# Patient Record
Sex: Male | Born: 1935 | Race: White | Hispanic: No | Marital: Married | Smoking: Never smoker
Health system: Southern US, Community
[De-identification: ages and names within clinical notes are randomized; demographics above are authoritative.]

## PROBLEM LIST (undated history)

## (undated) DIAGNOSIS — H2513 Age-related nuclear cataract, bilateral: Secondary | ICD-10-CM

## (undated) DIAGNOSIS — K661 Hemoperitoneum: Secondary | ICD-10-CM

## (undated) DIAGNOSIS — M1990 Unspecified osteoarthritis, unspecified site: Secondary | ICD-10-CM

## (undated) DIAGNOSIS — IMO0001 Reserved for inherently not codable concepts without codable children: Secondary | ICD-10-CM

## (undated) DIAGNOSIS — K5730 Diverticulosis of large intestine without perforation or abscess without bleeding: Secondary | ICD-10-CM

## (undated) DIAGNOSIS — N200 Calculus of kidney: Secondary | ICD-10-CM

## (undated) DIAGNOSIS — Z85820 Personal history of malignant melanoma of skin: Secondary | ICD-10-CM

## (undated) DIAGNOSIS — R1032 Left lower quadrant pain: Secondary | ICD-10-CM

## (undated) DIAGNOSIS — N402 Nodular prostate without lower urinary tract symptoms: Secondary | ICD-10-CM

## (undated) DIAGNOSIS — C439 Malignant melanoma of skin, unspecified: Secondary | ICD-10-CM

## (undated) DIAGNOSIS — I82409 Acute embolism and thrombosis of unspecified deep veins of unspecified lower extremity: Secondary | ICD-10-CM

## (undated) DIAGNOSIS — E785 Hyperlipidemia, unspecified: Secondary | ICD-10-CM

## (undated) DIAGNOSIS — I2510 Atherosclerotic heart disease of native coronary artery without angina pectoris: Secondary | ICD-10-CM

## (undated) DIAGNOSIS — I639 Cerebral infarction, unspecified: Secondary | ICD-10-CM

## (undated) DIAGNOSIS — I219 Acute myocardial infarction, unspecified: Secondary | ICD-10-CM

## (undated) DIAGNOSIS — I10 Essential (primary) hypertension: Secondary | ICD-10-CM

## (undated) HISTORY — DX: Unspecified osteoarthritis, unspecified site: M19.90

## (undated) HISTORY — DX: Hyperlipidemia, unspecified: E78.5

## (undated) HISTORY — DX: Left lower quadrant pain: R10.32

## (undated) HISTORY — DX: Nodular prostate without lower urinary tract symptoms: N40.2

## (undated) HISTORY — DX: Malignant melanoma of skin, unspecified: C43.9

## (undated) HISTORY — PX: OTHER SURGICAL HISTORY: SHX169

## (undated) HISTORY — DX: Atherosclerotic heart disease of native coronary artery without angina pectoris: I25.10

## (undated) HISTORY — PX: ROTATOR CUFF REPAIR: SHX139

## (undated) HISTORY — PX: CATARACT EXTRACTION: SUR2

## (undated) HISTORY — DX: Acute embolism and thrombosis of unspecified deep veins of unspecified lower extremity: I82.409

## (undated) HISTORY — PX: CARDIOVASCULAR STRESS TEST: SHX262

## (undated) HISTORY — PX: BILATERAL ANTERIOR TOTAL HIP ARTHROPLASTY: SHX5567

## (undated) HISTORY — DX: Personal history of malignant melanoma of skin: Z85.820

## (undated) HISTORY — DX: Acute myocardial infarction, unspecified: I21.9

## (undated) HISTORY — DX: Calculus of kidney: N20.0

## (undated) HISTORY — DX: Age-related nuclear cataract, bilateral: H25.13

## (undated) HISTORY — DX: Cerebral infarction, unspecified: I63.9

## (undated) HISTORY — DX: Essential (primary) hypertension: I10

## (undated) HISTORY — DX: Diverticulosis of large intestine without perforation or abscess without bleeding: K57.30

## (undated) HISTORY — DX: Hemoperitoneum: K66.1

## (undated) HISTORY — DX: Reserved for inherently not codable concepts without codable children: IMO0001

---

## 1988-01-18 HISTORY — PX: OTHER SURGICAL HISTORY: SHX169

## 1990-08-19 HISTORY — PX: MELANOMA EXCISION: SHX5266

## 1999-05-29 ENCOUNTER — Ambulatory Visit (HOSPITAL_BASED_OUTPATIENT_CLINIC_OR_DEPARTMENT_OTHER): Admission: RE | Admit: 1999-05-29 | Discharge: 1999-05-29 | Payer: Self-pay | Admitting: Orthopedic Surgery

## 2006-10-14 DIAGNOSIS — I219 Acute myocardial infarction, unspecified: Secondary | ICD-10-CM

## 2006-10-14 HISTORY — DX: Acute myocardial infarction, unspecified: I21.9

## 2006-10-14 HISTORY — PX: OTHER SURGICAL HISTORY: SHX169

## 2007-08-20 DIAGNOSIS — K661 Hemoperitoneum: Secondary | ICD-10-CM

## 2007-08-20 DIAGNOSIS — K683 Retroperitoneal hematoma: Secondary | ICD-10-CM

## 2007-08-20 DIAGNOSIS — I82409 Acute embolism and thrombosis of unspecified deep veins of unspecified lower extremity: Secondary | ICD-10-CM

## 2007-08-20 HISTORY — DX: Retroperitoneal hematoma: K68.3

## 2007-08-20 HISTORY — DX: Hemoperitoneum: K66.1

## 2007-08-20 HISTORY — DX: Acute embolism and thrombosis of unspecified deep veins of unspecified lower extremity: I82.409

## 2008-05-20 ENCOUNTER — Ambulatory Visit (HOSPITAL_COMMUNITY): Admission: RE | Admit: 2008-05-20 | Discharge: 2008-05-20 | Payer: Self-pay | Admitting: General Surgery

## 2008-05-20 HISTORY — PX: HERNIA REPAIR: SHX51

## 2008-05-23 ENCOUNTER — Encounter: Admission: RE | Admit: 2008-05-23 | Discharge: 2008-05-23 | Payer: Self-pay | Admitting: General Surgery

## 2008-05-24 ENCOUNTER — Inpatient Hospital Stay (HOSPITAL_COMMUNITY): Admission: EM | Admit: 2008-05-24 | Discharge: 2008-05-28 | Payer: Self-pay | Admitting: Emergency Medicine

## 2008-07-25 ENCOUNTER — Encounter: Payer: Self-pay | Admitting: Internal Medicine

## 2008-11-30 DIAGNOSIS — I2510 Atherosclerotic heart disease of native coronary artery without angina pectoris: Secondary | ICD-10-CM | POA: Insufficient documentation

## 2008-12-02 ENCOUNTER — Encounter: Admission: RE | Admit: 2008-12-02 | Discharge: 2008-12-02 | Payer: Self-pay | Admitting: General Surgery

## 2008-12-02 ENCOUNTER — Encounter: Payer: Self-pay | Admitting: Cardiology

## 2008-12-02 ENCOUNTER — Ambulatory Visit: Payer: Self-pay | Admitting: Cardiology

## 2008-12-02 DIAGNOSIS — Z8679 Personal history of other diseases of the circulatory system: Secondary | ICD-10-CM | POA: Insufficient documentation

## 2008-12-02 DIAGNOSIS — Z8672 Personal history of thrombophlebitis: Secondary | ICD-10-CM | POA: Insufficient documentation

## 2008-12-02 DIAGNOSIS — M169 Osteoarthritis of hip, unspecified: Secondary | ICD-10-CM

## 2008-12-02 DIAGNOSIS — Z87442 Personal history of urinary calculi: Secondary | ICD-10-CM

## 2008-12-02 DIAGNOSIS — Z85820 Personal history of malignant melanoma of skin: Secondary | ICD-10-CM

## 2008-12-02 HISTORY — DX: Personal history of malignant melanoma of skin: Z85.820

## 2008-12-07 ENCOUNTER — Ambulatory Visit: Payer: Self-pay | Admitting: Internal Medicine

## 2008-12-07 ENCOUNTER — Telehealth (INDEPENDENT_AMBULATORY_CARE_PROVIDER_SITE_OTHER): Payer: Self-pay | Admitting: *Deleted

## 2008-12-08 ENCOUNTER — Encounter: Payer: Self-pay | Admitting: Cardiovascular Disease

## 2008-12-08 ENCOUNTER — Ambulatory Visit: Payer: Self-pay

## 2008-12-15 ENCOUNTER — Ambulatory Visit: Payer: Self-pay | Admitting: Internal Medicine

## 2009-01-09 ENCOUNTER — Encounter: Payer: Self-pay | Admitting: Internal Medicine

## 2009-01-30 ENCOUNTER — Ambulatory Visit: Payer: Self-pay | Admitting: Internal Medicine

## 2009-02-07 ENCOUNTER — Encounter: Payer: Self-pay | Admitting: Internal Medicine

## 2009-02-07 ENCOUNTER — Ambulatory Visit: Payer: Self-pay | Admitting: Internal Medicine

## 2009-02-07 HISTORY — PX: COLONOSCOPY W/ POLYPECTOMY: SHX1380

## 2009-02-07 LAB — HM COLONOSCOPY: HM Colonoscopy: NORMAL

## 2009-02-12 ENCOUNTER — Encounter: Payer: Self-pay | Admitting: Internal Medicine

## 2009-03-02 ENCOUNTER — Ambulatory Visit: Payer: Self-pay | Admitting: Internal Medicine

## 2009-03-02 ENCOUNTER — Ambulatory Visit: Payer: Self-pay | Admitting: Cardiology

## 2009-03-02 DIAGNOSIS — I739 Peripheral vascular disease, unspecified: Secondary | ICD-10-CM

## 2009-03-02 LAB — CONVERTED CEMR LAB
ALT: 26 units/L (ref 0–53)
AST: 26 units/L (ref 0–37)
Albumin: 3.9 g/dL (ref 3.5–5.2)
BUN: 16 mg/dL (ref 6–23)
CO2: 29 meq/L (ref 19–32)
Chloride: 107 meq/L (ref 96–112)
GFR calc non Af Amer: 99.89 mL/min (ref >60)
Potassium: 4.7 meq/L (ref 3.5–5.1)
Total Protein: 6.7 g/dL (ref 6.0–8.3)
Triglycerides: 153 mg/dL — ABNORMAL HIGH (ref 0.0–149.0)
VLDL: 30.6 mg/dL (ref 0.0–40.0)

## 2009-03-16 ENCOUNTER — Ambulatory Visit: Payer: Self-pay | Admitting: Internal Medicine

## 2009-03-16 DIAGNOSIS — M79609 Pain in unspecified limb: Secondary | ICD-10-CM

## 2009-04-11 ENCOUNTER — Ambulatory Visit: Payer: Self-pay | Admitting: Cardiology

## 2009-04-11 ENCOUNTER — Ambulatory Visit: Payer: Self-pay

## 2009-04-13 ENCOUNTER — Encounter (INDEPENDENT_AMBULATORY_CARE_PROVIDER_SITE_OTHER): Payer: Self-pay | Admitting: *Deleted

## 2009-04-21 ENCOUNTER — Telehealth: Payer: Self-pay | Admitting: Cardiology

## 2009-04-26 ENCOUNTER — Telehealth (INDEPENDENT_AMBULATORY_CARE_PROVIDER_SITE_OTHER): Payer: Self-pay | Admitting: *Deleted

## 2009-06-19 ENCOUNTER — Ambulatory Visit: Payer: Self-pay | Admitting: Internal Medicine

## 2009-07-18 ENCOUNTER — Telehealth: Payer: Self-pay | Admitting: Cardiology

## 2009-07-26 ENCOUNTER — Ambulatory Visit: Payer: Self-pay | Admitting: Cardiology

## 2009-07-26 ENCOUNTER — Encounter: Payer: Self-pay | Admitting: Physician Assistant

## 2009-07-26 DIAGNOSIS — R0602 Shortness of breath: Secondary | ICD-10-CM

## 2009-09-07 ENCOUNTER — Ambulatory Visit: Payer: Self-pay | Admitting: Cardiology

## 2009-09-08 ENCOUNTER — Encounter (INDEPENDENT_AMBULATORY_CARE_PROVIDER_SITE_OTHER): Payer: Self-pay | Admitting: *Deleted

## 2009-09-08 LAB — CONVERTED CEMR LAB
ALT: 24 units/L (ref 0–53)
AST: 22 units/L (ref 0–37)
Alkaline Phosphatase: 60 units/L (ref 39–117)
Bilirubin, Direct: 0 mg/dL (ref 0.0–0.3)
HDL: 44.1 mg/dL (ref >39.00)
VLDL: 39.4 mg/dL (ref 0.0–40.0)

## 2009-09-20 ENCOUNTER — Ambulatory Visit: Payer: Self-pay | Admitting: Internal Medicine

## 2009-09-20 DIAGNOSIS — J309 Allergic rhinitis, unspecified: Secondary | ICD-10-CM | POA: Insufficient documentation

## 2009-12-06 ENCOUNTER — Encounter: Payer: Self-pay | Admitting: Internal Medicine

## 2010-01-16 ENCOUNTER — Telehealth: Payer: Self-pay | Admitting: Cardiology

## 2010-01-30 ENCOUNTER — Encounter: Payer: Self-pay | Admitting: Cardiology

## 2010-01-30 ENCOUNTER — Ambulatory Visit: Payer: Self-pay | Admitting: Cardiology

## 2010-01-30 DIAGNOSIS — R609 Edema, unspecified: Secondary | ICD-10-CM

## 2010-02-05 ENCOUNTER — Encounter: Admission: RE | Admit: 2010-02-05 | Discharge: 2010-02-05 | Payer: Self-pay | Admitting: Cardiology

## 2010-02-05 ENCOUNTER — Encounter: Payer: Self-pay | Admitting: Cardiology

## 2010-02-22 ENCOUNTER — Telehealth (INDEPENDENT_AMBULATORY_CARE_PROVIDER_SITE_OTHER): Payer: Self-pay | Admitting: *Deleted

## 2010-02-22 ENCOUNTER — Encounter: Payer: Self-pay | Admitting: Cardiology

## 2010-02-28 ENCOUNTER — Encounter: Payer: Self-pay | Admitting: Internal Medicine

## 2010-03-13 ENCOUNTER — Inpatient Hospital Stay (HOSPITAL_COMMUNITY): Admission: RE | Admit: 2010-03-13 | Discharge: 2010-03-19 | Payer: Self-pay | Admitting: Orthopedic Surgery

## 2010-04-30 ENCOUNTER — Encounter: Payer: Self-pay | Admitting: Internal Medicine

## 2010-05-21 ENCOUNTER — Telehealth: Payer: Self-pay | Admitting: Cardiology

## 2010-05-23 ENCOUNTER — Encounter: Payer: Self-pay | Admitting: Cardiology

## 2010-05-23 ENCOUNTER — Ambulatory Visit: Payer: Self-pay | Admitting: Cardiology

## 2010-06-11 ENCOUNTER — Encounter: Payer: Self-pay | Admitting: Internal Medicine

## 2010-06-27 ENCOUNTER — Telehealth: Payer: Self-pay | Admitting: Cardiology

## 2010-07-09 ENCOUNTER — Encounter: Payer: Self-pay | Admitting: Internal Medicine

## 2010-07-19 ENCOUNTER — Telehealth: Payer: Self-pay | Admitting: Family Medicine

## 2010-07-31 ENCOUNTER — Ambulatory Visit (HOSPITAL_COMMUNITY)
Admission: RE | Admit: 2010-07-31 | Discharge: 2010-07-31 | Payer: Self-pay | Source: Home / Self Care | Attending: Orthopedic Surgery | Admitting: Orthopedic Surgery

## 2010-08-22 ENCOUNTER — Encounter: Payer: Self-pay | Admitting: Family Medicine

## 2010-08-24 ENCOUNTER — Other Ambulatory Visit: Payer: Self-pay | Admitting: Family Medicine

## 2010-08-24 ENCOUNTER — Encounter: Payer: Self-pay | Admitting: Family Medicine

## 2010-08-24 ENCOUNTER — Telehealth: Payer: Self-pay | Admitting: Family Medicine

## 2010-08-24 ENCOUNTER — Ambulatory Visit
Admission: RE | Admit: 2010-08-24 | Discharge: 2010-08-24 | Payer: Self-pay | Source: Home / Self Care | Attending: Family Medicine | Admitting: Family Medicine

## 2010-08-24 LAB — HEPATIC FUNCTION PANEL
ALT: 20 U/L (ref 0–53)
AST: 24 U/L (ref 0–37)
Albumin: 3.7 g/dL (ref 3.5–5.2)
Alkaline Phosphatase: 60 U/L (ref 39–117)
Bilirubin, Direct: 0.1 mg/dL (ref 0.0–0.3)
Total Bilirubin: 0.8 mg/dL (ref 0.3–1.2)
Total Protein: 6.3 g/dL (ref 6.0–8.3)

## 2010-08-24 LAB — LIPID PANEL
Cholesterol: 123 mg/dL (ref 0–200)
HDL: 32.7 mg/dL — ABNORMAL LOW (ref >39.00)
LDL Cholesterol: 68 mg/dL (ref 0–99)
Total CHOL/HDL Ratio: 4
Triglycerides: 110 mg/dL (ref 0.0–149.0)
VLDL: 22 mg/dL (ref 0.0–40.0)

## 2010-08-24 LAB — CBC WITH DIFFERENTIAL/PLATELET
Basophils Absolute: 0 10*3/uL (ref 0.0–0.1)
Basophils Relative: 0.3 % (ref 0.0–3.0)
Eosinophils Absolute: 0.1 10*3/uL (ref 0.0–0.7)
Eosinophils Relative: 2 % (ref 0.0–5.0)
HCT: 44.1 % (ref 39.0–52.0)
Hemoglobin: 15.3 g/dL (ref 13.0–17.0)
Lymphocytes Relative: 34.3 % (ref 12.0–46.0)
Lymphs Abs: 2.1 10*3/uL (ref 0.7–4.0)
MCHC: 34.6 g/dL (ref 30.0–36.0)
MCV: 101.3 fl — ABNORMAL HIGH (ref 78.0–100.0)
Monocytes Absolute: 0.6 10*3/uL (ref 0.1–1.0)
Monocytes Relative: 9.1 % (ref 3.0–12.0)
Neutro Abs: 3.3 10*3/uL (ref 1.4–7.7)
Neutrophils Relative %: 54.3 % (ref 43.0–77.0)
Platelets: 181 10*3/uL (ref 150.0–400.0)
RBC: 4.36 Mil/uL (ref 4.22–5.81)
RDW: 14.2 % (ref 11.5–14.6)
WBC: 6.1 10*3/uL (ref 4.5–10.5)

## 2010-08-24 LAB — BASIC METABOLIC PANEL
BUN: 19 mg/dL (ref 6–23)
CO2: 28 mEq/L (ref 19–32)
Calcium: 9.2 mg/dL (ref 8.4–10.5)
Chloride: 106 mEq/L (ref 96–112)
Creatinine, Ser: 0.8 mg/dL (ref 0.4–1.5)
GFR: 100.96 mL/min (ref >60.00)
Glucose, Bld: 79 mg/dL (ref 70–99)
Potassium: 4.2 mEq/L (ref 3.5–5.1)
Sodium: 143 mEq/L (ref 135–145)

## 2010-08-28 ENCOUNTER — Encounter: Payer: Self-pay | Admitting: Family Medicine

## 2010-08-28 ENCOUNTER — Ambulatory Visit
Admission: RE | Admit: 2010-08-28 | Discharge: 2010-08-28 | Payer: Self-pay | Source: Home / Self Care | Attending: Family Medicine | Admitting: Family Medicine

## 2010-09-07 ENCOUNTER — Encounter: Payer: Self-pay | Admitting: Internal Medicine

## 2010-09-16 LAB — CONVERTED CEMR LAB
ALT: 25 units/L (ref 0–53)
AST: 26 units/L (ref 0–37)
Albumin: 4 g/dL (ref 3.5–5.2)
Alkaline Phosphatase: 58 units/L (ref 39–117)
HDL: 39 mg/dL — ABNORMAL LOW (ref >39.00)
LDL Cholesterol: 62 mg/dL (ref 0–99)
Total CHOL/HDL Ratio: 3
Triglycerides: 90 mg/dL (ref 0.0–149.0)

## 2010-09-18 NOTE — Progress Notes (Signed)
Summary: RX REFILL  Phone Note Refill Request Message from:  Patient on June 27, 2010 12:29 PM  Refills Requested: Medication #1:  METOPROLOL TARTRATE 50 MG TABS one  tab by mouth two times a day PT WIFE BETTY STATES SHE NEEDS A NURSE TO CALL THIS IN TO [facility]# [phone].    Method Requested: Telephone to Pharmacy Initial call taken by: Roe Coombs,  June 27, 2010 12:30 PM  Follow-up for Phone Call        called pharmacy gave pt 12 refills

## 2010-09-18 NOTE — Letter (Signed)
Summary: [facility]  [facility]   Imported By: Maryln Gottron 05/29/2010 12:58:14  _____________________________________________________________________  External Attachment:    Type:   Image     Comment:   External Document

## 2010-09-18 NOTE — Letter (Signed)
Summary: [facility]  [facility]   Imported By: Maryln Gottron 03/09/2010 11:27:03  _____________________________________________________________________  External Attachment:    Type:   Image     Comment:   External Document

## 2010-09-18 NOTE — Letter (Signed)
Summary: [facility]  [facility]   Imported By: Maryln Gottron 12/15/2009 13:26:41  _____________________________________________________________________  External Attachment:    Type:   Image     Comment:   External Document

## 2010-09-18 NOTE — Assessment & Plan Note (Signed)
Summary: lft ear swollen and sore/cjr   Vital Signs:  Patient profile:   77 year old male Weight:      210 pounds BMI:     31.12 Temp:     97.8 degrees F Pulse rate:   60 / minute Resp:     12 per minute BP sitting:   120 / 76  (left arm)  Vitals Entered By: Gladis Riffle, RN (September 20, 2009 11:08 AM) CC: c/o left ear swelling Is Patient Diabetic? No Comments had a cold that has resloved on it own   CC:  c/o left ear swelling.  History of Present Illness: 2 day hx of swollen left ear--- wife says was swollen shut yesterday better last night and this a.m. no pain  also complains of sinus congestion--PNDr --5 years  Preventive Screening-Counseling & Management  Alcohol-Tobacco     Smoking Status: never  Medications Prior to Update: 1)  Bayer Aspirin 325 Mg Tabs (Aspirin) .... One By Mouth Daily 2)  Metoprolol Tartrate 50 Mg Tabs (Metoprolol Tartrate) .... One  Tab By Mouth Two Times A Day 3)  Multivitamins   Tabs (Multiple Vitamin) .Marland Kitchen.. 1 Tab By Mouth Once Daily 4)  Ibuprofen 800 Mg Tabs (Ibuprofen) .... Take One Tablet By Mouth Up To 3 Times A Day With Food 5)  Crestor 5 Mg  Tabs (Rosuvastatin Calcium) .... Take 1/2 Tab By Mouth Daily  Allergies: 1)  ! Sulfa 2)  ! 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 status post coronary bypass graft-10/14/06 (LIMA to the LAD, saphenous vein graft to the diagonal, saphenous vein graft to the mid obtuse marginal, saphenous vein graft placed in a sequential fashion to the RV branch of the right coronary artery and distal right coronary artery) laparoscopic repair   [place]  filter 10/09 (DVT s/p hernia repair) carpal tunnel repair B Trigger finger x 3 melanoma---1992  Family History: Last updated: 12/15/2008 Positive for premature CAD mother---cancer---"male" 43 yo father 104 yo MI Brothers with early heart dzs  Social History: Last updated: 12/02/2008 Tobacco Use - No.  Alcohol Use - no Married   Risk Factors: Smoking Status: never (09/20/2009)  Physical Exam  General:  well-developed well-nourished in no acute distress. Skin is warm and dry. Head:  normocephalic and atraumatic.   Eyes:  pupils equal and pupils round.   Ears:  tympanic membranes bilaterally appear normal.  There is some swelling of the left external auditory canal.  No tenderness to palpation.  Pain of the left ear is also mildly swollen without erythema or pain to palpation. Neck:  supple no bruits. No thyromegaly noted. Chest Wall:  No deformities, masses, tenderness or gynecomastia noted. Lungs:  clear to auscultation   Impression & Recommendations:  Problem # 1:  OTITIS EXTERNA, UNSPEC. (ICD-380.10) seems to be self improving i will feel better with antibiotic side effects discussed.  Call is symptoms worsen or persist.  Problem # 2:  RHINITIS (ICD-477.9)  trial flonase  His updated medication list for this problem includes:    Fluticasone Propionate 50 Mcg/act Susp (Fluticasone propionate) .Marland Kitchen... 2 sprays each nostril once daily  Complete Medication List: 1)  Bayer Aspirin 325 Mg Tabs (Aspirin) .... One by mouth  daily 2)  Metoprolol Tartrate 50 Mg Tabs (Metoprolol tartrate) .... One  tab by mouth two times a day 3)  Multivitamins Tabs (Multiple vitamin) .Marland Kitchen.. 1 tab by mouth once daily 4)  Ibuprofen 800 Mg Tabs (Ibuprofen) .... Take one tablet by mouth up to 3 times a day with food 5)  Crestor 5 Mg Tabs (Rosuvastatin calcium) .... Take 1/2 tab by mouth daily 6)  Fluticasone Propionate 50 Mcg/act Susp (Fluticasone propionate) .... 2 sprays each nostril once  daily  Patient Instructions: 1)  . Prescriptions: FLUTICASONE PROPIONATE 50 MCG/ACT  SUSP (FLUTICASONE PROPIONATE) 2 sprays each nostril once daily  #1 vial x 3   Entered and Authorized by:   Birdie Sons MD   Signed by:   Birdie Sons MD on 09/20/2009   Method used:   Electronically to        [facility] [address] [phone]* (retail)       [address]       [place], [place]  [address]       Ph: [phone]       Fax: [phone]   RxID:   [id number] DOXYCYCLINE HYCLATE 100 MG CAPS (DOXYCYCLINE HYCLATE) Take 1 tab twice a day  #10 x 0   Entered and Authorized by:   Birdie Sons MD   Signed by:   Birdie Sons MD on 09/20/2009   Method used:   Electronically to        [facility] [address] [phone]* (retail)       [address]       [place], [place]  [address]       Ph: [phone]       Fax: [phone]   RxID:   [id number]

## 2010-09-18 NOTE — Progress Notes (Signed)
Summary: re bloodwork at next visit  Phone Note Call from Patient   Caller: Patient Reason for Call: Talk to Nurse Summary of Call: pt calling to see if he needs bloodwork at next visit [phone] Initial call taken by: Glynda Jaeger,  May 21, 2010 3:37 PM  Follow-up for Phone Call        Pt recently had lipid,liver tests in June,11 and wanted to know if he needed to come in tomorrow for lab work.  I will forward to Dr. Jens Som & [facility]. Mylo Red RN     Appended Document: re bloodwork at next visit no bloodwork now  Appended Document: re bloodwork at next visit PT AWARE./CY

## 2010-09-18 NOTE — Letter (Signed)
Summary: [facility]  [facility], Main Office  [address]   [place], [place] [address]   Phone: [phone]  Fax: [phone]     September 08, 2009 MRN: [id number]   Bernard Byrd [address] [place], [place]  [address]   Dear [name],  We have reviewed your cholesterol results.  They are as follows:     Total Cholesterol:    153 (Desirable: less than 200)       HDL  Cholesterol:     44.10  (Desirable: greater than 40 for men and 50 for women)       LDL Cholesterol:       70  (Desirable: less than 100 for low risk and less than 70 for moderate to high risk)       Triglycerides:       197.0  (Desirable: less than 150)  Our recommendations include:These numbers look good. Continue on the same medicine. Liver function is normal. Take care, Dr. Darel Hong.    Call our office at the number listed above if you have any questions.  Lowering your LDL cholesterol is important, but it is only one of a large number of "risk factors" that may indicate that you are at risk for heart disease, stroke or other complications of hardening of the arteries.  Other risk factors include:   A.  Cigarette Smoking* B.  High Blood Pressure* C.  Obesity* D.   Low HDL Cholesterol (see yours above)* E.   Diabetes Mellitus (higher risk if your is uncontrolled) F.  Family history of premature heart disease G.  Previous history of stroke or cardiovascular disease    *These are risk factors YOU HAVE CONTROL OVER.  For more information, visit .  There is now evidence that lowering the TOTAL CHOLESTEROL AND LDL CHOLESTEROL can reduce the risk of heart disease.  The American Heart Association recommends the following guidelines for the treatment of elevated cholesterol:  1.  If there is now current heart disease and less than two risk factors, TOTAL CHOLESTEROL should be less than 200 and LDL CHOLESTEROL should be less than 100. 2.  If there is current heart disease or two  or more risk factors, TOTAL CHOLESTEROL should be less than 200 and LDL CHOLESTEROL should be less than 70.  A diet low in cholesterol, saturated fat, and calories is the cornerstone of treatment for elevated cholesterol.  Cessation of smoking and exercise are also important in the management of elevated cholesterol and preventing vascular disease.  Studies have shown that 30 to 60 minutes of physical activity most days can help lower blood pressure, lower cholesterol, and keep your weight at a healthy level.  Drug therapy is used when cholesterol levels do not respond to therapeutic lifestyle changes (smoking cessation, diet, and exercise) and remains unacceptably high.  If medication is started, it is important to have you levels checked periodically to evaluate the need for further treatment options.  Thank you,    [facility] Team

## 2010-09-18 NOTE — Letter (Signed)
Summary: [facility]  [facility]   Imported By: Maryln Gottron 07/18/2010 13:07:10  _____________________________________________________________________  External Attachment:    Type:   Image     Comment:   External Document

## 2010-09-18 NOTE — Assessment & Plan Note (Signed)
Summary: [facility]   Visit Type:  Follow-up  CC:  No complains.  History of Present Illness: 77 yo male with H/O CAD for followup.  H/o inferior MI s/p CABG x 5 in 2/08 w/ ? CVA    post CABG.  Prevoious to this, no major health problems.  Previously cared for at [facility]. in April of 2010 A CT angiogram showed no pulmonary embolus. A myoview showed a prior small inferobasal infarct but no ischemia. His ejection fraction was 57%. ABIs in August of 2010 were normal.  I last saw him in June of 2011. Since then the patient denies any dyspnea on exertion, orthopnea, PND, pedal edema, palpitations, syncope or chest pain.   Current Medications (verified): 1)  Bayer Aspirin 325 Mg Tabs (Aspirin) .... One By Mouth Daily 2)  Metoprolol Tartrate 50 Mg Tabs (Metoprolol Tartrate) .... One  Tab By Mouth Two Times A Day 3)  Multivitamins   Tabs (Multiple Vitamin) .Marland Kitchen.. 1 Tab By Mouth Once Daily 4)  Ibuprofen 800 Mg Tabs (Ibuprofen) .... Take One Tablet By Mouth Up To 3 Times A Day With Food 5)  Crestor 5 Mg  Tabs (Rosuvastatin Calcium) .... Take 1/2 Tab By Mouth Daily 6)  Furosemide 20 Mg Tabs (Furosemide) .... Take One Tablet By Mouth Daily As Needed For Swelling. On Hold 7)  Stool Softener 100 Mg Caps (Docusate Sodium) .... Two Times A Day  Allergies: 1)  ! Sulfa 2)  ! Pcn  Past History:  Past Medical History: Reviewed history from 01/30/2010 and no changes required. 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 status post coronary bypass graft-10/14/06 (LIMA to the LAD, saphenous vein graft to the diagonal, saphenous vein graft to the mid obtuse marginal, saphenous vein graft placed in a sequential fashion to the RV branch of the right coronary artery  and distal right coronary artery) laparoscopic repair   [place] filter 10/09 (DVT s/p hernia repair) carpal tunnel repair B Trigger finger x 3 melanoma---1992 status post recent left hip replacement  Social History: Reviewed history from 12/02/2008 and no changes required. Tobacco Use - No.  Alcohol Use - no Married   Review of Systems       no fevers or chills, productive cough, hemoptysis, dysphasia, odynophagia, melena, hematochezia, dysuria, hematuria, rash, seizure activity, orthopnea, PND, pedal edema, claudication. Remaining systems are negative.   Vital Signs:  Patient profile:   77 year old male Height:      69 inches Weight:      209.75 pounds BMI:     31.09 Pulse rate:   61 / minute Pulse rhythm:   regular Resp:     18 per minute BP sitting:   139 / 78  (left arm) Cuff size:   large  Vitals Entered By: Vikki Ports (May 23, 2010 3:28 PM)  Physical Exam  General:  Well-developed well-nourished in no acute distress.  Skin is warm and dry.  HEENT is normal.  Neck is supple. No thyromegaly.  Chest is clear to auscultation with normal expansion.  Cardiovascular exam is regular rate and rhythm.  Abdominal exam nontender or distended. No masses palpated. Extremities show trace edema. neuro grossly intact    EKG  Procedure date:  05/23/2010  Findings:      Normal sinus rhythm at a rate 9.  Prior inferior infarct.  Impression & Recommendations:  Problem # 1:  HYPERTENSION, BENIGN (ICD-401.1) Blood pressure controlled. Continue present medications. His updated medication list for this problem includes:    Bayer Aspirin 325 Mg Tabs (Aspirin) ..... One by mouth daily    Metoprolol Tartrate 50 Mg Tabs (Metoprolol tartrate) ..... One  tab by mouth two times a day    Furosemide 20 Mg Tabs (Furosemide) .Marland Kitchen... Take one tablet by mouth daily as needed for swelling. on hold  Problem # 2:  HYPERCHOLESTEROLEMIA-PURE (ICD-272.0) Continue statin. His updated  medication list for this problem includes:    Crestor 5 Mg Tabs (Rosuvastatin calcium) .Marland Kitchen... Take 1/2 tab by mouth daily  Problem # 3:  CAD (ICD-414.00) continue aspirin, beta blocker and statin. His updated medication list for this problem includes:    Bayer Aspirin 325 Mg Tabs (Aspirin) ..... One by mouth daily    Metoprolol Tartrate 50 Mg Tabs (Metoprolol tartrate) ..... One  tab by mouth two times a day  Patient Instructions: 1)  Your physician recommends that you schedule a follow-up appointment in: 6 MONTHS

## 2010-09-18 NOTE — Assessment & Plan Note (Signed)
Summary: per check out/sf   CC:  follow up.  History of Present Illness: 77 yo male with H/O CAD for followup.  H/o inferior MI s/p CABG x 5 in 2/08 w/ ? CVA    post CABG.  Prevoious to this, no major health problems.  Previously cared for at [facility]. in April of 2010 A CT angiogram showed no pulmonary embolus. A myoview showed a prior small inferobasal infarct but no ischemia. His ejection fraction was 57%. ABIs in August of 2010 were normal. He was seen in the office in December with elevated blood pressure and his Lopressor was increased. There is also complaints of dyspnea. A Myoview was recommended but he declined. I last saw him in July of 2010. Since then he has decreased mobility from hip pain. However he denies any dyspnea on exertion, orthopnea, PND, pedal edema, palpitations, syncope or chest pain.  Current Medications (verified): 1)  Bayer Aspirin 325 Mg Tabs (Aspirin) .... One By Mouth Daily 2)  Metoprolol Tartrate 50 Mg Tabs (Metoprolol Tartrate) .... One  Tab By Mouth Two Times A Day 3)  Multivitamins   Tabs (Multiple Vitamin) .Marland Kitchen.. 1 Tab By Mouth Once Daily 4)  Ibuprofen 800 Mg Tabs (Ibuprofen) .... Take One Tablet By Mouth Up To 3 Times A Day With Food 5)  Crestor 5 Mg  Tabs (Rosuvastatin Calcium) .... Take 1/2 Tab By Mouth Daily  Allergies: 1)  ! Sulfa 2)  ! Pcn  Past History:  Past Medical History: Current Problems:  DVT (ICD-453.40) Following recent inguinal hernia repair CAD (ICD-414.00) Nephrolithiasis possible post operative CVA following coronary bypass and graft--2008 hyperlipidemia Hypertension  Past Surgical History: Reviewed history from 12/15/2008 and no changes required. hernia repair, open, x3 times  hip replacement  rotator cuff repair. status post coronary bypass graft-10/14/06 (LIMA to the LAD, saphenous vein graft to the diagonal, saphenous vein graft to the mid obtuse marginal, saphenous vein graft placed in a sequential fashion to the RV  branch of the right coronary artery and distal right coronary artery) laparoscopic repair   [place] filter 10/09 (DVT s/p hernia repair) carpal tunnel repair B Trigger finger x 3 melanoma---1992  Social History: Reviewed history from 12/02/2008 and no changes required. Tobacco Use - No.  Alcohol Use - no Married   Review of Systems       Problems with hip pain but no fevers or chills, productive cough, hemoptysis, dysphasia, odynophagia, melena, hematochezia, dysuria, hematuria, rash, seizure activity, orthopnea, PND, pedal edema, claudication. Remaining systems are negative.   Vital Signs:  Patient profile:   77 year old male Height:      69 inches Weight:      211 pounds BMI:     31.27 Pulse rate:   58 / minute Resp:     12 per minute BP sitting:   126 / 74  (left arm)  Vitals Entered By: Kem Parkinson (September 07, 2009 10:29 AM)  Physical Exam  General:  Well-developed well-nourished in no acute distress.  Skin is warm and dry.  HEENT is normal.  Neck is supple. No thyromegaly.  Chest is clear to auscultation with normal expansion.  Cardiovascular exam is regular rate and rhythm.  Abdominal exam nontender or distended. No masses palpated. Extremities show no edema. neuro grossly intact    Impression & Recommendations:  Problem # 1:  DYSPNEA (ICD-786.05) This has resolved. Previous Myoview low risk. No further workup at this time. His updated medication list for this problem includes:  Bayer Aspirin 325 Mg Tabs (Aspirin) ..... One by mouth daily    Metoprolol Tartrate 50 Mg Tabs (Metoprolol tartrate) ..... One  tab by mouth two times a day  His updated medication list for this problem includes:    Bayer Aspirin 325 Mg Tabs (Aspirin) ..... One by mouth daily    Metoprolol Tartrate 50 Mg Tabs (Metoprolol tartrate) ..... One  tab by mouth two times a day  Problem # 2:  HYPERTENSION, BENIGN (ICD-401.1) Blood pressure much better on present dose of  Lopressor. He will follow this at home and we will have additional medications as needed. His updated medication list for this problem includes:    Bayer Aspirin 325 Mg Tabs (Aspirin) ..... One by mouth daily    Metoprolol Tartrate 50 Mg Tabs (Metoprolol tartrate) ..... One  tab by mouth two times a day  Problem # 3:  HYPERCHOLESTEROLEMIA-PURE (ICD-272.0) Continue statin. Check lipids and liver. His updated medication list for this problem includes:    Crestor 5 Mg Tabs (Rosuvastatin calcium) .Marland Kitchen... Take 1/2 tab by mouth daily  Orders: TLB-Lipid Panel (80061-LIPID) TLB-Hepatic/Liver Function Pnl (80076-HEPATIC)  Problem # 4:  CAD (ICD-414.00) Continue aspirin, beta blocker and statin. His updated medication list for this problem includes:    Bayer Aspirin 325 Mg Tabs (Aspirin) ..... One by mouth daily    Metoprolol Tartrate 50 Mg Tabs (Metoprolol tartrate) ..... One  tab by mouth two times a day  Orders: TLB-Hepatic/Liver Function Pnl (80076-HEPATIC)  Problem # 5:  CEREBROVASCULAR ACCIDENT, HX OF (ICD-V12.50)  Patient Instructions: 1)  Your physician recommends that you schedule a follow-up appointment in: 6 months. 2)  Your physician recommends that you return for lab work UE:AVWUJ Lipid panel, LFT.

## 2010-09-18 NOTE — Progress Notes (Signed)
Summary: swollen leg and red ankle  Phone Note Call from Patient Call back at [phone]   Caller: Spouse/Betty Summary of Call: Pt wife calling regarding the pt legs swollen and anle is red Initial call taken by: Judie Grieve,  Jan 16, 2010 10:37 AM  Follow-up for Phone Call        spoke with pt wife, she states for the last 2 weeks the pt has had swelling in his left foot and ankle. they are swollen in the am when he gets up and by the end of the day he can not get his shoes on. this is the harvest leg from bypass syrgery. pt denies SOB unless walking up stairs. he has decreased his salt intake. there is also a red area on the legs near the ankle. the skin is not broken, it is just red. the leg is painful today. will discuss with dr taylor the DOD. Deliah Goody, RN  Jan 16, 2010 10:58 AM   Additional Follow-up for Phone Call Additional follow up Details #1::        discussed with dr taylor, pt to cont to watch salt in diet. he will elevated the left leg when sitting. pt to take lasix 20mg  once daily for the next 2 days. follow up appt made. pt instructed to contact dr swords if the spot on his leg changes. he will call the office with further problems Deliah Goody, RN  Jan 16, 2010 3:18 PM     New/Updated Medications: FUROSEMIDE 20 MG TABS (FUROSEMIDE) Take one tablet by mouth daily x next two days Prescriptions: FUROSEMIDE 20 MG TABS (FUROSEMIDE) Take one tablet by mouth daily x next two days  #2 x 0   Entered by:   Deliah Goody, RN   Authorized by:   Ferman Hamming, MD, [facility]   Signed by:   Deliah Goody, RN on 01/16/2010   Method used:   Electronically to        [facility]  [phone]* (retail)       [address]       [place], [place]  [address]       Ph: [phone] or [phone]       Fax: [phone]   RxID:   [id number]

## 2010-09-18 NOTE — Progress Notes (Signed)
  Release recieved from [facility] Faxed LOV,LAbs over to [place] @ [phone], sent around to [facility] for her to complete "Can Pt Stop Asprin"  [facility]  February 22, 2010 8:50 AM

## 2010-09-18 NOTE — Letter (Signed)
Summary: [facility]  [facility]   Imported By: Maryln Gottron 06/14/2010 10:05:57  _____________________________________________________________________  External Attachment:    Type:   Image     Comment:   External Document

## 2010-09-18 NOTE — Letter (Signed)
Summary: [facility]   [facility]   Imported By: Roderic Ovens 03/01/2010 15:08:49  _____________________________________________________________________  External Attachment:    Type:   Image     Comment:   External Document

## 2010-09-18 NOTE — Progress Notes (Signed)
Summary: Transfer PCP  Phone Note Call from Patient Call back at Home Phone [phone]   Caller: Spouse Summary of Call: Pt would like to transfer PCP to [facility] location, is that okay? Initial call taken by: Lannette Donath,  July 19, 2010 9:58 AM  Follow-up for Phone Call        Okay by me if okay by Dr. Cato Mulligan. Follow-up by: Michell Heinrich M.D.,  July 19, 2010 11:02 AM  Additional Follow-up for Phone Call Additional follow up Details #1::        ok Additional Follow-up by: Birdie Sons MD,  July 19, 2010 3:18 PM    Additional Follow-up for Phone Call Additional follow up Details #2::    Pt has been scheduled 08/28/10 with Dr Milinda Cave Follow-up by: Lannette Donath,  July 20, 2010 11:28 AM

## 2010-09-18 NOTE — Assessment & Plan Note (Signed)
Summary: [facility]   Visit Type:  Follow-up  CC:  Bilateral leg edema .  History of Present Illness: 77 yo male with H/O CAD for followup.  H/o inferior MI s/p CABG x 5 in 2/08 w/ ? CVA    post CABG.  Prevoious to this, no major health problems.  Previously cared for at [facility] in April of 2010 A CT angiogram showed no pulmonary embolus. A myoview showed a prior small inferobasal infarct but no ischemia. His ejection fraction was 57%. ABIs in August of 2010 were normal.  last saw him in January of 2011. Since then he has decreased mobility from hip pain. However he denies any dyspnea, chest pain or syncope. He has developed increased swelling particularly of the left lower extremity. There is no claudication.  Current Medications (verified): 1)  Bayer Aspirin 325 Mg Tabs (Aspirin) .... One By Mouth Daily 2)  Metoprolol Tartrate 50 Mg Tabs (Metoprolol Tartrate) .... One  Tab By Mouth Two Times A Day 3)  Multivitamins   Tabs (Multiple Vitamin) .Marland Kitchen.. 1 Tab By Mouth Once Daily 4)  Ibuprofen 800 Mg Tabs (Ibuprofen) .... Take One Tablet By Mouth Up To 3 Times A Day With Food 5)  Crestor 5 Mg  Tabs (Rosuvastatin Calcium) .... Take 1/2 Tab By Mouth Daily  Allergies: 1)  ! Sulfa 2)  ! Pcn  Past History:  Past Medical History: DVT (ICD-453.40) Following inguinal hernia repair CAD (ICD-414.00) Nephrolithiasis possible post operative CVA following coronary bypass and graft--2008 hyperlipidemia Hypertension  Past Surgical History: Reviewed history from 12/15/2008 and no changes required. hernia repair, open, x3 times  hip replacement  rotator cuff repair. 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 Tobacco Use - No.  Alcohol Use - no Married   Review of Systems       Problems with hip arthralgias but no fevers or chills, productive cough, hemoptysis, dysphasia, odynophagia, melena, hematochezia, dysuria, hematuria, rash, seizure activity, orthopnea, PND,  claudication. Remaining systems are negative.   Vital Signs:  Patient profile:   77 year old male Height:      69 inches Weight:      213.25 pounds BMI:     31.61 Pulse rate:   57 / minute Pulse rhythm:   regular Resp:     18 per minute BP sitting:   119 / 73  (left arm) Cuff size:   large  Vitals Entered By: Vikki Ports (January 30, 2010 2:53 PM)  Physical Exam  General:  Well-developed well-nourished in no acute distress.  Skin is warm and dry.  HEENT is normal.  Neck is supple. No thyromegaly.  Chest is clear to auscultation with normal expansion.  Cardiovascular exam is regular rate and rhythm.  Abdominal exam nontender or distended. No masses palpated. Extremities show 1+  edema left greater than right. neuro grossly intact    Impression & Recommendations:  Problem # 1:  EDEMA (ICD-782.3)  This is most likely related to prior DVT in left lower extremity and vein harvesting. However I will schedule an ultrasound to exclude DVT. I've instructed  him to maintain a low-sodium diet and to keep his feet elevated. We may need to at compression hose in the future. I will Lasix 20 mg p.o. daily as needed. Check bmet in one week.  Orders: Venous Duplex Lower Extremity (Venous Duplex Lower)  Problem # 2:  HYPERTENSION, BENIGN (ICD-401.1)  Blood pressure controlled on present medications. Will continue. The following medications were removed from the medication list:    Furosemide 20 Mg Tabs (Furosemide) .Marland Kitchen... Take one tablet by mouth daily x next two  days His updated medication list for this problem includes:    Bayer Aspirin 325 Mg Tabs (Aspirin) ..... One by mouth daily    Metoprolol Tartrate 50 Mg Tabs (Metoprolol tartrate) ..... One  tab by mouth two times a day    Furosemide 20 Mg Tabs (Furosemide) .Marland Kitchen... Take one tablet by mouth daily as needed for swelling  Orders: T-Basic Metabolic Panel [phone])  Problem # 3:  HYPERCHOLESTEROLEMIA-PURE (ICD-272.0)  Continue statin. Check lipids and liver. His updated medication list for this problem includes:    Crestor 5 Mg Tabs (Rosuvastatin calcium) .Marland Kitchen... Take 1/2 tab by mouth daily  Orders: T-Hepatic Function [phone]) T-Lipid Profile [phone])  Problem # 4:  CAD (ICD-414.00) Continue aspirin, beta blocker and statin. His updated medication list for this problem includes:    Bayer Aspirin 325 Mg Tabs (Aspirin) ..... One by mouth daily    Metoprolol Tartrate 50 Mg Tabs (Metoprolol tartrate) ..... One  tab by mouth two times a day  Patient Instructions: 1)  Your physician recommends that you schedule a follow-up appointment in: [facility] JULY 27 @ 2:30PM IN [place] 2)  Your physician recommends that you return for lab work in:ONE WEEK IN [facility] 3)  Your physician has recommended you make the following change in your medication: START FUROSEMIDE 20MG  ONE TABLET ONCE DAILY AS NEEDED FOR SWELLING 4)  Your physician has requested that you have a lower or upper extremity venous duplex.  This test is an ultrasound of the veins in the legs or arms.  It looks at venous blood flow that carries blood from the heart to the legs or arms.  Allow one hour for a Lower Venous exam.  Allow thirty minutes for an Upper Venous exam. There are no restrictions or special instructions.MONDAY 02-05-10 @ 9AM AT [facility] IN [place] Prescriptions: FUROSEMIDE 20 MG TABS (FUROSEMIDE) Take one tablet by mouth daily as needed for swelling  #30 x 12   Entered by:   Deliah Goody, RN   Authorized by:   Ferman Hamming, MD, [facility]   Signed by:   Deliah Goody, RN on 01/30/2010   Method used:   Electronically to        [facility]  [phone]* (retail)       [address]       [place], [place]  [address]       Ph: [phone] or [phone]       Fax: [phone]   RxID:   [id number]

## 2010-09-20 NOTE — Assessment & Plan Note (Signed)
Summary: Transfer care-needs CPX/dt   Vital Signs:  Patient profile:   77 year old male Height:      69 inches Weight:      216.75 pounds BMI:     32.12 O2 Sat:      95 % on Room air Temp:     97.5 degrees F oral Pulse rate:   63 / minute BP sitting:   116 / 76  (right arm) Cuff size:   regular  Vitals Entered By: Francee Piccolo CMA Duncan Dull) (August 28, 2010 9:08 AM)  O2 Flow:  Room air CC: New to establish, left hip pain following replacement//SP Is Patient Diabetic? No   History of Present Illness: 77 y/o WM here to establish care/transfer from Dr. Cato Mulligan. Feels good, just having intermittent right hip pain with walking 80+ steps --has had right hip replacement 5+ yrs ago and this pain has been present 3+ yrs.  Full eval by his orthopedist has been done and he has been offered a revision surgery that he's currently still considering.  Doesn't like pain meds, takes only occasional ibuprofen 800mg . Complains about problems getting wt off, understands that he needs to stop eating out so much (4 times per week) and needs to get more active (but his hip prevents this right now).  He has never tried stationary bike so we discussed this as an exercise option that may not bring on his hip pain.  Reviewed med list: he is NOT taking lasix anymore at all so this will be deleted from his list.  Current Medications (verified): 1)  Bayer Aspirin 325 Mg Tabs (Aspirin) .... One By Mouth Daily 2)  Metoprolol Tartrate 50 Mg Tabs (Metoprolol Tartrate) .... One  Tab By Mouth Two Times A Day 3)  Multivitamins   Tabs (Multiple Vitamin) .Marland Kitchen.. 1 Tab By Mouth Once Daily 4)  Ibuprofen 800 Mg Tabs (Ibuprofen) .... Take One Tablet By Mouth Up To 3 Times A Day With Food 5)  Crestor 5 Mg  Tabs (Rosuvastatin Calcium) .... Take 1/2 Tab By Mouth Daily 6)  Furosemide 20 Mg Tabs (Furosemide) .... Take One Tablet By Mouth Daily As Needed For Swelling. On Hold 7)  Stool Softener 100 Mg Caps (Docusate Sodium) ....  Two Times A Day  Allergies (verified): 1)  ! Sulfa 2)  ! Pcn  Past History:  Past medical, surgical, family and social histories (including risk factors) reviewed, and no changes noted (except as noted below).  Past Medical History: Reviewed history from 01/30/2010 and no changes required. DVT (ICD-453.40) Following inguinal hernia repair CAD (ICD-414.00) Nephrolithiasis possible post operative CVA following coronary bypass and graft--2008 hyperlipidemia Hypertension  Past Surgical History: Reviewed history from 08/24/2010 and no changes required. hernia repair, open, x3 times  hip replacement, bilateral (right was 2004)  rotator cuff repair. 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 Positive for premature CAD mother---cancer---"male" 29 yo father 77 yo [place] Brothers with early heart dzs  Social History: Reviewed history from 12/02/2008 and no changes required. Tobacco Use - No.  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  Review of Systems  The patient denies anorexia, fever, weight loss, weight gain, vision loss, decreased hearing, hoarseness, chest pain, syncope, dyspnea on exertion, peripheral edema, prolonged cough, headaches, hemoptysis, abdominal pain, melena, hematochezia, severe indigestion/heartburn, hematuria, incontinence, genital sores, muscle weakness, suspicious skin lesions, transient blindness, depression, unusual weight  change, abnormal bleeding, enlarged lymph nodes, angioedema, breast masses, and testicular masses.    Physical Exam  General:  VS: noted, all normal. Gen: Alert, well appearing, oriented x 4. HEENT: Scalp without lesions or hair loss.  Ears: EACs clear, normal epithelium.  TMs with good light reflex and landmarks bilaterally.  Eyes: no injection, icteris, swelling, or exudate.  EOMI, PERRLA. Nose: no drainage or turbinate edema/swelling.  No inection or focal lesion.  Mouth: lips without lesion/swelling.  Oral mucosa pink and moist.  Dentition intact and without obvious caries or gingival swelling.  Oropharynx without erythema, exudate, or swelling.  Neck: supple.  No lymphadenopathy, thyromegaly, or mass. Chest: symmetric expansion, with nonlabored respirations.  Clear and equal breath sounds in all lung fields.   CV: RRR, no m/r/g.  Peripheral pulses 2+/symmetric. EXT: no clubbing or cyanosis.  He has 2+ pitting LE edema  bilat from mid tibia level down, with extensive flaking and freckling c/w chronic venous stasis dermatitis.  No erythema or tenderness.  PT pulses 1+ bilat.   Impression & Recommendations:  Problem # 1:  ENCOUNTER FOR LONG-TERM USE OF OTHER MEDICATIONS (ICD-V58.69) Assessment Unchanged Doing fine, continue current meds.   Reviewed all recent labs---excellent.  He gets prostate f/u next week with his urologist, Dr. Earlene Plater. F/u in office 65mo, earlier if needed.  We'll give him zostavax at that time.  Problem # 2:  SPECIAL SCREENING FOR MALIGNANT NEOPLASMS COLON (ICD-V76.51) Assessment: Comment Only  Gave hemoccults x 3 to take home today.  Due for colonoscopy rpt 2015.  Orders: Hemoccult Cards -3 specimans (take home) (16109)  Problem # 3:  OSTEOARTHRITIS, HIP, RIGHT (ICD-715.95) Assessment: Deteriorated He's considering his orthopedist's offer of revision surgery.  Until then, he'll continue as needed ibuprofen---discussed GI bleeding risks and the possibility of  alternative meds in future if needed.  Encouraged non weight-bearing exercise as tolerated and DECREASE number of restaurant meals he eats.  His updated medication list for this problem includes:        Ibuprofen 800 Mg Tabs (Ibuprofen) .Marland Kitchen... Take one tablet by mouth up to 3 times a day with food  Problem # 4:  CAD (ICD-414.00) Assessment: Unchanged Stable.  Has had f/u with cardiologist, Dr. Jens Som, as recently as 05/2010. All labs this week were excellent.  His updated medication list for this problem includes:    Bayer Aspirin 325 Mg Tabs (Aspirin) ..... One by mouth daily    Metoprolol Tartrate 50 Mg Tabs (Metoprolol tartrate) ..... One  tab by mouth two times a day      Problem # 5:  HYPERCHOLESTEROLEMIA-PURE (ICD-272.0) Assessment: Unchanged Labs this week were great.  Will recheck routine transaminases at f/u in 6 months. His updated medication list for this problem includes:    Crestor 5 Mg Tabs (Rosuvastatin calcium) .Marland Kitchen... Take 1/2 tab by mouth daily  Complete Medication List: 1)  Bayer Aspirin 325 Mg Tabs (Aspirin) .... One by mouth daily 2)  Metoprolol Tartrate 50 Mg Tabs (Metoprolol tartrate) .... One  tab by mouth two times a day 3)  Multivitamins Tabs (Multiple vitamin) .Marland KitchenMarland KitchenMarland Kitchen  1 tab by mouth once daily 4)  Ibuprofen 800 Mg Tabs (Ibuprofen) .... Take one tablet by mouth up to 3 times a day with food 5)  Crestor 5 Mg Tabs (Rosuvastatin calcium) .... Take 1/2 tab by mouth daily 6)  Stool Softener 100 Mg Caps (Docusate sodium) .... Two times a day  Patient Instructions: 1)  F/u appt 6 months, no labs needed prior.   Orders Added: 1)  Hemoccult Cards -3 specimans (take home) [82272] 2)  Est. Patient Level III [16109]

## 2010-09-20 NOTE — Miscellaneous (Signed)
  Clinical Lists Changes  Observations: Added new observation of PAST SURG HX: hernia repair, open, x3 times  hip replacement, bilateral (right was 2004)  rotator cuff repair. 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 status post coronary bypass graft-10/14/06 (LIMA to the LAD, saphenous vein graft to the diagonal, saphenous vein graft to the mid obtuse marginal, saphenous vein graft placed in a sequential fashion to the RV branch of the right coronary artery and distal right coronary artery) laparoscopic repair   Greenfield filter 10/09 (DVT s/p hernia repair) carpal tunnel repair B Trigger finger x 3 melanoma---1992 status post recent left hip replacement

## 2010-09-20 NOTE — Miscellaneous (Signed)
  Clinical Lists Changes  Observations: Added new observation of PAST MED HX: DVT (ICD-453.40) Following inguinal hernia repair CAD (ICD-414.00) Nephrolithiasis possible post operative CVA following coronary bypass and graft--2008 hyperlipidemia Hypertension Prostate nodule---multiple benign biopsies (Dr. Earlene Plater) (08/28/2010 10:42) Added new observation of PRIMARY MD: Nicoletta Ba, MD (08/28/2010 10:42)       Past History:  Past Medical History: DVT (ICD-453.40) Following inguinal hernia repair CAD (ICD-414.00) Nephrolithiasis possible post operative CVA following coronary bypass and graft--2008 hyperlipidemia Hypertension Prostate nodule---multiple benign biopsies (Dr. Earlene Plater)

## 2010-09-20 NOTE — Progress Notes (Signed)
Summary: Lab Results  Phone Note Other Incoming   Summary of Call: Please notify: his recent lab tests were all excellent.  Continue all current meds.  We can review them in detail if he wants when he comes in to see me next week. Initial call taken by: Michell Heinrich M.D.,  August 24, 2010 2:58 PM  Follow-up for Phone Call        pt aware of above. Follow-up by: Francee Piccolo CMA Duncan Dull),  August 24, 2010 4:27 PM

## 2010-09-20 NOTE — Miscellaneous (Signed)
  Clinical Lists Changes  Orders: Added new Test order of TLB-BMP (Basic Metabolic Panel-BMET) (80048-METABOL) - Signed Added new Test order of TLB-CBC Platelet - w/Differential (85025-CBCD) - Signed Added new Test order of TLB-Hepatic/Liver Function Pnl (80076-HEPATIC) - Signed Added new Test order of TLB-Lipid Panel (80061-LIPID) - Signed

## 2010-09-20 NOTE — Letter (Signed)
Summary: [facility]  [facility]   Imported By: Lester Morse 08/29/2010 08:30:33  _____________________________________________________________________  External Attachment:    Type:   Image     Comment:   External Document

## 2010-09-26 NOTE — Consult Note (Signed)
Summary: [facility]  [facility]   Imported By: Maryln Gottron 09/18/2010 12:38:23  _____________________________________________________________________  External Attachment:    Type:   Image     Comment:   External Document

## 2010-10-15 ENCOUNTER — Telehealth: Payer: Self-pay | Admitting: Family Medicine

## 2010-10-25 ENCOUNTER — Encounter: Payer: Self-pay | Admitting: *Deleted

## 2010-10-25 NOTE — Progress Notes (Signed)
Summary: Zostavax Vaccine follow up  Phone Note Outgoing Call   Call placed by: Georga Bora,  October 15, 2010 10:37 AM Call placed to: Patient Summary of Call: Notified Bernard Byrd that he is not eligible through transactrx for his Zostavax his carrier may not participate with transactrx. I did explain he could still have the vaccine through his physician but at an out of pocket cost pricing and billing were discuss also.  Initial call taken by: Georga Bora,  October 15, 2010 10:46 AM

## 2010-11-03 LAB — CBC
Hemoglobin: 9.6 g/dL — ABNORMAL LOW (ref 13.0–17.0)
MCH: 34.5 pg — ABNORMAL HIGH (ref 26.0–34.0)
MCHC: 34.3 g/dL (ref 30.0–36.0)
Platelets: 113 10*3/uL — ABNORMAL LOW (ref 150–400)
Platelets: 134 10*3/uL — ABNORMAL LOW (ref 150–400)
Platelets: 159 10*3/uL (ref 150–400)
RBC: 3.08 MIL/uL — ABNORMAL LOW (ref 4.22–5.81)
RBC: 3.58 MIL/uL — ABNORMAL LOW (ref 4.22–5.81)
RDW: 13.8 % (ref 11.5–15.5)
WBC: 10.5 10*3/uL (ref 4.0–10.5)
WBC: 12.9 10*3/uL — ABNORMAL HIGH (ref 4.0–10.5)
WBC: 8.4 10*3/uL (ref 4.0–10.5)

## 2010-11-03 LAB — PROTIME-INR
INR: 1.02 (ref 0.00–1.49)
INR: 1.26 (ref 0.00–1.49)
INR: 1.52 — ABNORMAL HIGH (ref 0.00–1.49)
INR: 1.83 — ABNORMAL HIGH (ref 0.00–1.49)
Prothrombin Time: 15.7 seconds — ABNORMAL HIGH (ref 11.6–15.2)
Prothrombin Time: 18.2 seconds — ABNORMAL HIGH (ref 11.6–15.2)
Prothrombin Time: 21 seconds — ABNORMAL HIGH (ref 11.6–15.2)
Prothrombin Time: 23.5 seconds — ABNORMAL HIGH (ref 11.6–15.2)
Prothrombin Time: 24.2 seconds — ABNORMAL HIGH (ref 11.6–15.2)

## 2010-11-03 LAB — BASIC METABOLIC PANEL
BUN: 8 mg/dL (ref 6–23)
Calcium: 7.6 mg/dL — ABNORMAL LOW (ref 8.4–10.5)
Calcium: 8.1 mg/dL — ABNORMAL LOW (ref 8.4–10.5)
Creatinine, Ser: 0.79 mg/dL (ref 0.4–1.5)
Creatinine, Ser: 0.86 mg/dL (ref 0.4–1.5)
GFR calc Af Amer: >60 mL/min (ref >60)
GFR calc Af Amer: >60 mL/min (ref >60)
GFR calc Af Amer: >60 mL/min (ref >60)
GFR calc non Af Amer: >60 mL/min (ref >60)
GFR calc non Af Amer: >60 mL/min (ref >60)
GFR calc non Af Amer: >60 mL/min (ref >60)
Glucose, Bld: 104 mg/dL — ABNORMAL HIGH (ref 70–99)
Sodium: 135 mEq/L (ref 135–145)
Sodium: 135 mEq/L (ref 135–145)

## 2010-11-03 LAB — COMPREHENSIVE METABOLIC PANEL
Albumin: 4 g/dL (ref 3.5–5.2)
Alkaline Phosphatase: 60 U/L (ref 39–117)
BUN: 11 mg/dL (ref 6–23)
CO2: 27 mEq/L (ref 19–32)
Chloride: 103 mEq/L (ref 96–112)
Creatinine, Ser: 0.85 mg/dL (ref 0.4–1.5)
GFR calc non Af Amer: >60 mL/min (ref >60)
Glucose, Bld: 81 mg/dL (ref 70–99)
Potassium: 4.6 mEq/L (ref 3.5–5.1)
Total Bilirubin: 0.6 mg/dL (ref 0.3–1.2)

## 2010-11-03 LAB — APTT: aPTT: 27 seconds (ref 24–37)

## 2010-11-03 LAB — URINALYSIS, ROUTINE W REFLEX MICROSCOPIC
Glucose, UA: NEGATIVE mg/dL
Hgb urine dipstick: NEGATIVE
Ketones, ur: NEGATIVE mg/dL
Protein, ur: NEGATIVE mg/dL
pH: 7.5 (ref 5.0–8.0)

## 2010-11-03 LAB — GLUCOSE, CAPILLARY

## 2010-11-03 LAB — ABO/RH: ABO/RH(D): O POS

## 2010-11-03 LAB — TYPE AND SCREEN: ABO/RH(D): O POS

## 2010-11-07 ENCOUNTER — Encounter: Payer: Self-pay | Admitting: Cardiology

## 2010-11-07 ENCOUNTER — Ambulatory Visit (INDEPENDENT_AMBULATORY_CARE_PROVIDER_SITE_OTHER): Payer: Medicare Other | Admitting: Cardiology

## 2010-11-07 VITALS — BP 130/72 | HR 68 | Resp 18 | Ht 72.0 in | Wt 217.0 lb

## 2010-11-07 DIAGNOSIS — I10 Essential (primary) hypertension: Secondary | ICD-10-CM

## 2010-11-07 DIAGNOSIS — I2510 Atherosclerotic heart disease of native coronary artery without angina pectoris: Secondary | ICD-10-CM

## 2010-11-07 DIAGNOSIS — E7800 Pure hypercholesterolemia, unspecified: Secondary | ICD-10-CM

## 2010-11-07 MED ORDER — METOPROLOL TARTRATE 50 MG PO TABS: 50.0000 mg | ORAL_TABLET | Freq: Two times a day (BID) | ORAL | Status: DC

## 2010-11-07 NOTE — Assessment & Plan Note (Signed)
Blood pressure controlled on present meds; will continue; K and renal function followed by primary care.

## 2010-11-07 NOTE — Patient Instructions (Signed)
Your physician recommends that you schedule a follow-up appointment in: ONE YEAR 

## 2010-11-07 NOTE — Assessment & Plan Note (Signed)
Continue ASA and statin. Plan myoview when he returns in one year.

## 2010-11-07 NOTE — Progress Notes (Signed)
HPI: 77 yo male with H/O CAD for followup.  H/o inferior MI s/p CABG x 5 in 2/08 w/ ? CVA  post CABG.  Prevoious to this, no major health problems.  Previously cared for at [facility]. In April of 2010 A CT angiogram showed no pulmonary embolus. A myoview showed a prior small inferobasal infarct but no ischemia. His ejection fraction was 57%. ABIs in August of 2010 were normal.  I last saw him in October of 2011. Since then the patient denies any dyspnea on exertion, orthopnea, PND, pedal edema, palpitations, syncope or chest pain.   Current Outpatient Prescriptions  Medication Sig Dispense Refill  . aspirin 325 MG tablet Take 325 mg by mouth daily.        Marland Kitchen docusate sodium (STOOL SOFTENER) 100 MG capsule Take 100 mg by mouth 2 (two) times daily.        . furosemide (LASIX) 20 MG tablet Take 20 mg by mouth as needed.        Marland Kitchen ibuprofen (ADVIL,MOTRIN) 800 MG tablet Take 800 mg by mouth 3 (three) times daily with meals.        . metoprolol (LOPRESSOR) 50 MG tablet Take 1 tablet (50 mg total) by mouth 2 (two) times daily.  180 tablet  4  . multivitamin (THERAGRAN) per tablet Take 1 tablet by mouth daily.        . rosuvastatin (CRESTOR) 5 MG tablet Take 5 mg by mouth daily. Take 1/2 tab daily       . DISCONTD: metoprolol (LOPRESSOR) 50 MG tablet Take 50 mg by mouth 2 (two) times daily.          Past Medical History  Diagnosis Date  . DVT (deep venous thrombosis)     following inguinal hernia repair  . CAD (coronary artery disease)   . Nephrolithiasis   . Hyperlipidemia   . Hypertension   . Prostate nodule     multiple benign biopsies (Dr Earlene Plater)    Past Surgical History  Procedure Date  . Hernia repair     open, X 3  . Hip replacement bilateral     right was 2004  . Rotator cuff repair   . Status post coronary bypass graft 10/14/06     (LIMA to the LAD, saphenous vein graft to the diagonal, saphenous vein graft to the mid obtuse marginal, saphenous vein graft placed in a sequential  fashion ot the RV branch of the right coronary artery and distal right coronary artery  . Carpal tunnel repair b   . Trigger finger x 3   . Status post recent left hip replacement     History   Social History  . Marital Status: Married    Spouse Name: N/A    Number of Children: N/A  . Years of Education: N/A   Occupational History  . Not on file.   Social History Main Topics  . Smoking status: Never Smoker   . Smokeless tobacco: Not on file  . Alcohol Use: No  . Drug Use: Not on file  . Sexually Active: Not on file   Other Topics Concern  . Not on file   Social History Narrative  . No narrative on file    ROS: no fevers or chills, productive cough, hemoptysis, dysphasia, odynophagia, melena, hematochezia, dysuria, hematuria, rash, seizure activity, orthopnea, PND, pedal edema, claudication. Remaining systems are negative.  Physical Exam: Well-developed well-nourished in no acute distress.  Skin is warm and dry.  HEENT  is normal.  Neck is supple. No thyromegaly.  Chest is clear to auscultation with normal expansion.  Cardiovascular exam is regular rate and rhythm.  Abdominal exam nontender or distended. No masses palpated. Extremities show no edema. neuro grossly intact  ECG - NSR with no ST changes

## 2010-11-07 NOTE — Assessment & Plan Note (Signed)
Continue statin; lipids and liver followed by primary care. 

## 2010-11-28 ENCOUNTER — Ambulatory Visit: Payer: Self-pay | Admitting: Cardiology

## 2010-12-24 ENCOUNTER — Other Ambulatory Visit: Payer: Self-pay | Admitting: Internal Medicine

## 2010-12-24 NOTE — Telephone Encounter (Signed)
Last OV 08/28/10-follow up in 6 months. Last filled 04/27/10-#90 x 0. OK to fill?

## 2011-01-01 NOTE — Op Note (Signed)
NAME:  Bernard Byrd, Bernard Byrd               ACCOUNT NO.:  [id number]   MEDICAL RECORD NO.:  [id number]          PATIENT TYPE:  AMB   LOCATION:  DAY                          FACILITY:  [facility]   PHYSICIAN:  Sharlet Salina T. Hoxworth, M.D.DATE OF BIRTH:  10-25-1930   DATE OF PROCEDURE:  05/20/2008  DATE OF DISCHARGE:                               OPERATIVE REPORT   POSTOPERATIVE DIAGNOSIS:  Recurrent left inguinal hernia.   POSTOPERATIVE DIAGNOSIS:  Recurrent left inguinal hernia.   SURGICAL PROCEDURES:  Laparoscopic repair of recurrent left inguinal  hernia.   SURGEON:  Lorne Skeens. Hoxworth, M.D.   ANESTHESIA:  General.   BRIEF HISTORY:  [name] is a 77 year old male with a history of 2  previous left inguinal hernia repairs approximately 50 and 30 years ago.  He now presents with a recurrent intermittent uncomfortable bulge in the  left groin and examination confirms a recurrent left inguinal hernia  that is reducible with some slight difficulty and is tender.  I have  recommended proceeding with laparoscopic repair.  The nature of the  procedure, its indications, risks of bleeding, infection, recurrence of  visceral injury have been discussed understood.  He is now brought to  the operating room for this procedure.   DESCRIPTION OF OPERATION:  The patient was brought to the operating room  and placed in supine position on the operating table and general  endotracheal anesthesia was induced.  A Foley catheter was placed.  The  abdomen and groins were widely sterilely prepped and draped.  He  received preoperative IV antibiotics.  The correct patient and procedure  were verified.  Local anesthesia was used to infiltrate the trocar  sites.  A 1-cm incision was made just below the umbilicus and dissection  carried down to the anterior fascia.  This was incised transversely just  to the left of the midline and the medial edge of the left rectus muscle  identified, dissected laterally, and the  preperitoneal space entered.  The balloon catheter was passed with its tip into the space and then  down along the midline to the pubic symphysis.  Then under laparoscopic  vision, it was inflated, with good bilateral deployment of the balloon.  This was left in place for about 2 minutes for hemostasis.  The balloon  was then deflated, the structural balloon trocar inflated and CO2  pressure applied.  Laparoscopy showed that there was some mild bleeding  on the left side in the area of the epigastric vessels and around the  pubis.  This was suctioned and a couple of small bleeding points  cauterized, and this was controlled.  There was moderately good  dissection but there were still quite a few adhesions to the anterior  abdominal wall around the epigastric vessels laterally.  The pubic  symphysis was identified and Cooper's ligament cleared down to the iliac  vessels, which were identified and carefully protected.  The epigastric  vessels were identified.  They had been dissected down a little bit and  they were dissected back up anteriorly and then preperitoneal fat and  peritoneum dissected posteriorly  off the anterior abdominal wall,  working well out to the anterior-superior iliac spine to the level of  the umbilicus.  The peritoneal edge was then followed back medially and  there was a good-sized indirect sac present.  The sac was dissected to  the level of the internal ring.  It was actually quite thin, and was  opened.  It contained incarcerated omentum, which was pulled down out of  the defect and reduced back into the abdominal cavity.  This allowed  definition of the peritoneum, which was dissected away from cord  structures at the level of the internal ring.  There was some old suture  material here.  Finally, the sac was able to be brought down completely  out of the internal ring, dissected off of the cord structures, and  dissected well back posteriorly.  There was no  evidence of direct  defect.  Following this, a large left-sided Bard 3-D Max piece of mesh  was introduced into the preperitoneal space and oriented.  It was then  tacked initially to the pubic symphysis and along Cooper's ligament.  The mesh was deployed well out laterally toward the anterior-superior  iliac spine and working along the superior edge, being able to feel the  tacker through the anterior abdominal wall.  The superior edge was  tacked back, working medially and finally the medial edge was secured.  This provided nice broad coverage of the direct and indirect spaces and  the peritoneum and hernia sac were seen to be well posterior to the  mesh.  The operative site was inspected for hemostasis.  All CO2 was  evacuated and trocars removed.  The fascial defect and the umbilicus was  closed with figure-of-eight 0 Vicryl and the skin closed with  subcuticular Monocryl and Dermabond.  Sponge and needle counts were  correct.  The patient was taken to recovery in good condition.      Lorne Skeens. Hoxworth, M.D.  Electronically Signed     BTH/MEDQ  D:  05/20/2008  T:  05/20/2008  Job:  [id number]

## 2011-01-04 NOTE — Discharge Summary (Signed)
NAME:  Bernard Byrd, BETHEL NO.:  [id number]   MEDICAL RECORD NO.:  [id number]          PATIENT TYPE:  INP   LOCATION:  1614                         FACILITY:  [facility]   PHYSICIAN:  Sharlet Salina T. Hoxworth, M.D.DATE OF BIRTH:  September 01, 1930   DATE OF ADMISSION:  05/24/2008  DATE OF DISCHARGE:  05/28/2008                               DISCHARGE SUMMARY   DISCHARGE DIAGNOSES:  1. Left popliteal deep venous thrombosis following laparoscopic hernia      repair.  2. Left lower quadrant hematoma following anticoagulation.   OPERATIONS AND PROCEDURES:  Placement of IVC filter, interventional  radiology, May 25, 2008.   HISTORY OF PRESENT ILLNESS:  Ms. Capelli is a 77 year old male status post  laparoscopic repair of a recurrent left inguinal hernia on May 20, 2008.  The patient initially did well and was discharged home the day of  surgery.  However, he was seen on May 23, 2008, with the acute onset  of left knee and calf pain and ultrasound showed a left popliteal DVT.  We elected to treat this with outpatient Lovenox and the patient had his  initial dose of 1 mg/kg of Lovenox subcutaneously last night.  He then  presented with acute onset of severe left lower quadrant pain today.   PAST MEDICAL HISTORY:  Previous inguinal hernia repair, open, x3 times  prior to this.  He has also had hip replacement and rotator cuff repair.  Medically, he is followed for stable coronary disease status post  coronary bypass graft.   MEDICATIONS ON ADMISSION:  1. Lovenox 95 mg given last night subcutaneously.  2. Metoprolol 50 mg a day.  3. Simvastatin 20 daily.  4. Plavix 75 a day.  5. Aspirin 81 daily.   ALLERGIES:  1. PENICILLIN.  2. SULFA.   Note, his Plavix was stopped prior to his surgery temporarily.   PHYSICAL EXAM:  He is afebrile.  VITAL SIGNS:  Within normal limits.  GENERAL:  Well-developed male, uncomfortable.  Pertinent findings noted to the abdomen which showed  contusions along  the lower abdominal wall with some moderate left lower quadrant  tenderness and fullness.  EXTREMITIES: Well perfused without swelling or  tenderness.   LABORATORY:  Showed a white count of 10,000, hemoglobin 12.6.  CT scan  of the abdomen and pelvis showed a left lower quadrant retroperitoneal  hematoma in the area of his laparoscopic hernia repair.   HOSPITAL COURSE:  Patient was admitted.  Due to bleeding, I felt he  could not be anticoagulated for his DVT and his Lovenox was stopped and  an IVC filter was placed by interventional radiology on May 25, 2008.  He tolerated this procedure well.  By May 25, 2008, his pain was  significantly decreased.  Hemoglobin fell to 9.8 from 12.6, white count  remained normal.  His abdomen became less tender.  Followup hemoglobin  on May 26, 2008, was 9.1.  At this point, he had mild soreness,  benign abdominal exam.  Hematocrit was increased to 28 on May 27, 2008, and he continued to feel better and was discharged  home on May 28, 2008.   DISCHARGE MEDICATIONS:  The same as admission and he is going to restart  his Plavix.   FOLLOWUP:  Is to be in my office in 2 weeks.      Lorne Skeens. Hoxworth, M.D.  Electronically Signed     BTH/MEDQ  D:  06/20/2008  T:  06/20/2008  Job:  [id number]

## 2011-03-24 ENCOUNTER — Other Ambulatory Visit: Payer: Self-pay | Admitting: Family Medicine

## 2011-03-25 NOTE — Telephone Encounter (Signed)
Last seen on 08/28/10, pt should have had follow up in July 2012.  RX last given on 12/24/10-#90 no refills.

## 2011-03-25 NOTE — Telephone Encounter (Signed)
I authorized one RF of the #90.  Needs routine office f/u w/me sometime in the next 3 months.  Thx--PM

## 2011-03-26 NOTE — Telephone Encounter (Signed)
Notified pt that RX has been sent to pharm, but he will need office visit before more refills can be given.  Pt voices understanding and is agreeable.

## 2011-04-03 ENCOUNTER — Telehealth: Payer: Self-pay | Admitting: Internal Medicine

## 2011-04-03 NOTE — Telephone Encounter (Signed)
Patient is requesting shingles vaccine Rx to be sent to [facility] in [facility]

## 2011-04-04 ENCOUNTER — Other Ambulatory Visit: Payer: Self-pay | Admitting: Family Medicine

## 2011-04-04 MED ORDER — ZOSTER VACCINE LIVE 19400 UNT/0.65ML ~~LOC~~ SOLR: 0.6500 mL | Freq: Once | SUBCUTANEOUS | Status: DC

## 2011-04-29 ENCOUNTER — Other Ambulatory Visit: Payer: Self-pay | Admitting: Family Medicine

## 2011-04-29 ENCOUNTER — Ambulatory Visit (INDEPENDENT_AMBULATORY_CARE_PROVIDER_SITE_OTHER): Payer: Medicare Other | Admitting: Family Medicine

## 2011-04-29 ENCOUNTER — Ambulatory Visit (INDEPENDENT_AMBULATORY_CARE_PROVIDER_SITE_OTHER)
Admission: RE | Admit: 2011-04-29 | Discharge: 2011-04-29 | Disposition: A | Discharge status: Home / Self Care | Payer: Medicare Other | Source: Ambulatory Visit | Attending: Family Medicine | Admitting: Family Medicine

## 2011-04-29 ENCOUNTER — Encounter: Payer: Self-pay | Admitting: Family Medicine

## 2011-04-29 VITALS — BP 118/74 | HR 57 | Temp 98.0°F | Wt 219.0 lb

## 2011-04-29 DIAGNOSIS — R0982 Postnasal drip: Secondary | ICD-10-CM

## 2011-04-29 DIAGNOSIS — R05 Cough: Secondary | ICD-10-CM

## 2011-04-29 DIAGNOSIS — H6120 Impacted cerumen, unspecified ear: Secondary | ICD-10-CM | POA: Insufficient documentation

## 2011-04-29 MED ORDER — CEFDINIR 300 MG PO CAPS: 300.0000 mg | ORAL_CAPSULE | Freq: Two times a day (BID) | ORAL | Status: AC

## 2011-04-29 MED ORDER — FEXOFENADINE HCL 180 MG PO TABS: 180.0000 mg | ORAL_TABLET | Freq: Every day | ORAL | Status: DC

## 2011-04-29 NOTE — Assessment & Plan Note (Signed)
Reassured pt. No further intervention recommended at this time for his left ear cerumen. He'll notify us if this causes any problems in the near future and I told him we could have an ENT doctor look at him and likely be able to extract his cerumen better.

## 2011-04-29 NOTE — Progress Notes (Signed)
Quick Note:  Notified pt's wife of result today.  Erx'd generic omnicef 300mg  bid x 10d. F/u in office in 2 wks and will arrange f/u CXR at that time.--PM ______

## 2011-04-29 NOTE — Assessment & Plan Note (Signed)
Decided on trial of allegra 180mg  qd. If this is not helpful, we discussed trial of nasal steroid vs astepro. Reassured patient that I did not think this symptom (chronic throat clearing) was a sign of anything dangerous at this time.

## 2011-04-29 NOTE — Assessment & Plan Note (Signed)
Likely residual from his acute bronchitis recently.  He describes feeling improved. Given his slightly abnormal lung exam (I think his soft inspiratory crackles are likely atelectatic sounds, though) I'll check CXR today. Mucinex DM otc recommended prn.

## 2011-04-29 NOTE — Progress Notes (Signed)
OFFICE VISIT  04/29/2011   CC:  Chief Complaint  Patient presents with  . Cough    x 3 weeks, productive     HPI:    Patient is a 77 y.o. Caucasian male who presents for cough. Describes significant URI with cough and mild malaise, onset about 3 wks ago. The malaise is gone, URI sx's gone, just still having some coughing and says even this part is improved.  No fevers.  Energy level is good. Cough is minimally productive of some mildly yellowish sputum.  Question of mild intermittent SOB.  No wheezing.  No chest pain.    Describes long hx of chronic throat clearing, some feeling of phlegm being in throat.  Has seen ENT in distant past and was told exam of vocal cords was good and he had significant postnasal drip.  He denies nasal congestion/sneezing/runny nose.  Has some typical GER sx's but only about once every 2 wks or so when he eats particular culprit foods.  Past Medical History  Diagnosis Date  . DVT (deep venous thrombosis)     following inguinal hernia repair  . CAD (coronary artery disease)   . Nephrolithiasis   . Hyperlipidemia   . Hypertension   . Prostate nodule     multiple benign biopsies (Dr Earlene Plater)    Past Surgical History  Procedure Date  . Hernia repair     open, X 3  . Hip replacement bilateral     right was 2004  . Rotator cuff repair   . Status post coronary bypass graft 10/14/06     (LIMA to the LAD, saphenous vein graft to the diagonal, saphenous vein graft to the mid obtuse marginal, saphenous vein graft placed in a sequential fashion ot the RV branch of the right coronary artery and distal right coronary artery  . Carpal tunnel repair b   . Trigger finger x 3   . Status post recent left hip replacement     Outpatient Prescriptions Prior to Visit  Medication Sig Dispense Refill  . aspirin 325 MG tablet Take 325 mg by mouth daily.        Marland Kitchen docusate sodium (STOOL SOFTENER) 100 MG capsule Take 100 mg by mouth 2 (two) times daily.        .  furosemide (LASIX) 20 MG tablet Take 20 mg by mouth as needed.        Marland Kitchen ibuprofen (ADVIL,MOTRIN) 800 MG tablet TAKE ONE TABLET BY MOUTH UP TO THREE TIMES DAILY WITH FOOD  90 tablet  0  . metoprolol (LOPRESSOR) 50 MG tablet Take 1 tablet (50 mg total) by mouth 2 (two) times daily.  180 tablet  4  . rosuvastatin (CRESTOR) 5 MG tablet Take 5 mg by mouth daily. Take 1/2 tab daily       . multivitamin (THERAGRAN) per tablet Take 1 tablet by mouth daily.       Marland Kitchen zoster vaccine live, PF, (ZOSTAVAX) 13086 UNT/0.65ML injection Inject 19,400 Units into the skin once.  1 vial  0    Allergies  Allergen Reactions  . Penicillins   . Sulfonamide Derivatives     ROS As per HPI  PE: Blood pressure 118/74, pulse 57, temperature 98 F (36.7 C), temperature source Oral, weight 219 lb (99.338 kg), SpO2 92.00%. Gen: Alert, well appearing.  Patient is oriented to person, place, time, and situation. HEENT:   Ears: EACs with 90% blockage with brownish cerumen bilaterally.  I cleared the right EAC out  with my curette, but the left EAC cerumen was too deep for curette extraction, and irrigation caused excessive ear pain before it could wash things out.  Right TM with good light reflex and landmarks.  Left TM could not be viewed secondary to cerumen.  Eyes: no injection, icteris, swelling, or exudate.  EOMI, PERRLA. Nose: no drainage or turbinate edema/swelling.  No injection or focal lesion.  Mouth: lips without lesion/swelling.  Oral mucosa pink and moist.  Dentition intact and without obvious caries or gingival swelling.  Oropharynx without erythema, exudate, or swelling.  Neck: supple, ROM full.  Carotids 2+ bilat, without bruit.  No lymphadenopathy, thyromegaly, or mass. Chest: symmetric expansion, nonlabored respirations.  Clear and equal breath sounds in all lung fields.   CV: RRR, no m/r/g.  Peripheral pulses 2+ and symmetric. EXT: no clubbing, cyanosis, or edema.    LABS:  none  IMPRESSION AND  PLAN:  Coughing Likely residual from his acute bronchitis recently.  He describes feeling improved. Given his slightly abnormal lung exam (I think his soft inspiratory crackles are likely atelectatic sounds, though) I'll check CXR today. Mucinex DM otc recommended prn.  Postnasal drip Decided on trial of allegra 180mg  qd. If this is not helpful, we discussed trial of nasal steroid vs astepro. Reassured patient that I did not think this symptom (chronic throat clearing) was a sign of anything dangerous at this time.  Cerumen impaction Reassured pt. No further intervention recommended at this time for his left ear cerumen. He'll notify us if this causes any problems in the near future and I told him we could have an ENT doctor look at him and likely be able to extract his cerumen better.     FOLLOW UP: Return if symptoms worsen or fail to improve.

## 2011-04-29 NOTE — Patient Instructions (Signed)
Get OTC med called mucinex DM and take as directed for your cough.

## 2011-04-30 ENCOUNTER — Telehealth: Payer: Self-pay | Admitting: *Deleted

## 2011-04-30 NOTE — Telephone Encounter (Signed)
Agree.-PM 

## 2011-04-30 NOTE — Telephone Encounter (Signed)
VM from pt's wife stating they were told at pharmacy that Mr. Aird has a 5 % chance of having a reaction to [facility] as he has a PCN allergy.  Verbal from Dr. Milinda Cave that he has taken this into consideration and feels OK with pt taking Omnicef.  I spoke with Kathie Rhodes and relayed this message.  They will pick up RX and Mr. Murin will begin taking.

## 2011-05-16 ENCOUNTER — Ambulatory Visit (INDEPENDENT_AMBULATORY_CARE_PROVIDER_SITE_OTHER)
Admission: RE | Admit: 2011-05-16 | Discharge: 2011-05-16 | Disposition: A | Discharge status: Home / Self Care | Payer: Medicare Other | Source: Ambulatory Visit | Attending: Family Medicine | Admitting: Family Medicine

## 2011-05-16 ENCOUNTER — Encounter: Payer: Self-pay | Admitting: Family Medicine

## 2011-05-16 ENCOUNTER — Ambulatory Visit (INDEPENDENT_AMBULATORY_CARE_PROVIDER_SITE_OTHER): Payer: Medicare Other | Admitting: Family Medicine

## 2011-05-16 VITALS — BP 126/77 | HR 58 | Temp 97.6°F | Ht 69.0 in | Wt 219.0 lb

## 2011-05-16 DIAGNOSIS — J189 Pneumonia, unspecified organism: Secondary | ICD-10-CM | POA: Insufficient documentation

## 2011-05-16 DIAGNOSIS — H6120 Impacted cerumen, unspecified ear: Secondary | ICD-10-CM

## 2011-05-16 DIAGNOSIS — Z23 Encounter for immunization: Secondary | ICD-10-CM

## 2011-05-16 NOTE — Assessment & Plan Note (Signed)
Clinically resolved.   As per radiologist's recommendation, will check repeat CXR for radiographic resolution.

## 2011-05-16 NOTE — Progress Notes (Signed)
OFFICE NOTE  05/16/2011  CC:  Chief Complaint  Patient presents with  . Follow-up    on pneumonia- pt states he feels "great"     HPI:   Patient is a 77 y.o. Caucasian male who is here for follow up for pneumonia. Says he feels great.  Cough is completely resolved.  Energy level and appetite are good.  No SOB or CP. Asks me to look in left ear again, hx of wax impaction, has been putting some OTC drops in.  No hearing complaints.  No pain. Pertinent PMH:  CAD, s/p CABG HTN Hx of DVT Osteoarthritis, hx of hip surgery Hx of CVA Chronic PND  MEDS;   Outpatient Prescriptions Prior to Visit  Medication Sig Dispense Refill  . aspirin 325 MG tablet Take 325 mg by mouth daily.        Marland Kitchen docusate sodium (STOOL SOFTENER) 100 MG capsule Take 100 mg by mouth 2 (two) times daily.        . fexofenadine (ALLEGRA) 180 MG tablet Take 1 tablet (180 mg total) by mouth daily.  30 tablet  11  . furosemide (LASIX) 20 MG tablet Take 20 mg by mouth as needed.        Marland Kitchen ibuprofen (ADVIL,MOTRIN) 800 MG tablet TAKE ONE TABLET BY MOUTH UP TO THREE TIMES DAILY WITH FOOD  90 tablet  0  . metoprolol (LOPRESSOR) 50 MG tablet Take 1 tablet (50 mg total) by mouth 2 (two) times daily.  180 tablet  4  . Multiple Vitamin (MULTIVITAMIN) tablet Take 1 tablet by mouth daily.        . rosuvastatin (CRESTOR) 5 MG tablet Take 5 mg by mouth daily. Take 1/2 tab daily         PE: Blood pressure 126/77, pulse 58, temperature 97.6 F (36.4 C), temperature source Oral, height 5\' 9"  (1.753 m), weight 219 lb (99.338 kg), SpO2 94.00%. Gen: Alert, well appearing.  Patient is oriented to person, place, time, and situation. Ears: left EAC with 50% cerumen impaction, removed most of this with curette without problem today.  TM normal. Chest: symmetric expansion, nonlabored respirations.  Clear and equal breath sounds in all lung fields.   CV: RRR, no m/r/g.  Peripheral pulses 2+ and symmetric.   IMPRESSION AND  PLAN:  Pneumonia Clinically resolved.   As per radiologist's recommendation, will check repeat CXR for radiographic resolution.   Cerumen impaction; removed today.  Continue OTC drops to keep this cerumen moist and easy to remove when he is in the office.  Flu vaccine IM today.  FOLLOW UP:  Return if symptoms worsen or fail to improve.

## 2011-05-16 NOTE — Patient Instructions (Signed)
Contact [facility] for appt with Dr. Lazarus Salines if you want to.  If any help with referral is needed then call our office and let us know.

## 2011-05-17 ENCOUNTER — Ambulatory Visit (INDEPENDENT_AMBULATORY_CARE_PROVIDER_SITE_OTHER): Payer: Medicare Other | Admitting: General Surgery

## 2011-05-17 ENCOUNTER — Encounter (INDEPENDENT_AMBULATORY_CARE_PROVIDER_SITE_OTHER): Payer: Self-pay | Admitting: General Surgery

## 2011-05-17 ENCOUNTER — Ambulatory Visit
Admission: RE | Admit: 2011-05-17 | Discharge: 2011-05-17 | Disposition: A | Discharge status: Home / Self Care | Payer: Medicare Other | Source: Ambulatory Visit | Attending: General Surgery | Admitting: General Surgery

## 2011-05-17 VITALS — BP 136/84 | HR 66 | Temp 97.2°F | Resp 14 | Ht 72.0 in | Wt 220.0 lb

## 2011-05-17 DIAGNOSIS — R1032 Left lower quadrant pain: Secondary | ICD-10-CM

## 2011-05-17 MED ORDER — IOHEXOL 300 MG/ML  SOLN: 100.0000 mL | Freq: Once | INTRAMUSCULAR | Status: AC | PRN

## 2011-05-17 NOTE — Progress Notes (Signed)
Chief complaint: Left groin pain  History: Patient returns to the office. He has a history of laparoscopic repair of a double ureter at left inguinal hernia. I did this in 2009. His postoperative course was complicated by DVT and then bleeding on Lovenox with a retroperitoneal hematoma and he had placement of a vena cava filter. He had 2 previous anterior repairs. The patient has had intermittent problems with groin pain essentially since surgery. A solid 2 years ago when he was getting better. Now appears to be worse. The pain occurs only when he flexes his thigh beyond about 90 such as getting into a car. He then has 2 help lift his leg when his hand to prevent pain. He also gets pain with coughing or sneezing. It is a sharp pain located in the groin crease without radiation to entire scrotum. He can walk without pain and again only occurs when flexing. Since I saw him in 2010 he has had a left hip replacement as well. He has seen his orthopedic surgeon who does not feel that this is related to the hip repair.  Physical exam: In general he appears well. Abdomen is soft and nontender. With the patient standing coughing and doing Valsalva maneuver I cannot feel any abnormalities in the left groin and specifically no recurrent hernia. No mass. No particular tenderness.  Assessment plan: Chronic left groin pain following laparoscopic repair with history of 2 previous anterior repairs and also history of left hip replacement. I suspect this is due to scarring around his mesh. I don't see any signs of specific nerve entrapment. We will go ahead and get a CT scan to make sure there is no small recurrence not able to feel or some other cause for his pain. I would not recommend mesh removal or repeat surgery due to his history. We discussed medications such as Elavil or Neurontin and is not interested in these at present. I will call him with the CT result. Followup pending this.

## 2011-05-20 LAB — CBC
HCT: 45.4
Platelets: 161
RDW: 13.4

## 2011-05-20 LAB — DIFFERENTIAL
Basophils Relative: 1
Lymphs Abs: 1.8
Monocytes Absolute: 0.7
Monocytes Relative: 11
Neutro Abs: 3.8

## 2011-05-20 LAB — URINALYSIS, ROUTINE W REFLEX MICROSCOPIC
Ketones, ur: NEGATIVE
Nitrite: NEGATIVE
Protein, ur: NEGATIVE

## 2011-05-20 LAB — COMPREHENSIVE METABOLIC PANEL
Albumin: 4.1
Alkaline Phosphatase: 60
BUN: 14
Calcium: 9.6
Potassium: 4
Sodium: 143
Total Protein: 6.7

## 2011-05-21 ENCOUNTER — Encounter: Payer: Self-pay | Admitting: Family Medicine

## 2011-05-21 LAB — DIFFERENTIAL
Basophils Absolute: 0
Eosinophils Absolute: 0
Lymphocytes Relative: 8 — ABNORMAL LOW
Lymphs Abs: 0.8
Neutrophils Relative %: 86 — ABNORMAL HIGH

## 2011-05-21 LAB — ABO/RH: ABO/RH(D): O POS

## 2011-05-21 LAB — CBC
HCT: 28.1 — ABNORMAL LOW
HCT: 37 — ABNORMAL LOW
Hemoglobin: 12.6 — ABNORMAL LOW
Hemoglobin: 9.1 — ABNORMAL LOW
Hemoglobin: 9.6 — ABNORMAL LOW
Hemoglobin: 9.8 — ABNORMAL LOW
MCHC: 34.1
MCHC: 34.2
MCHC: 34.6
MCV: 101.7 — ABNORMAL HIGH
MCV: 102.7 — ABNORMAL HIGH
Platelets: 155
RBC: 2.6 — ABNORMAL LOW
RBC: 2.73 — ABNORMAL LOW
RBC: 3.64 — ABNORMAL LOW
RDW: 13.8
WBC: 7
WBC: 7.2

## 2011-05-21 LAB — URINALYSIS, ROUTINE W REFLEX MICROSCOPIC
Bilirubin Urine: NEGATIVE
Glucose, UA: NEGATIVE
Hgb urine dipstick: NEGATIVE
Specific Gravity, Urine: 1.024
Urobilinogen, UA: 0.2
pH: 8.5 — ABNORMAL HIGH

## 2011-05-21 LAB — COMPREHENSIVE METABOLIC PANEL
BUN: 12
CO2: 26
Calcium: 9.3
Chloride: 102
Creatinine, Ser: 0.92
GFR calc non Af Amer: >60
Glucose, Bld: 135 — ABNORMAL HIGH
Total Bilirubin: 1.2

## 2011-05-21 LAB — CROSSMATCH
ABO/RH(D): O POS
Antibody Screen: NEGATIVE

## 2011-05-21 LAB — HEMOGLOBIN AND HEMATOCRIT, BLOOD: Hemoglobin: 11.3 — ABNORMAL LOW

## 2011-05-21 LAB — BASIC METABOLIC PANEL
CO2: 27
Calcium: 8.6
Creatinine, Ser: 0.82
GFR calc non Af Amer: >60
Glucose, Bld: 106 — ABNORMAL HIGH
Sodium: 137

## 2011-05-21 LAB — PREPARE FRESH FROZEN PLASMA

## 2011-06-25 ENCOUNTER — Other Ambulatory Visit: Payer: Self-pay | Admitting: Family Medicine

## 2011-06-25 NOTE — Telephone Encounter (Signed)
Pt seen in January and should have had appt in 6 months.  No follow up at that time or on schedule now.  Is this OK?

## 2011-07-30 ENCOUNTER — Telehealth: Payer: Self-pay | Admitting: Cardiology

## 2011-07-30 MED ORDER — ROSUVASTATIN CALCIUM 5 MG PO TABS: ORAL_TABLET | ORAL | Status: DC

## 2011-07-30 NOTE — Telephone Encounter (Signed)
Pt needs refill of crestor, uses [address] [place] [phone]

## 2011-09-02 ENCOUNTER — Encounter: Payer: Self-pay | Admitting: Family Medicine

## 2011-09-03 ENCOUNTER — Encounter: Payer: Medicare Other | Admitting: Family Medicine

## 2011-09-13 ENCOUNTER — Encounter: Payer: Self-pay | Admitting: Family Medicine

## 2011-09-13 ENCOUNTER — Ambulatory Visit (INDEPENDENT_AMBULATORY_CARE_PROVIDER_SITE_OTHER): Payer: Medicare Other | Admitting: Family Medicine

## 2011-09-13 VITALS — BP 153/92 | HR 56 | Temp 98.7°F | Ht 69.0 in | Wt 217.0 lb

## 2011-09-13 DIAGNOSIS — I10 Essential (primary) hypertension: Secondary | ICD-10-CM

## 2011-09-13 DIAGNOSIS — Z23 Encounter for immunization: Secondary | ICD-10-CM | POA: Diagnosis not present

## 2011-09-13 DIAGNOSIS — E785 Hyperlipidemia, unspecified: Secondary | ICD-10-CM

## 2011-09-13 DIAGNOSIS — R0989 Other specified symptoms and signs involving the circulatory and respiratory systems: Secondary | ICD-10-CM

## 2011-09-13 DIAGNOSIS — I2510 Atherosclerotic heart disease of native coronary artery without angina pectoris: Secondary | ICD-10-CM

## 2011-09-13 DIAGNOSIS — Z0000 Encounter for general adult medical examination without abnormal findings: Secondary | ICD-10-CM | POA: Diagnosis not present

## 2011-09-13 LAB — COMPREHENSIVE METABOLIC PANEL
Albumin: 4.4 g/dL (ref 3.5–5.2)
BUN: 15 mg/dL (ref 6–23)
Calcium: 9.2 mg/dL (ref 8.4–10.5)
Chloride: 106 mEq/L (ref 96–112)
Creatinine, Ser: 0.8 mg/dL (ref 0.4–1.5)
Glucose, Bld: 95 mg/dL (ref 70–99)
Potassium: 4.1 mEq/L (ref 3.5–5.1)

## 2011-09-13 LAB — LIPID PANEL
Cholesterol: 147 mg/dL (ref 0–200)
HDL: 43.6 mg/dL (ref >39.00)
Triglycerides: 130 mg/dL (ref 0.0–149.0)

## 2011-09-13 LAB — CBC WITH DIFFERENTIAL/PLATELET
Eosinophils Absolute: 0.1 10*3/uL (ref 0.0–0.7)
Eosinophils Relative: 1.7 % (ref 0.0–5.0)
HCT: 46.5 % (ref 39.0–52.0)
Lymphs Abs: 2.1 10*3/uL (ref 0.7–4.0)
MCHC: 34.4 g/dL (ref 30.0–36.0)
MCV: 102.1 fl — ABNORMAL HIGH (ref 78.0–100.0)
Monocytes Absolute: 0.7 10*3/uL (ref 0.1–1.0)
Neutrophils Relative %: 56.7 % (ref 43.0–77.0)
Platelets: 162 10*3/uL (ref 150.0–400.0)
RDW: 14.1 % (ref 11.5–14.6)
WBC: 6.8 10*3/uL (ref 4.5–10.5)

## 2011-09-13 LAB — TSH: TSH: 1.38 u[IU]/mL (ref 0.35–5.50)

## 2011-09-13 NOTE — Progress Notes (Signed)
Office Note 09/14/2011  CC:  Chief Complaint  Patient presents with  . Annual Exam    no problems    HPI:  Bernard Byrd is a 78 y.o. White male who is here for routine health maintenance exam. Says he has no acute complaints. Compliant with meds, no side effects.  Home bp monitoring reveals normal bps.   Past Medical History  Diagnosis Date  . DVT (deep venous thrombosis) 2009    Post-op from laparoscopic inguinal hernia repair  . CAD (coronary artery disease)     CABG 2008:  myoview  2010 showed no ischemia and a normal EF  . Nephrolithiasis   . Hyperlipidemia   . Hypertension   . Prostate nodule     multiple benign biopsies (Dr Earlene Plater)  . MI (myocardial infarction) 10/14/2006    Inferior.    . Osteoarthritis     Primarily hips  . Melanoma     skin graft surgery required  . Groin pain, left lower quadrant     Chronic, intermittent: believed to be due to scarring around MESH placed at inguinal hernia repair  . CVA (cerebral vascular accident)     possible/no residual deficit--following CABG   . Retroperitoneal hematoma 2009    on lovenox; greenfield filter placed after this  . Diverticulosis of rectosigmoid     Noted on CT 04/2011 done to eval chronic groin pain  . MELANOMA, HX OF 12/02/2008    Past Surgical History  Procedure Date  . Hip replacement bilateral     right was 2004, left 2011  . Rotator cuff repair   . Status post coronary bypass graft 10/14/06     (LIMA to the LAD, saphenous vein graft to the diagonal, saphenous vein graft to the mid obtuse marginal, saphenous vein graft placed in a sequential fashion ot the RV branch of the right coronary artery and distal right coronary artery  . Carpal tunnel repair b   . Trigger finger x 3   . Kidney stone removal 01/18/88  . Melanoma excision 08/1990    left shoulder - skin graft from leg  . Hernia repair 05/20/2008    open, X 3    Family History  Problem Relation Age of Onset  . Cancer Mother 41   "male"  . Heart attack Father 68  . Heart disease Father   . Heart disease Brother     History   Social History  . Marital Status: Married    Spouse Name: N/A    Number of Children: N/A  . Years of Education: N/A   Occupational History  . Not on file.   Social History Main Topics  . Smoking status: Never Smoker   . Smokeless tobacco: Never Used  . Alcohol Use: No  . Drug Use: No  . Sexually Active: Not on file   Other Topics Concern  . 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    Outpatient Prescriptions Prior to Visit  Medication Sig Dispense Refill  . aspirin 325 MG tablet Take 325 mg by mouth daily.        Marland Kitchen docusate sodium (STOOL SOFTENER) 100 MG capsule Take 100 mg by mouth 2 (two) times daily.        Marland Kitchen ibuprofen (ADVIL,MOTRIN) 800 MG tablet TAKE ONE TABLET BY MOUTH UP TO THREE TIMES DAILY WITH FOOD  90 tablet  1  .  metoprolol (LOPRESSOR) 50 MG tablet Take 1 tablet (50 mg total) by mouth 2 (two) times daily.  180 tablet  4  . Multiple Vitamin (MULTIVITAMIN) tablet Take 1 tablet by mouth daily.        . rosuvastatin (CRESTOR) 5 MG tablet Take 1/2 tab daily  30 tablet  12  . fexofenadine (ALLEGRA) 180 MG tablet Take 1 tablet (180 mg total) by mouth daily.  30 tablet  11  . furosemide (LASIX) 20 MG tablet Take 20 mg by mouth as needed.          Allergies  Allergen Reactions  . Penicillins Hives and Rash  . Sulfonamide Derivatives Hives and Rash    ROS Review of Systems  Constitutional: Negative for fever, chills, appetite change and fatigue.  HENT: Negative for ear pain, congestion, sore throat, neck stiffness and dental problem.   Eyes: Negative for discharge, redness and visual disturbance.  Respiratory: Negative for cough, chest tightness, shortness of breath and wheezing.   Cardiovascular: Negative for chest pain, palpitations and leg swelling.    Gastrointestinal: Negative for nausea, vomiting, abdominal pain, diarrhea and blood in stool.  Genitourinary: Negative for dysuria, urgency, frequency, hematuria, flank pain and difficulty urinating.       Chronic, intermittent left groin pain  Musculoskeletal: Positive for arthralgias (chronic right hip pain (he is s/p total hip and may require redo). Negative for myalgias, back pain and joint swelling.  Skin: Negative for pallor and rash.  Neurological: Negative for dizziness, speech difficulty, weakness and headaches.  Hematological: Negative for adenopathy. Does not bruise/bleed easily.  Psychiatric/Behavioral: Negative for confusion and sleep disturbance. The patient is not nervous/anxious.     PE; Blood pressure 153/92, pulse 56, temperature 98.7 F (37.1 C), temperature source Temporal, height 5\' 9"  (1.753 m), weight 217 lb (98.431 kg), SpO2 94.00%. Gen: Alert, well appearing.  Patient is oriented to person, place, time, and situation. ENT: Ears: EACs clear, normal epithelium.  TMs with good light reflex and landmarks bilaterally.  Eyes: no injection, icteris, swelling, or exudate.  EOMI, PERRLA. Nose: no drainage or turbinate edema/swelling.  No injection or focal lesion.  Mouth: lips without lesion/swelling.  Oral mucosa pink and moist.  Dentition intact and without obvious caries or gingival swelling.  Oropharynx without erythema, exudate, or swelling.  Neck - No masses or thyromegaly or limitation in range of motion CV: RRR, no m/r/g.   LUNGS: CTA bilat except some soft, inspiratory crackles in left base and axilla, nonlabored resps, good aeration in all lung fields. ABD: soft, mild left lower quadrant TTP without rebound or guarding, ND, BS normal.  No hepatospenomegaly or mass.  No bruits. EXT: no clubbing, cyanosis, or edema.   Pertinent labs:  None today  ASSESSMENT AND PLAN:   Health maintenance examination Reviewed age and gender appropriate health maintenance issues  (prudent diet, regular exercise, health risks of tobacco and excessive alcohol, use of seatbelts, fire alarms in home, use of sunscreen).  Also reviewed age and gender appropriate health screening as well as vaccine recommendations. Tdap given IM today.  Hyperlipidemia Due for FLP. Continue statin and low chol diet, + exercise.  HTN (hypertension), benign Problem stable.  Continue current medications and diet appropriate for this condition.  We have reviewed our general long term plan for this problem and also reviewed symptoms and signs that should prompt the patient to call or return to the office. Check electrolytes and cr today.  CAD Stable. Keep routine cardiology f/u with Dr.  Crenshaw in 10/2011 (?plan for routine myoview at that time).   Abnormal lung sounds Adventitious lung sounds on the left where he had pneumonia a few months ago: ?scarring, ?atelectasis, ?early infiltrate again. Will check CXR and make sure there's no sign of post-obstructive process that would need further imaging with CT to eval for mass.     FOLLOW UP:  Return in about 6 months (around 03/12/2012) for f/u htn, cad, hyperlipid.

## 2011-09-14 ENCOUNTER — Encounter: Payer: Self-pay | Admitting: Family Medicine

## 2011-09-14 DIAGNOSIS — I10 Essential (primary) hypertension: Secondary | ICD-10-CM | POA: Insufficient documentation

## 2011-09-14 DIAGNOSIS — D7589 Other specified diseases of blood and blood-forming organs: Secondary | ICD-10-CM | POA: Insufficient documentation

## 2011-09-14 DIAGNOSIS — E785 Hyperlipidemia, unspecified: Secondary | ICD-10-CM | POA: Insufficient documentation

## 2011-09-14 DIAGNOSIS — R0989 Other specified symptoms and signs involving the circulatory and respiratory systems: Secondary | ICD-10-CM | POA: Insufficient documentation

## 2011-09-14 DIAGNOSIS — Z0000 Encounter for general adult medical examination without abnormal findings: Secondary | ICD-10-CM | POA: Insufficient documentation

## 2011-09-14 NOTE — Assessment & Plan Note (Signed)
Stable. Keep routine cardiology f/u with Dr. Jens Som in 10/2011 (?plan for routine myoview at that time).

## 2011-09-14 NOTE — Assessment & Plan Note (Signed)
Adventitious lung sounds on the left where he had pneumonia a few months ago: ?scarring, ?atelectasis, ?early infiltrate again. Will check CXR and make sure there's no sign of post-obstructive process that would need further imaging with CT to eval for mass.

## 2011-09-14 NOTE — Assessment & Plan Note (Signed)
Reviewed age and gender appropriate health maintenance issues (prudent diet, regular exercise, health risks of tobacco and excessive alcohol, use of seatbelts, fire alarms in home, use of sunscreen).  Also reviewed age and gender appropriate health screening as well as vaccine recommendations. Tdap given IM today.

## 2011-09-14 NOTE — Assessment & Plan Note (Signed)
Due for FLP. Continue statin and low chol diet, + exercise.

## 2011-09-14 NOTE — Assessment & Plan Note (Signed)
Problem stable.  Continue current medications and diet appropriate for this condition.  We have reviewed our general long term plan for this problem and also reviewed symptoms and signs that should prompt the patient to call or return to the office. Check electrolytes and cr today.

## 2011-09-16 ENCOUNTER — Ambulatory Visit (INDEPENDENT_AMBULATORY_CARE_PROVIDER_SITE_OTHER)
Admission: RE | Admit: 2011-09-16 | Discharge: 2011-09-16 | Disposition: A | Discharge status: Home / Self Care | Payer: Medicare Other | Source: Ambulatory Visit | Attending: Family Medicine | Admitting: Family Medicine

## 2011-09-16 DIAGNOSIS — R0989 Other specified symptoms and signs involving the circulatory and respiratory systems: Secondary | ICD-10-CM | POA: Diagnosis not present

## 2011-09-16 DIAGNOSIS — J984 Other disorders of lung: Secondary | ICD-10-CM | POA: Diagnosis not present

## 2011-09-16 DIAGNOSIS — Z8701 Personal history of pneumonia (recurrent): Secondary | ICD-10-CM | POA: Diagnosis not present

## 2011-10-17 DIAGNOSIS — D485 Neoplasm of uncertain behavior of skin: Secondary | ICD-10-CM | POA: Diagnosis not present

## 2011-10-17 DIAGNOSIS — C44621 Squamous cell carcinoma of skin of unspecified upper limb, including shoulder: Secondary | ICD-10-CM | POA: Diagnosis not present

## 2011-10-18 ENCOUNTER — Telehealth: Payer: Self-pay | Admitting: *Deleted

## 2011-10-18 MED ORDER — AZITHROMYCIN 250 MG PO TABS: ORAL_TABLET | ORAL | Status: DC

## 2011-10-18 NOTE — Telephone Encounter (Signed)
Wife notified and she is thankful for our help.

## 2011-10-18 NOTE — Telephone Encounter (Signed)
Pt was given clindamycin to take prior to any procedures.  Pt took 2 yesterday prior to a dermatology appt and began having itching and difficulty breathing last night.  Pt's wife states this same thing happened 2 weeks ago prior to a dental procedure.  Pt unable to reach surgeon who gave RX or dentist.

## 2011-10-18 NOTE — Telephone Encounter (Signed)
OK, basically he has allergy/intolerance to penicillins, sulfa drugs, and now clindamycin. His next best alternative is azithromycin.  I have called in this med to his pharmacy.  Please have him notify his dentist when he goes for his procedure that he is allergic to clindamycin and I gave him azithromycin to take prior to procedure.  I put a couple of RFs on it for future dental procedures should he need it-thx

## 2011-11-08 DIAGNOSIS — D485 Neoplasm of uncertain behavior of skin: Secondary | ICD-10-CM | POA: Diagnosis not present

## 2011-11-08 DIAGNOSIS — L821 Other seborrheic keratosis: Secondary | ICD-10-CM | POA: Diagnosis not present

## 2011-11-08 DIAGNOSIS — C44621 Squamous cell carcinoma of skin of unspecified upper limb, including shoulder: Secondary | ICD-10-CM | POA: Diagnosis not present

## 2011-11-13 ENCOUNTER — Encounter: Payer: Self-pay | Admitting: Cardiology

## 2011-11-13 ENCOUNTER — Ambulatory Visit (INDEPENDENT_AMBULATORY_CARE_PROVIDER_SITE_OTHER): Payer: Medicare Other | Admitting: Cardiology

## 2011-11-13 VITALS — BP 110/78 | HR 58 | Ht 72.0 in | Wt 221.1 lb

## 2011-11-13 DIAGNOSIS — I2510 Atherosclerotic heart disease of native coronary artery without angina pectoris: Secondary | ICD-10-CM

## 2011-11-13 DIAGNOSIS — E7800 Pure hypercholesterolemia, unspecified: Secondary | ICD-10-CM

## 2011-11-13 DIAGNOSIS — I10 Essential (primary) hypertension: Secondary | ICD-10-CM

## 2011-11-13 NOTE — Assessment & Plan Note (Signed)
Continue aspirin and statin. Schedule Myoview for risk stratification. 

## 2011-11-13 NOTE — Patient Instructions (Signed)
Your physician wants you to follow-up in: ONE YEAR You will receive a reminder letter in the mail two months in advance. If you don't receive a letter, please call our office to schedule the follow-up appointment.   Your physician has requested that you have a lexiscan myoview. For further information please visit [email or web address]. Please follow instruction sheet, as given.   

## 2011-11-13 NOTE — Assessment & Plan Note (Signed)
Blood pressure controlled. Continue present medications. Potassium and renal function monitored by primary care. 

## 2011-11-13 NOTE — Progress Notes (Signed)
HPI: Pleasant male with H/O CAD for followup. H/o inferior MI s/p CABG x 5 in 2/08 w/ ? CVA post CABG. Prevoious to this, no major health problems. Previously cared for at [facility]. In April of 2010 A CT angiogram showed no pulmonary embolus. A myoview showed a prior small inferobasal infarct but no ischemia. His ejection fraction was 57%. ABIs in August of 2010 were normal. I last saw him in March 2012. Since then the patient denies any dyspnea on exertion, orthopnea, PND, pedal edema, palpitations, syncope or chest pain.   Current Outpatient Prescriptions  Medication Sig Dispense Refill  . aspirin 325 MG tablet Take 325 mg by mouth daily.        Marland Kitchen azithromycin (ZITHROMAX) 250 MG tablet 2 tabs po 1 hour prior to dental procedure  2 each  2  . docusate sodium (STOOL SOFTENER) 100 MG capsule Take 100 mg by mouth 2 (two) times daily.        Marland Kitchen ibuprofen (ADVIL,MOTRIN) 800 MG tablet TAKE ONE TABLET BY MOUTH UP TO THREE TIMES DAILY WITH FOOD  90 tablet  1  . metoprolol (LOPRESSOR) 50 MG tablet Take 1 tablet (50 mg total) by mouth 2 (two) times daily.  180 tablet  4  . Multiple Vitamin (MULTIVITAMIN) tablet Take 1 tablet by mouth daily.        Marland Kitchen omeprazole (PRILOSEC OTC) 20 MG tablet Take 20 mg by mouth as needed.       . rosuvastatin (CRESTOR) 5 MG tablet Take 1/2 tab daily  30 tablet  12     Past Medical History  Diagnosis Date  . DVT (deep venous thrombosis) 2009    Post-op from laparoscopic inguinal hernia repair  . CAD (coronary artery disease)     CABG 2008:  myoview  2010 showed no ischemia and a normal EF  . Nephrolithiasis   . Hyperlipidemia   . Hypertension   . Prostate nodule     multiple benign biopsies (Dr Earlene Plater)  . MI (myocardial infarction) 10/14/2006    Inferior.    . Osteoarthritis     Primarily hips  . Melanoma     skin graft surgery required  . Groin pain, left lower quadrant     Chronic, intermittent: believed to be due to scarring around MESH placed at inguinal  hernia repair  . CVA (cerebral vascular accident)     possible/no residual deficit--following CABG   . Retroperitoneal hematoma 2009    on lovenox; greenfield filter placed after this  . Diverticulosis of rectosigmoid     Noted on CT 04/2011 done to eval chronic groin pain  . MELANOMA, HX OF 12/02/2008    Past Surgical History  Procedure Date  . Hip replacement bilateral     right was 2004, left 2011  . Rotator cuff repair   . Status post coronary bypass graft 10/14/06     (LIMA to the LAD, saphenous vein graft to the diagonal, saphenous vein graft to the mid obtuse marginal, saphenous vein graft placed in a sequential fashion ot the RV branch of the right coronary artery and distal right coronary artery  . Carpal tunnel repair b   . Trigger finger x 3   . Kidney stone removal 01/18/88  . Melanoma excision 08/1990    left shoulder - skin graft from leg  . Hernia repair 05/20/2008    open, X 3    History   Social History  . Marital Status: Married  Spouse Name: N/A    Number of Children: N/A  . Years of Education: N/A   Occupational History  . Not on file.   Social History Main Topics  . Smoking status: Never Smoker   . Smokeless tobacco: Never Used  . Alcohol Use: No  . Drug Use: No  . Sexually Active: Not on file   Other Topics Concern  . 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    ROS: Hip pain but no fevers or chills, productive cough, hemoptysis, dysphasia, odynophagia, melena, hematochezia, dysuria, hematuria, rash, seizure activity, orthopnea, PND, pedal edema, claudication. Remaining systems are negative.  Physical Exam: Well-developed well-nourished in no acute distress.  Skin is warm and dry.  HEENT is normal.  Neck is supple. No thyromegaly.  Chest is clear to auscultation with normal expansion.  Cardiovascular exam is regular rate and rhythm.  Abdominal  exam nontender or distended. No masses palpated. Extremities show no edema. neuro grossly intact  ECG sinus rhythm at a rate of 58. Nonspecific inferior T-wave changes.

## 2011-11-13 NOTE — Assessment & Plan Note (Signed)
Continue statin. Lipids and liver monitored by primary care. 

## 2011-11-20 DIAGNOSIS — L905 Scar conditions and fibrosis of skin: Secondary | ICD-10-CM | POA: Diagnosis not present

## 2011-11-20 DIAGNOSIS — C44621 Squamous cell carcinoma of skin of unspecified upper limb, including shoulder: Secondary | ICD-10-CM | POA: Diagnosis not present

## 2011-11-21 ENCOUNTER — Ambulatory Visit (HOSPITAL_COMMUNITY): Payer: Medicare Other | Attending: Cardiovascular Disease | Admitting: Radiology

## 2011-11-21 VITALS — BP 138/73 | Ht 72.0 in | Wt 220.0 lb

## 2011-11-21 DIAGNOSIS — E785 Hyperlipidemia, unspecified: Secondary | ICD-10-CM | POA: Insufficient documentation

## 2011-11-21 DIAGNOSIS — Z951 Presence of aortocoronary bypass graft: Secondary | ICD-10-CM | POA: Insufficient documentation

## 2011-11-21 DIAGNOSIS — I2510 Atherosclerotic heart disease of native coronary artery without angina pectoris: Secondary | ICD-10-CM

## 2011-11-21 DIAGNOSIS — E7800 Pure hypercholesterolemia, unspecified: Secondary | ICD-10-CM

## 2011-11-21 DIAGNOSIS — I252 Old myocardial infarction: Secondary | ICD-10-CM | POA: Diagnosis not present

## 2011-11-21 DIAGNOSIS — Z8679 Personal history of other diseases of the circulatory system: Secondary | ICD-10-CM | POA: Insufficient documentation

## 2011-11-21 DIAGNOSIS — I10 Essential (primary) hypertension: Secondary | ICD-10-CM | POA: Insufficient documentation

## 2011-11-21 DIAGNOSIS — R0602 Shortness of breath: Secondary | ICD-10-CM

## 2011-11-21 MED ORDER — TECHNETIUM TC 99M TETROFOSMIN IV KIT
10.0000 | PACK | Freq: Once | INTRAVENOUS | Status: AC | PRN
  Administered 2011-11-21: 10 via INTRAVENOUS

## 2011-11-21 MED ORDER — TECHNETIUM TC 99M TETROFOSMIN IV KIT
30.0000 | PACK | Freq: Once | INTRAVENOUS | Status: AC | PRN
  Administered 2011-11-21: 30 via INTRAVENOUS

## 2011-11-21 MED ORDER — REGADENOSON 0.4 MG/5ML IV SOLN
0.4000 mg | Freq: Once | INTRAVENOUS | Status: AC
  Administered 2011-11-21: 0.4 mg via INTRAVENOUS

## 2011-11-21 NOTE — Progress Notes (Signed)
[facility] SITE 3 NUCLEAR MED [address] [place] [address] [phone]  Cardiology Nuclear Med Study  Bernard Byrd is a 78 y.o. male     MRN : [id number]     DOB: May 18, 1931  Procedure Date: 11/21/2011  Nuclear Med Background Indication for Stress Test:  Evaluation for Ischemia and Graft Patency History:  '08 MI: IWMI-CABG, '09 DVT, 4/10 MPS: small inferobasal infarct,(-) ischemia EF: 57% Cardiac Risk Factors: CVA, Hypertension and Lipids  Symptoms:  none   Nuclear Pre-Procedure Caffeine/Decaff Intake:  None NPO After: 7:00pm   Lungs:  clear O2 Sat: 96% on room air. IV 0.9% NS with Angio Cath:  22g  IV Site: R Antecubital  IV Started by:  Stanton Kidney, EMT-P  Chest Size (in):  46 Cup Size: n/a  Height: 6' (1.829 m)  Weight:  220 lb (99.791 kg)  BMI:  Body mass index is 29.84 kg/(m^2). Tech Comments:  Meds taken as directed    Nuclear Med Study 1 or 2 day study: 1 day  Stress Test Type:  Lexiscan  Reading MD: Kristeen Miss, MD  Order Authorizing Provider: Olga Millers MD  Resting Radionuclide: Technetium 59m Tetrofosmin  Resting Radionuclide Dose: 11.0 mCi   Stress Radionuclide:  Technetium 24m Tetrofosmin  Stress Radionuclide Dose: 31.5 mCi           Stress Protocol Rest HR: 52 Stress HR: 71  Rest BP: 138/73 Stress BP: 151/85  Exercise Time (min): n/a METS: n/a   Predicted Max HR: 142 bpm % Max HR: 50 bpm Rate Pressure Product: 64403   Dose of Adenosine (mg):  n/a Dose of Lexiscan: 0.4 mg  Dose of Atropine (mg): n/a Dose of Dobutamine: n/a mcg/kg/min (at max HR)  Stress Test Technologist: Milana Na, EMT-P  Nuclear Technologist:  Doyne Keel, CNMT     Rest Procedure:  Myocardial perfusion imaging was performed at rest 45 minutes following the intravenous administration of Technetium 77m Tetrofosmin. Rest ECG: Sinus Bradycardia with PVCS  Stress Procedure:  The patient received IV Lexiscan 0.4 mg over 15-seconds.  Technetium 22m  Tetrofosmin injected at 30-seconds.  There were no significant changes and flushed with Lexiscan.  Quantitative spect images were obtained after a 45 minute delay. Stress ECG: No significant change from baseline ECG  QPS Raw Data Images:  Normal; no motion artifact; normal heart/lung ratio. Stress Images:  There is a small severe defect in the inferior basal region with normal uptake in the other areas.  Rest Images:  There is a small severe defect in the inferior basal region with normal uptake in the other areas. Subtraction (SDS):  No evidence of ischemia.  There is a scar in the inferior basal region consistent with an inferior basal MI. Transient Ischemic Dilatation (Normal <1.22):  0.99 Lung/Heart Ratio (Normal <0.45):  0.33  Quantitative Gated Spect Images QGS EDV:  82 ml QGS ESV:  37 ml  Impression Exercise Capacity:  Lexiscan with no exercise. BP Response:  Normal blood pressure response. Clinical Symptoms:  No significant symptoms noted. ECG Impression:  No significant ST segment change suggestive of ischemia. Comparison with Prior Nuclear Study: No significant change from previous study  Overall Impression:  Low risk stress nuclear study.  There is no evidence of ischemia.  There is a previous inferior basal MI.    LV Ejection Fraction: 55%.  LV Wall Motion:  NL LV Function; NL Wall Motion.    [name], [name], MD, [facility] 11/21/2011, 5:05  PM

## 2011-11-26 ENCOUNTER — Other Ambulatory Visit: Payer: Self-pay | Admitting: Family Medicine

## 2011-11-26 NOTE — Telephone Encounter (Signed)
eScribe request for refill on Ibuprofen 800 mg Last seen on 09/13/11 for CPE Follow up in 6 months Please advise refills. Thanks

## 2011-11-27 MED ORDER — IBUPROFEN 800 MG PO TABS: ORAL_TABLET | ORAL | Status: DC

## 2011-11-27 NOTE — Telephone Encounter (Signed)
RX sent by Dr. Milinda Cave with refill error notification to my In Basket.  RX re-sent.

## 2011-11-27 NOTE — Telephone Encounter (Signed)
Addended by: Luisa Dago on: 11/27/2011 08:32 AM   Modules accepted: Orders

## 2011-12-17 DIAGNOSIS — H43399 Other vitreous opacities, unspecified eye: Secondary | ICD-10-CM | POA: Diagnosis not present

## 2011-12-17 DIAGNOSIS — H2510 Age-related nuclear cataract, unspecified eye: Secondary | ICD-10-CM | POA: Diagnosis not present

## 2011-12-17 DIAGNOSIS — H43319 Vitreous membranes and strands, unspecified eye: Secondary | ICD-10-CM | POA: Diagnosis not present

## 2011-12-19 DIAGNOSIS — N400 Enlarged prostate without lower urinary tract symptoms: Secondary | ICD-10-CM | POA: Diagnosis not present

## 2012-01-28 ENCOUNTER — Other Ambulatory Visit: Payer: Self-pay | Admitting: Cardiology

## 2012-02-21 DIAGNOSIS — H6120 Impacted cerumen, unspecified ear: Secondary | ICD-10-CM | POA: Diagnosis not present

## 2012-02-21 DIAGNOSIS — J309 Allergic rhinitis, unspecified: Secondary | ICD-10-CM | POA: Diagnosis not present

## 2012-03-02 ENCOUNTER — Other Ambulatory Visit: Payer: Self-pay | Admitting: Cardiology

## 2012-03-02 MED ORDER — METOPROLOL TARTRATE 50 MG PO TABS: 50.0000 mg | ORAL_TABLET | Freq: Two times a day (BID) | ORAL | Status: DC

## 2012-03-02 NOTE — Telephone Encounter (Signed)
Refill request changed to 90 days supply.

## 2012-03-10 ENCOUNTER — Telehealth: Payer: Self-pay | Admitting: Cardiology

## 2012-03-10 NOTE — Telephone Encounter (Signed)
Pt's wife requesting samples crestor 5 mg

## 2012-03-11 NOTE — Telephone Encounter (Signed)
Spoke with pt, aware samples at the front desk for pick up 

## 2012-03-20 DIAGNOSIS — Z85828 Personal history of other malignant neoplasm of skin: Secondary | ICD-10-CM | POA: Diagnosis not present

## 2012-03-20 DIAGNOSIS — D485 Neoplasm of uncertain behavior of skin: Secondary | ICD-10-CM | POA: Diagnosis not present

## 2012-03-20 DIAGNOSIS — L570 Actinic keratosis: Secondary | ICD-10-CM | POA: Diagnosis not present

## 2012-03-20 DIAGNOSIS — L820 Inflamed seborrheic keratosis: Secondary | ICD-10-CM | POA: Diagnosis not present

## 2012-03-20 DIAGNOSIS — D239 Other benign neoplasm of skin, unspecified: Secondary | ICD-10-CM | POA: Diagnosis not present

## 2012-03-20 DIAGNOSIS — L819 Disorder of pigmentation, unspecified: Secondary | ICD-10-CM | POA: Diagnosis not present

## 2012-03-20 DIAGNOSIS — Z85820 Personal history of malignant melanoma of skin: Secondary | ICD-10-CM | POA: Diagnosis not present

## 2012-03-20 DIAGNOSIS — D0460 Carcinoma in situ of skin of unspecified upper limb, including shoulder: Secondary | ICD-10-CM | POA: Diagnosis not present

## 2012-03-20 DIAGNOSIS — B354 Tinea corporis: Secondary | ICD-10-CM | POA: Diagnosis not present

## 2012-03-31 DIAGNOSIS — J309 Allergic rhinitis, unspecified: Secondary | ICD-10-CM | POA: Diagnosis not present

## 2012-03-31 DIAGNOSIS — H6980 Other specified disorders of Eustachian tube, unspecified ear: Secondary | ICD-10-CM | POA: Diagnosis not present

## 2012-04-29 DIAGNOSIS — Z23 Encounter for immunization: Secondary | ICD-10-CM | POA: Diagnosis not present

## 2012-04-29 DIAGNOSIS — J309 Allergic rhinitis, unspecified: Secondary | ICD-10-CM | POA: Diagnosis not present

## 2012-05-06 DIAGNOSIS — L570 Actinic keratosis: Secondary | ICD-10-CM | POA: Diagnosis not present

## 2012-05-06 DIAGNOSIS — D0460 Carcinoma in situ of skin of unspecified upper limb, including shoulder: Secondary | ICD-10-CM | POA: Diagnosis not present

## 2012-05-06 DIAGNOSIS — B079 Viral wart, unspecified: Secondary | ICD-10-CM | POA: Diagnosis not present

## 2012-05-06 DIAGNOSIS — D485 Neoplasm of uncertain behavior of skin: Secondary | ICD-10-CM | POA: Diagnosis not present

## 2012-05-28 ENCOUNTER — Telehealth: Payer: Self-pay | Admitting: Cardiology

## 2012-05-28 NOTE — Telephone Encounter (Signed)
plz return call to patient wife Kathie Rhodes regarding Crestor Samples.

## 2012-05-28 NOTE — Telephone Encounter (Signed)
Spoke with pt, aware crestor samples placed a at the front desk for pick up

## 2012-06-14 ENCOUNTER — Emergency Department (HOSPITAL_COMMUNITY)
Admission: EM | Admit: 2012-06-14 | Discharge: 2012-06-14 | Disposition: A | Discharge status: Home / Self Care | Payer: Medicare Other | Attending: Emergency Medicine | Admitting: Emergency Medicine

## 2012-06-14 ENCOUNTER — Encounter (HOSPITAL_COMMUNITY): Payer: Self-pay | Admitting: Emergency Medicine

## 2012-06-14 ENCOUNTER — Emergency Department (HOSPITAL_COMMUNITY): Payer: Medicare Other

## 2012-06-14 DIAGNOSIS — Z8673 Personal history of transient ischemic attack (TIA), and cerebral infarction without residual deficits: Secondary | ICD-10-CM | POA: Insufficient documentation

## 2012-06-14 DIAGNOSIS — I10 Essential (primary) hypertension: Secondary | ICD-10-CM | POA: Diagnosis not present

## 2012-06-14 DIAGNOSIS — Z87442 Personal history of urinary calculi: Secondary | ICD-10-CM | POA: Insufficient documentation

## 2012-06-14 DIAGNOSIS — Z87898 Personal history of other specified conditions: Secondary | ICD-10-CM | POA: Diagnosis not present

## 2012-06-14 DIAGNOSIS — I2510 Atherosclerotic heart disease of native coronary artery without angina pectoris: Secondary | ICD-10-CM | POA: Diagnosis not present

## 2012-06-14 DIAGNOSIS — Z951 Presence of aortocoronary bypass graft: Secondary | ICD-10-CM | POA: Diagnosis not present

## 2012-06-14 DIAGNOSIS — E785 Hyperlipidemia, unspecified: Secondary | ICD-10-CM | POA: Insufficient documentation

## 2012-06-14 DIAGNOSIS — Z85820 Personal history of malignant melanoma of skin: Secondary | ICD-10-CM | POA: Diagnosis not present

## 2012-06-14 DIAGNOSIS — Z791 Long term (current) use of non-steroidal anti-inflammatories (NSAID): Secondary | ICD-10-CM | POA: Diagnosis not present

## 2012-06-14 DIAGNOSIS — M1990 Unspecified osteoarthritis, unspecified site: Secondary | ICD-10-CM | POA: Insufficient documentation

## 2012-06-14 DIAGNOSIS — G8929 Other chronic pain: Secondary | ICD-10-CM | POA: Diagnosis not present

## 2012-06-14 DIAGNOSIS — Z87448 Personal history of other diseases of urinary system: Secondary | ICD-10-CM | POA: Diagnosis not present

## 2012-06-14 DIAGNOSIS — I82409 Acute embolism and thrombosis of unspecified deep veins of unspecified lower extremity: Secondary | ICD-10-CM | POA: Diagnosis not present

## 2012-06-14 DIAGNOSIS — K5730 Diverticulosis of large intestine without perforation or abscess without bleeding: Secondary | ICD-10-CM | POA: Diagnosis not present

## 2012-06-14 DIAGNOSIS — R109 Unspecified abdominal pain: Secondary | ICD-10-CM | POA: Insufficient documentation

## 2012-06-14 DIAGNOSIS — Z79899 Other long term (current) drug therapy: Secondary | ICD-10-CM | POA: Insufficient documentation

## 2012-06-14 DIAGNOSIS — Z7982 Long term (current) use of aspirin: Secondary | ICD-10-CM | POA: Diagnosis not present

## 2012-06-14 DIAGNOSIS — N281 Cyst of kidney, acquired: Secondary | ICD-10-CM | POA: Diagnosis not present

## 2012-06-14 DIAGNOSIS — I252 Old myocardial infarction: Secondary | ICD-10-CM | POA: Insufficient documentation

## 2012-06-14 DIAGNOSIS — N200 Calculus of kidney: Secondary | ICD-10-CM | POA: Diagnosis not present

## 2012-06-14 LAB — CBC WITH DIFFERENTIAL/PLATELET
Basophils Absolute: 0 10*3/uL (ref 0.0–0.1)
Eosinophils Absolute: 0.1 10*3/uL (ref 0.0–0.7)
Lymphs Abs: 1.7 10*3/uL (ref 0.7–4.0)
MCH: 34.3 pg — ABNORMAL HIGH (ref 26.0–34.0)
Neutrophils Relative %: 67 % (ref 43–77)
Platelets: 152 10*3/uL (ref 150–400)
RBC: 4.31 MIL/uL (ref 4.22–5.81)
RDW: 13 % (ref 11.5–15.5)
WBC: 7.2 10*3/uL (ref 4.0–10.5)

## 2012-06-14 LAB — URINALYSIS, ROUTINE W REFLEX MICROSCOPIC
Leukocytes, UA: NEGATIVE
Protein, ur: NEGATIVE mg/dL
Urobilinogen, UA: 0.2 mg/dL (ref 0.0–1.0)

## 2012-06-14 LAB — POCT I-STAT, CHEM 8
Creatinine, Ser: 1 mg/dL (ref 0.50–1.35)
HCT: 43 % (ref 39.0–52.0)
Hemoglobin: 14.6 g/dL (ref 13.0–17.0)
Potassium: 4.4 mEq/L (ref 3.5–5.1)
Sodium: 141 mEq/L (ref 135–145)
TCO2: 25 mmol/L (ref 0–100)

## 2012-06-14 LAB — BASIC METABOLIC PANEL
Calcium: 9.3 mg/dL (ref 8.4–10.5)
Creatinine, Ser: 0.87 mg/dL (ref 0.50–1.35)
GFR calc non Af Amer: 80 mL/min — ABNORMAL LOW (ref >90)
Glucose, Bld: 101 mg/dL — ABNORMAL HIGH (ref 70–99)
Sodium: 138 mEq/L (ref 135–145)

## 2012-06-14 LAB — URINE MICROSCOPIC-ADD ON

## 2012-06-14 MED ORDER — SODIUM CHLORIDE 0.9 % IV SOLN
Freq: Once | INTRAVENOUS | Status: AC
  Administered 2012-06-14: 10:00:00 via INTRAVENOUS

## 2012-06-14 MED ORDER — IOHEXOL 300 MG/ML  SOLN
20.0000 mL | INTRAMUSCULAR | Status: DC
  Administered 2012-06-14: 20 mL via ORAL

## 2012-06-14 MED ORDER — IOHEXOL 350 MG/ML SOLN
100.0000 mL | Freq: Once | INTRAVENOUS | Status: AC | PRN
  Administered 2012-06-14: 100 mL via INTRAVENOUS

## 2012-06-14 MED ORDER — IOHEXOL 300 MG/ML  SOLN: 20.0000 mL | INTRAMUSCULAR | Status: DC

## 2012-06-14 NOTE — ED Notes (Signed)
PT WAITING FOR PA TO COME GIVE RESULTS AND DISPO

## 2012-06-14 NOTE — ED Notes (Signed)
Pt. Stated, i woke up and had such terrible pain in the lower abd. Area.  I've had kidney stones before. Last normal bowel movement, i went 3 times during the night, it was normal.

## 2012-06-14 NOTE — ED Provider Notes (Signed)
Pt arrived to [facility].  He reports pain is resolved and he feels fine.   Pt is awaiting ct scan.   Pt's labs returned urine shows moderate hemoglobin.  Glucose 101.  CT scan shows  Liver cyst, kidney cyst and 3mm stone in pt's left kidney.   pe  Vitals stable  Abdomen soft nontender,   Pt looks good.   Pt advised to follow up with his primary Md for recheck  Lonia Skinner [place], [place] 06/14/12 0955  Elson Areas, PA 06/14/12 1330  Lonia Skinner [place], [place] 06/14/12 1332

## 2012-06-14 NOTE — ED Provider Notes (Signed)
Medical screening examination/treatment/procedure(s) were conducted as a shared visit with non-physician practitioner(s) and myself.  I personally evaluated the patient during the encounter  Doug Sou, MD 06/14/12 1711

## 2012-06-14 NOTE — ED Notes (Signed)
PA IN TO DISCUSS RESULTS AND DISPO WITH PT

## 2012-06-14 NOTE — ED Provider Notes (Signed)
History     CSN: [id number]  Arrival date & time 06/14/12  0807   First MD Initiated Contact with Patient 06/14/12 0831      Chief Complaint  Patient presents with  . Abdominal Pain    (Consider location/radiation/quality/duration/timing/severity/associated sxs/prior treatment) HPI Complains of bilateral lower abdominal pain which awakened him from sleep 2 AM today. The pain lasts several minutes as sharp and severe Radiated to his penis. Pain lasted several minutes and resolved. He had a recurrence of the pain at approximately 3 AM which lasted until 5 AM today but it resolved again. Without treatment. He is presently asymptomatic no nausea no vomiting last bowel movement last night, normal no other complaint no other associated symptoms Past Medical History  Diagnosis Date  . DVT (deep venous thrombosis) 2009    Post-op from laparoscopic inguinal hernia repair  . CAD (coronary artery disease)     CABG 2008:  myoview  2010 showed no ischemia and a normal EF  . Nephrolithiasis   . Hyperlipidemia   . Hypertension   . Prostate nodule     multiple benign biopsies (Dr Earlene Plater)  . MI (myocardial infarction) 10/14/2006    Inferior.    . Osteoarthritis     Primarily hips  . Melanoma     skin graft surgery required  . Groin pain, left lower quadrant     Chronic, intermittent: believed to be due to scarring around MESH placed at inguinal hernia repair  . CVA (cerebral vascular accident)     possible/no residual deficit--following CABG   . Retroperitoneal hematoma 2009    on lovenox; greenfield filter placed after this  . Diverticulosis of rectosigmoid     Noted on CT 04/2011 done to eval chronic groin pain  . MELANOMA, HX OF 12/02/2008    Past Surgical History  Procedure Date  . Hip replacement bilateral     right was 2004, left 2011  . Rotator cuff repair   . Status post coronary bypass graft 10/14/06     (LIMA to the LAD, saphenous vein graft to the diagonal, saphenous vein  graft to the mid obtuse marginal, saphenous vein graft placed in a sequential fashion ot the RV branch of the right coronary artery and distal right coronary artery  . Carpal tunnel repair b   . Trigger finger x 3   . Kidney stone removal 01/18/88  . Melanoma excision 08/1990    left shoulder - skin graft from leg  . Hernia repair 05/20/2008    open, X 3    Family History  Problem Relation Age of Onset  . Cancer Mother 24    "male"  . Heart attack Father 4  . Heart disease Father   . Heart disease Brother     History  Substance Use Topics  . Smoking status: Never Smoker   . Smokeless tobacco: Never Used  . Alcohol Use: No      Review of Systems  Constitutional: Negative.   HENT: Negative.   Respiratory: Negative.   Cardiovascular: Negative.   Gastrointestinal: Positive for abdominal pain.       Abdominal pain per history of present illness. Patient also suffers from chronic left groin pain for 2 years since he had hernia repair  Musculoskeletal: Negative.   Skin: Negative.   Neurological: Negative.   Hematological: Negative.   Psychiatric/Behavioral: Negative.     Allergies  Clindamycin/lincomycin; Penicillins; and Sulfonamide derivatives  Home Medications   Current Outpatient Rx  Name Route  Sig Dispense Refill  . ASPIRIN 325 MG PO TABS Oral Take 325 mg by mouth daily.      Marland Kitchen DOCUSATE SODIUM 50 MG PO CAPS Oral Take by mouth 2 (two) times daily.    . IBUPROFEN 800 MG PO TABS Oral Take 800 mg by mouth daily.    Marland Kitchen METOPROLOL TARTRATE 50 MG PO TABS Oral Take 1 tablet (50 mg total) by mouth 2 (two) times daily. 60 tablet 2  . ONE-DAILY MULTI VITAMINS PO TABS Oral Take 1 tablet by mouth daily.      Marland Kitchen OMEPRAZOLE 40 MG PO CPDR Oral Take 40 mg by mouth daily.    Marland Kitchen ROSUVASTATIN CALCIUM 5 MG PO TABS  Take 1/2 tab daily 30 tablet 12  . AZITHROMYCIN 250 MG PO TABS  2 tabs po 1 hour prior to dental procedure 2 each 2    BP 150/80  Pulse 56  Temp 98.4 F (36.9 C)  SpO2  97%  Physical Exam  Nursing note and vitals reviewed. Constitutional: He appears well-developed and well-nourished.  HENT:  Head: Normocephalic and atraumatic.  Eyes: Conjunctivae normal are normal. Pupils are equal, round, and reactive to light.  Neck: Neck supple. No tracheal deviation present. No thyromegaly present.  Cardiovascular: Normal rate and regular rhythm.   No murmur heard. Pulmonary/Chest: Effort normal and breath sounds normal.  Abdominal: Soft. Bowel sounds are normal. He exhibits no distension. There is no tenderness.  Genitourinary: Penis normal.  Musculoskeletal: Normal range of motion. He exhibits no edema and no tenderness.  Neurological: He is alert. Coordination normal.  Skin: Skin is warm and dry. No rash noted.  Psychiatric: He has a normal mood and affect.    ED Course  Procedures (including critical care time)   Labs Reviewed  URINALYSIS, ROUTINE W REFLEX MICROSCOPIC  BASIC METABOLIC PANEL  CBC WITH DIFFERENTIAL   No results found.   No diagnosis found.    MDM  Plan CT scan. Transferred to CDU. ED evaluation to include blood work and urinalysis the patient remains feeling well and stable and workup negative he is likely suitable for discharge to home        Doug Sou, MD 06/14/12 [phone]

## 2012-06-19 DIAGNOSIS — L570 Actinic keratosis: Secondary | ICD-10-CM | POA: Diagnosis not present

## 2012-07-21 ENCOUNTER — Other Ambulatory Visit: Payer: Self-pay | Admitting: Cardiology

## 2012-08-25 ENCOUNTER — Other Ambulatory Visit: Payer: Self-pay | Admitting: Cardiology

## 2012-08-25 ENCOUNTER — Telehealth: Payer: Self-pay | Admitting: Cardiology

## 2012-08-25 NOTE — Telephone Encounter (Signed)
Spoke with pt wife, aware crestor 10 mg samples at the front desk for pick up. Voiced understanding to cut tablets in one half.

## 2012-08-25 NOTE — Telephone Encounter (Signed)
New Problem:    Patient's wife called in wanting to know if there were any samples of rosuvastatin (CRESTOR) 5 MG tablet available for her to pick up tomorrow for her husband.  Please call back.

## 2012-08-26 DIAGNOSIS — L820 Inflamed seborrheic keratosis: Secondary | ICD-10-CM | POA: Diagnosis not present

## 2012-08-26 DIAGNOSIS — Z85828 Personal history of other malignant neoplasm of skin: Secondary | ICD-10-CM | POA: Diagnosis not present

## 2012-08-26 DIAGNOSIS — C44621 Squamous cell carcinoma of skin of unspecified upper limb, including shoulder: Secondary | ICD-10-CM | POA: Diagnosis not present

## 2012-08-26 DIAGNOSIS — L570 Actinic keratosis: Secondary | ICD-10-CM | POA: Diagnosis not present

## 2012-08-26 DIAGNOSIS — D485 Neoplasm of uncertain behavior of skin: Secondary | ICD-10-CM | POA: Diagnosis not present

## 2012-08-26 DIAGNOSIS — Z85820 Personal history of malignant melanoma of skin: Secondary | ICD-10-CM | POA: Diagnosis not present

## 2012-09-11 ENCOUNTER — Encounter: Payer: Self-pay | Admitting: Family Medicine

## 2012-09-11 ENCOUNTER — Ambulatory Visit (INDEPENDENT_AMBULATORY_CARE_PROVIDER_SITE_OTHER): Payer: Medicare Other | Admitting: Family Medicine

## 2012-09-11 VITALS — BP 124/76 | HR 62 | Ht 69.0 in | Wt 224.0 lb

## 2012-09-11 DIAGNOSIS — M26629 Arthralgia of temporomandibular joint, unspecified side: Secondary | ICD-10-CM | POA: Diagnosis not present

## 2012-09-11 DIAGNOSIS — I10 Essential (primary) hypertension: Secondary | ICD-10-CM

## 2012-09-11 DIAGNOSIS — E785 Hyperlipidemia, unspecified: Secondary | ICD-10-CM | POA: Diagnosis not present

## 2012-09-11 DIAGNOSIS — M542 Cervicalgia: Secondary | ICD-10-CM | POA: Diagnosis not present

## 2012-09-11 DIAGNOSIS — Z0000 Encounter for general adult medical examination without abnormal findings: Secondary | ICD-10-CM

## 2012-09-11 LAB — CBC WITH DIFFERENTIAL/PLATELET
Basophils Absolute: 0 10*3/uL (ref 0.0–0.1)
Eosinophils Relative: 1.9 % (ref 0.0–5.0)
HCT: 45 % (ref 39.0–52.0)
Lymphocytes Relative: 31.5 % (ref 12.0–46.0)
Monocytes Relative: 9.5 % (ref 3.0–12.0)
Neutrophils Relative %: 56.6 % (ref 43.0–77.0)
Platelets: 158 10*3/uL (ref 150.0–400.0)
RDW: 14 % (ref 11.5–14.6)
WBC: 6 10*3/uL (ref 4.5–10.5)

## 2012-09-11 LAB — COMPREHENSIVE METABOLIC PANEL
AST: 30 U/L (ref 0–37)
Albumin: 4.2 g/dL (ref 3.5–5.2)
Alkaline Phosphatase: 52 U/L (ref 39–117)
BUN: 16 mg/dL (ref 6–23)
Calcium: 9.1 mg/dL (ref 8.4–10.5)
Creatinine, Ser: 0.8 mg/dL (ref 0.4–1.5)
Glucose, Bld: 87 mg/dL (ref 70–99)
Potassium: 4.2 mEq/L (ref 3.5–5.1)

## 2012-09-11 LAB — LIPID PANEL
Cholesterol: 131 mg/dL (ref 0–200)
LDL Cholesterol: 60 mg/dL (ref 0–99)
Triglycerides: 158 mg/dL — ABNORMAL HIGH (ref 0.0–149.0)
VLDL: 31.6 mg/dL (ref 0.0–40.0)

## 2012-09-11 NOTE — Assessment & Plan Note (Signed)
Musculoskeletal c-spine pain, likely combo of osteoarthritis plus relative ligamentous/tendonous immobility. Encouraged pt to do gentle ROM of neck throughout his day.  No meds recommended at this time, but he may use prn tylenol if he wants. He seemed to voice an opinion today that no med was needed at this time. He declined my offer of PT referral for this today.

## 2012-09-11 NOTE — Assessment & Plan Note (Signed)
Left side only.  Believed to be the result of crossbite dental repairs recently. I gave him a handout of some isometric jaw exercises for TMJ today to try at home.

## 2012-09-11 NOTE — Assessment & Plan Note (Addendum)
Reviewed age and gender appropriate health maintenance issues (prudent diet, regular exercise, health risks of tobacco and excessive alcohol, use of seatbelts, fire alarms in home, use of sunscreen).  Also reviewed age and gender appropriate health screening as well as vaccine recommendations. Vacc's are UTD.  Next colonoscopy due 2015. CBC, CMET, and FLP were done today. He is keeping up with appropriate routine f/u with specialists (urol, cardiology, orthopedics).

## 2012-09-11 NOTE — Progress Notes (Signed)
Office Note 09/11/2012  CC:  Chief Complaint  Patient presents with  . Annual Exam    occ neck pain-unable to turn head to right shoulder    HPI:  Bernard Byrd is a 79 y.o. White male who is here for CPE.   Describes some neck discomfort, mainly with ROM, off and on for a couple of years and getting a bit worse gradually.  No weakness, radiation of pain, or paresthesias in arms or shoulders.  He hears popping/grinding with neck movement--does not take any pain med for this.  No hx of neck injury.  Also complains of nagging pain in left TMJ region.  Started after getting his crossbite realigned at his dentist's office.  Had ear pain and went to ENT.  Lots of wax removed but ultimately was told the pain was coming from left sided TMJ area, likely as a result of his dental surgery.  Of note, he still sees Dr. Earlene Plater (urologist) for his prostate checks, has distant hx of prostate nodule that was benign.  Nuclear stress test recently with Dr. Jens Som was good.   Past Medical History  Diagnosis Date  . DVT (deep venous thrombosis) 2009    Post-op from laparoscopic inguinal hernia repair  . CAD (coronary artery disease)     CABG 2008:  myoview  2010 showed no ischemia and a normal EF  . Nephrolithiasis   . Hyperlipidemia   . Hypertension   . Prostate nodule     multiple benign biopsies (Dr Earlene Plater)  . MI (myocardial infarction) 10/14/2006    Inferior.    . Osteoarthritis     Primarily hips  . Melanoma     skin graft surgery required  . Groin pain, left lower quadrant     Chronic, intermittent: believed to be due to scarring around MESH placed at inguinal hernia repair  . CVA (cerebral vascular accident)     possible/no residual deficit--following CABG   . Retroperitoneal hematoma 2009    on lovenox; greenfield filter placed after this  . Diverticulosis of rectosigmoid     Noted on CT 04/2011 done to eval chronic groin pain  . MELANOMA, HX OF 12/02/2008    Past Surgical  History  Procedure Date  . Hip replacement bilateral     right was 2004, left 2011  . Rotator cuff repair   . Status post coronary bypass graft 10/14/06     (LIMA to the LAD, saphenous vein graft to the diagonal, saphenous vein graft to the mid obtuse marginal, saphenous vein graft placed in a sequential fashion ot the RV branch of the right coronary artery and distal right coronary artery  . Carpal tunnel repair b   . Trigger finger x 3   . Kidney stone removal 01/18/88  . Melanoma excision 08/1990    left shoulder - skin graft from leg  . Hernia repair 05/20/2008    open, X 3  . Colonoscopy w/ polypectomy 02/07/09    Adenomatous polyps, severe diverticulosis, internal hemorrhoids.  Repeat 2015    Family History  Problem Relation Age of Onset  . Cancer Mother 19    "male"  . Heart attack Father 37  . Heart disease Father   . Heart disease Brother     History   Social History  . Marital Status: Married    Spouse Name: N/A    Number of Children: N/A  . Years of Education: N/A   Occupational History  . Not on file.  Social History Main Topics  . Smoking status: Never Smoker   . Smokeless tobacco: Never Used  . Alcohol Use: No  . Drug Use: No  . Sexually Active: Not on file   Other Topics Concern  . 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    Outpatient Prescriptions Prior to Visit  Medication Sig Dispense Refill  . aspirin 325 MG tablet Take 325 mg by mouth daily.        Marland Kitchen docusate sodium (COLACE) 50 MG capsule Take by mouth 2 (two) times daily.      Marland Kitchen ibuprofen (ADVIL,MOTRIN) 800 MG tablet Take 800 mg by mouth daily.      . metoprolol (LOPRESSOR) 50 MG tablet Take 1 tablet (50 mg total) by mouth 2 (two) times daily.  60 tablet  2  . Multiple Vitamin (MULTIVITAMIN) tablet Take 1 tablet by mouth daily.        . rosuvastatin (CRESTOR) 5 MG tablet Take 1/2 tab daily  30  tablet  12  . omeprazole (PRILOSEC) 40 MG capsule Take 40 mg by mouth daily.      . [DISCONTINUED] azithromycin (ZITHROMAX) 250 MG tablet 2 tabs po 1 hour prior to dental procedure  2 each  2  . [DISCONTINUED] metoprolol (LOPRESSOR) 50 MG tablet TAKE ONE TABLET BY MOUTH TWICE DAILY  60 tablet  0  Last reviewed on 09/11/2012  9:03 AM by Jeoffrey Massed, MD  Allergies  Allergen Reactions  . Clindamycin/Lincomycin Itching    Difficulty breathing  . Penicillins Hives and Rash  . Sulfonamide Derivatives Hives and Rash    ROS Review of Systems  Constitutional: Negative for fever, chills, appetite change and fatigue.  HENT: Positive for neck pain, neck stiffness and dental problem (see hpi regarding dental issues/TMJ). Negative for ear pain, congestion and sore throat.   Eyes: Negative for discharge, redness and visual disturbance.  Respiratory: Negative for cough, chest tightness, shortness of breath and wheezing.   Cardiovascular: Negative for chest pain, palpitations and leg swelling.  Gastrointestinal: Negative for nausea, vomiting, abdominal pain, diarrhea and blood in stool.  Genitourinary: Negative for dysuria, urgency, frequency, hematuria, flank pain and difficulty urinating.  Musculoskeletal: Negative for myalgias, back pain, joint swelling and arthralgias.  Skin: Negative for pallor and rash.  Neurological: Negative for dizziness, speech difficulty, weakness and headaches.  Hematological: Negative for adenopathy. Does not bruise/bleed easily.  Psychiatric/Behavioral: Negative for confusion and sleep disturbance. The patient is not nervous/anxious.     PE; Blood pressure 124/76, pulse 62, height 5\' 9"  (1.753 m), weight 224 lb (101.606 kg), SpO2 96.00%. Repeat bp in exam room: 124/76. Gen: Alert, well appearing.  Patient is oriented to person, place, time, and situation. AFFECT: pleasant, lucid thought and speech. ENT: Ears: EACs clear, normal epithelium.  TMs with good light  reflex and landmarks bilaterally.  Eyes: no injection, icteris, swelling, or exudate.  EOMI, PERRLA.  Left TMJ mildly TTP, without click pop or notable subluxation. Nose: no drainage or turbinate edema/swelling.  No injection or focal lesion.  Mouth: lips without lesion/swelling.  Oral mucosa pink and moist.  Dentition intact and without obvious caries or gingival swelling.  Oropharynx without erythema, exudate, or swelling.  Neck: supple/nontender.   Forward flexion is intact, but he has very limited ROM in extension, lateral flexion, and rotation due to stiffness/discomfort.  No radiating pain with ROM.  No UE  weakness or sensory loss. No LAD, mass, or TM.   CV: RRR, no m/r/g.   LUNGS: CTA bilat, nonlabored resps, good aeration in all lung fields. ABD: soft, NT, ND, BS normal.  No hepatospenomegaly or mass.  No bruits. EXT: no clubbing or cyanosis.  He has 1-2 + pitting edema in both LE's from mid tibial level down into ankles.  Chronic venous stasis skin hyperpigmentation/freckling is noted. Skin - no sores or suspicious lesions or rashes or color changes   Pertinent labs:  None today  ASSESSMENT AND PLAN:   Cervical spine pain Musculoskeletal c-spine pain, likely combo of osteoarthritis plus relative ligamentous/tendonous immobility. Encouraged pt to do gentle ROM of neck throughout his day.  No meds recommended at this time, but he may use prn tylenol if he wants. He seemed to voice an opinion today that no med was needed at this time. He declined my offer of PT referral for this today.  Health maintenance examination Reviewed age and gender appropriate health maintenance issues (prudent diet, regular exercise, health risks of tobacco and excessive alcohol, use of seatbelts, fire alarms in home, use of sunscreen).  Also reviewed age and gender appropriate health screening as well as vaccine recommendations. Vacc's are UTD.  Next colonoscopy due 2015. CBC, CMET, and FLP were done  today. He is keeping up with appropriate routine f/u with specialists (urol, cardiology, orthopedics).  TMJ arthralgia Left side only.  Believed to be the result of crossbite dental repairs recently. I gave him a handout of some isometric jaw exercises for TMJ today to try at home.  An After Visit Summary was printed and given to the patient.  FOLLOW UP:  Return in about 6 months (around 03/11/2013) for f/u HTN/hyperlipid.

## 2012-09-25 DIAGNOSIS — L820 Inflamed seborrheic keratosis: Secondary | ICD-10-CM | POA: Diagnosis not present

## 2012-09-25 DIAGNOSIS — C44611 Basal cell carcinoma of skin of unspecified upper limb, including shoulder: Secondary | ICD-10-CM | POA: Diagnosis not present

## 2012-09-26 ENCOUNTER — Other Ambulatory Visit: Payer: Self-pay | Admitting: Cardiology

## 2012-10-08 DIAGNOSIS — M654 Radial styloid tenosynovitis [de Quervain]: Secondary | ICD-10-CM | POA: Diagnosis not present

## 2012-10-21 ENCOUNTER — Other Ambulatory Visit: Payer: Self-pay | Admitting: Family Medicine

## 2012-10-21 NOTE — Telephone Encounter (Signed)
Patient is requesting an Rx for a Bayer Contrast E-Z read to be sent to [facility] in [facility] Please contact patient to advise.

## 2012-10-24 ENCOUNTER — Other Ambulatory Visit: Payer: Self-pay | Admitting: Cardiology

## 2012-10-26 MED ORDER — IBUPROFEN 800 MG PO TABS: 800.0000 mg | ORAL_TABLET | Freq: Three times a day (TID) | ORAL | Status: DC | PRN

## 2012-10-26 NOTE — Telephone Encounter (Signed)
Faxed refill request received from pharmacy for IBUPROFEN 800 MG Last filled by MD on 11/27/11, #90 X 3 Last seen on 09/11/12 Follow up 6 MONTHS. Please advise refills.  Pt also requested glucose meter to be called to pharmacy as his last labs showed a borderline reading.  Advised pt to call in RX for meter to have insurance pay I would have to put diabetes diagnoses on the script.  Pt decided he does not want meter.

## 2012-11-17 ENCOUNTER — Telehealth: Payer: Self-pay | Admitting: Cardiology

## 2012-11-17 NOTE — Telephone Encounter (Signed)
Spoke with pt, aware we have samples and I will take them [place].

## 2012-11-17 NOTE — Telephone Encounter (Signed)
New Prob    Requesting some Crestor samples and if they can be taken to [facility] tomorrow.

## 2012-11-18 ENCOUNTER — Ambulatory Visit (INDEPENDENT_AMBULATORY_CARE_PROVIDER_SITE_OTHER): Payer: Medicare Other | Admitting: Cardiology

## 2012-11-18 ENCOUNTER — Encounter: Payer: Self-pay | Admitting: Cardiology

## 2012-11-18 VITALS — BP 100/70 | HR 65 | Wt 224.0 lb

## 2012-11-18 DIAGNOSIS — I10 Essential (primary) hypertension: Secondary | ICD-10-CM

## 2012-11-18 DIAGNOSIS — E7800 Pure hypercholesterolemia, unspecified: Secondary | ICD-10-CM | POA: Diagnosis not present

## 2012-11-18 DIAGNOSIS — I2510 Atherosclerotic heart disease of native coronary artery without angina pectoris: Secondary | ICD-10-CM | POA: Diagnosis not present

## 2012-11-18 NOTE — Patient Instructions (Addendum)
Your physician wants you to follow-up in: ONE YEAR WITH DR CRENSHAW IN [place] You will receive a reminder letter in the mail two months in advance. If you don't receive a letter, please call our office to schedule the follow-up appointment.  

## 2012-11-18 NOTE — Assessment & Plan Note (Signed)
Continue present blood pressure medications. Potassium and renal function monitored by primary care. 

## 2012-11-18 NOTE — Assessment & Plan Note (Signed)
Continue aspirin and statin. 

## 2012-11-18 NOTE — Assessment & Plan Note (Signed)
Continue statin. Lipids and liver monitored by primary care. 

## 2012-11-18 NOTE — Progress Notes (Signed)
HPI: Pleasant male with H/O CAD for followup. H/o inferior MI s/p CABG x 5 in 2/08 w/ ? CVA post CABG. ABIs in August of 2010 were normal. Last nuclear study in April of 2013 showed an ejection fraction of 55%. There was a prior inferior basal infarct but no ischemia. I last saw him in March 2013. Since then the patient denies any dyspnea on exertion, orthopnea, PND, pedal edema, palpitations, syncope or chest pain.   Current Outpatient Prescriptions  Medication Sig Dispense Refill  . aspirin 325 MG tablet Take 325 mg by mouth daily.        Marland Kitchen docusate sodium (COLACE) 50 MG capsule Take by mouth 2 (two) times daily.      Marland Kitchen ibuprofen (ADVIL,MOTRIN) 800 MG tablet Take 1 tablet (800 mg total) by mouth every 8 (eight) hours as needed for pain.  90 tablet  3  . metoprolol (LOPRESSOR) 50 MG tablet TAKE ONE TABLET BY MOUTH TWICE DAILY  60 tablet  0  . Multiple Vitamin (MULTIVITAMIN) tablet Take 1 tablet by mouth daily.        . rosuvastatin (CRESTOR) 5 MG tablet Take 1/2 tab daily  30 tablet  12  . [DISCONTINUED] fexofenadine (ALLEGRA) 180 MG tablet Take 1 tablet (180 mg total) by mouth daily.  30 tablet  11  . [DISCONTINUED] furosemide (LASIX) 20 MG tablet Take 20 mg by mouth as needed.         No current facility-administered medications for this visit.     Past Medical History  Diagnosis Date  . DVT (deep venous thrombosis) 2009    Post-op from laparoscopic inguinal hernia repair  . CAD (coronary artery disease)     CABG 2008:  myoview  2010 showed no ischemia and a normal EF  . Nephrolithiasis   . Hyperlipidemia   . Hypertension   . Prostate nodule     multiple benign biopsies (Dr Earlene Plater)  . MI (myocardial infarction) 10/14/2006    Inferior.    . Osteoarthritis     Primarily hips  . Melanoma     skin graft surgery required  . Groin pain, left lower quadrant     Chronic, intermittent: believed to be due to scarring around MESH placed at inguinal hernia repair  . CVA (cerebral  vascular accident)     possible/no residual deficit--following CABG   . Retroperitoneal hematoma 2009    on lovenox; greenfield filter placed after this  . Diverticulosis of rectosigmoid     Noted on CT 04/2011 done to eval chronic groin pain  . MELANOMA, HX OF 12/02/2008    Past Surgical History  Procedure Laterality Date  . Hip replacement bilateral      right was 2004, left 2011  . Rotator cuff repair    . Status post coronary bypass graft  10/14/06     (LIMA to the LAD, saphenous vein graft to the diagonal, saphenous vein graft to the mid obtuse marginal, saphenous vein graft placed in a sequential fashion ot the RV branch of the right coronary artery and distal right coronary artery  . Carpal tunnel repair b    . Trigger finger x 3    . Kidney stone removal  01/18/88  . Melanoma excision  08/1990    left shoulder - skin graft from leg  . Hernia repair  05/20/2008    open, X 3  . Colonoscopy w/ polypectomy  02/07/09    Adenomatous polyps, severe diverticulosis, internal hemorrhoids.  Repeat  2015    History   Social History  . Marital Status: Married    Spouse Name: N/A    Number of Children: N/A  . Years of Education: N/A   Occupational History  . Not on file.   Social History Main Topics  . Smoking status: Never Smoker   . Smokeless tobacco: Never Used  . Alcohol Use: No  . Drug Use: No  . Sexually Active: Not on file   Other Topics Concern  . 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   Retired Associate Professor.   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   No T/A/D's.    ROS: hip arthralgias but no fevers or chills, productive cough, hemoptysis, dysphasia, odynophagia, melena, hematochezia, dysuria, hematuria, rash, seizure activity, orthopnea, PND, pedal edema, claudication. Remaining systems are negative.  Physical Exam: Well-developed well-nourished in no acute distress.  Skin is warm and dry.  HEENT is normal.  Neck is supple.  Chest  is clear to auscultation with normal expansion.  Cardiovascular exam is regular rate and rhythm.  Abdominal exam nontender or distended. No masses palpated. Extremities show no edema. neuro grossly intact  ECG sinus rhythm at a rate of 65. Nonspecific ST T wave changes.

## 2012-11-26 ENCOUNTER — Telehealth: Payer: Self-pay | Admitting: *Deleted

## 2012-11-26 MED ORDER — IBUPROFEN 800 MG PO TABS: 800.0000 mg | ORAL_TABLET | Freq: Three times a day (TID) | ORAL | Status: DC | PRN

## 2012-11-26 NOTE — Telephone Encounter (Signed)
Faxed refill request received from pharmacy for 90 DAY SUPPLY IBUPROFEN Last filled by MD on 10/26/12, #90 X 3 (TID) Last seen on 09/11/12 Follow up 02/2013 Please advise if 90 day supply OK.

## 2012-11-26 NOTE — Telephone Encounter (Signed)
RX sent

## 2012-11-26 NOTE — Telephone Encounter (Signed)
Yes, ,90 day supply with 1 RF is ok.-thx

## 2012-11-30 ENCOUNTER — Other Ambulatory Visit: Payer: Self-pay | Admitting: *Deleted

## 2012-11-30 MED ORDER — METOPROLOL TARTRATE 50 MG PO TABS: 50.0000 mg | ORAL_TABLET | Freq: Two times a day (BID) | ORAL | Status: DC

## 2012-11-30 NOTE — Telephone Encounter (Signed)
Fax Received. Refill Completed. [facility] [place] (R.M.A)  Pt wife calling for refill for pt

## 2012-12-09 ENCOUNTER — Ambulatory Visit: Payer: Medicare Other | Admitting: Cardiology

## 2012-12-21 DIAGNOSIS — H35039 Hypertensive retinopathy, unspecified eye: Secondary | ICD-10-CM | POA: Diagnosis not present

## 2012-12-21 DIAGNOSIS — T7840XA Allergy, unspecified, initial encounter: Secondary | ICD-10-CM | POA: Diagnosis not present

## 2012-12-21 DIAGNOSIS — H2510 Age-related nuclear cataract, unspecified eye: Secondary | ICD-10-CM | POA: Diagnosis not present

## 2012-12-25 DIAGNOSIS — N400 Enlarged prostate without lower urinary tract symptoms: Secondary | ICD-10-CM | POA: Diagnosis not present

## 2013-03-11 ENCOUNTER — Ambulatory Visit (INDEPENDENT_AMBULATORY_CARE_PROVIDER_SITE_OTHER): Payer: Medicare Other | Admitting: Family Medicine

## 2013-03-11 ENCOUNTER — Encounter: Payer: Self-pay | Admitting: Family Medicine

## 2013-03-11 VITALS — BP 169/83 | HR 60 | Temp 98.4°F | Resp 18 | Ht 69.0 in | Wt 228.0 lb

## 2013-03-11 DIAGNOSIS — E785 Hyperlipidemia, unspecified: Secondary | ICD-10-CM | POA: Diagnosis not present

## 2013-03-11 DIAGNOSIS — I10 Essential (primary) hypertension: Secondary | ICD-10-CM

## 2013-03-11 DIAGNOSIS — I2510 Atherosclerotic heart disease of native coronary artery without angina pectoris: Secondary | ICD-10-CM

## 2013-03-11 NOTE — Assessment & Plan Note (Signed)
Problem stable.  Continue current medications and diet appropriate for this condition.  We have reviewed our general long term plan for this problem and also reviewed symptoms and signs that should prompt the patient to call or return to the office. Keep approp cardiology f/u.

## 2013-03-11 NOTE — Progress Notes (Signed)
OFFICE NOTE  03/11/2013  CC:  Chief Complaint  Patient presents with  . Follow-up    6 months     HPI: Patient is a 79 y.o. Caucasian male who is here for 6 mo f/u HTN, hyperlipidemia, hx of CAD. Denies any complaints today.  Home/outside bp checks are always normal. Pt compliant with meds but admits he isn't compliant with a great low chol/low fat or low sodium diet.  Pertinent PMH:  Past Medical History  Diagnosis Date  . DVT (deep venous thrombosis) 2009    Post-op from laparoscopic inguinal hernia repair  . CAD (coronary artery disease)     CABG 2008:  myoview  2010 showed no ischemia and a normal EF  . Nephrolithiasis   . Hyperlipidemia   . Hypertension   . Prostate nodule     multiple benign biopsies (Dr Earlene Plater)  . MI (myocardial infarction) 10/14/2006    Inferior.    . Osteoarthritis     Primarily hips  . Melanoma     skin graft surgery required  . Groin pain, left lower quadrant     Chronic, intermittent: believed to be due to scarring around MESH placed at inguinal hernia repair  . CVA (cerebral vascular accident)     possible/no residual deficit--following CABG   . Retroperitoneal hematoma 2009    on lovenox; greenfield filter placed after this  . Diverticulosis of rectosigmoid     Noted on CT 04/2011 done to eval chronic groin pain  . MELANOMA, HX OF 12/02/2008   Past Surgical History  Procedure Laterality Date  . Hip replacement bilateral      right was 2004, left 2011  . Rotator cuff repair    . Status post coronary bypass graft  10/14/06     (LIMA to the LAD, saphenous vein graft to the diagonal, saphenous vein graft to the mid obtuse marginal, saphenous vein graft placed in a sequential fashion ot the RV branch of the right coronary artery and distal right coronary artery  . Carpal tunnel repair b    . Trigger finger x 3    . Kidney stone removal  01/18/88  . Melanoma excision  08/1990    left shoulder - skin graft from leg  . Hernia repair  05/20/2008    open, X 3  . Colonoscopy w/ polypectomy  02/07/09    Adenomatous polyps, severe diverticulosis, internal hemorrhoids.  Repeat 2015    MEDS:  Outpatient Prescriptions Prior to Visit  Medication Sig Dispense Refill  . aspirin 325 MG tablet Take 325 mg by mouth daily.        . metoprolol (LOPRESSOR) 50 MG tablet Take 1 tablet (50 mg total) by mouth 2 (two) times daily.  180 tablet  3  . Multiple Vitamin (MULTIVITAMIN) tablet Take 1 tablet by mouth daily.        . rosuvastatin (CRESTOR) 5 MG tablet Take 1/2 tab daily  30 tablet  12  . docusate sodium (COLACE) 50 MG capsule Take by mouth 2 (two) times daily.      Marland Kitchen ibuprofen (ADVIL,MOTRIN) 800 MG tablet Take 1 tablet (800 mg total) by mouth every 8 (eight) hours as needed for pain.  270 tablet  1   No facility-administered medications prior to visit.    PE: Blood pressure 169/83, pulse 60, temperature 98.4 F (36.9 C), temperature source Temporal, resp. rate 18, height 5\' 9"  (1.753 m), weight 228 lb (103.42 kg), SpO2 96.00%.  118/76 on manual bp check  in right arm. Gen: Alert, well appearing.  Patient is oriented to person, place, time, and situation. CV: RRR, no m/r/g.   LUNGS: CTA bilat, nonlabored resps, good aeration in all lung fields. EXT: 1+ bilat pretib/ankle edema  IMPRESSION AND PLAN:  HTN (hypertension), benign Problem stable.  Continue current medications and diet appropriate for this condition.  We have reviewed our general long term plan for this problem and also reviewed symptoms and signs that should prompt the patient to call or return to the office. He'll monitor bp at home a couple times a month and call if persistently >140/90.  Hyperlipidemia Stable.  Tolerating meds well. Next lipid check in 93mo.  CAD Problem stable.  Continue current medications and diet appropriate for this condition.  We have reviewed our general long term plan for this problem and also reviewed symptoms and signs that should prompt the  patient to call or return to the office. Keep approp cardiology f/u.   An After Visit Summary was printed and given to the patient.  FOLLOW UP: 93mo for CPE with fasting labs

## 2013-03-11 NOTE — Assessment & Plan Note (Signed)
Problem stable.  Continue current medications and diet appropriate for this condition.  We have reviewed our general long term plan for this problem and also reviewed symptoms and signs that should prompt the patient to call or return to the office. He'll monitor bp at home a couple times a month and call if persistently >140/90.

## 2013-03-11 NOTE — Assessment & Plan Note (Signed)
Stable.  Tolerating meds well. Next lipid check in 35mo.

## 2013-03-24 DIAGNOSIS — L819 Disorder of pigmentation, unspecified: Secondary | ICD-10-CM | POA: Diagnosis not present

## 2013-03-24 DIAGNOSIS — Z85828 Personal history of other malignant neoplasm of skin: Secondary | ICD-10-CM | POA: Diagnosis not present

## 2013-03-24 DIAGNOSIS — L570 Actinic keratosis: Secondary | ICD-10-CM | POA: Diagnosis not present

## 2013-03-24 DIAGNOSIS — D239 Other benign neoplasm of skin, unspecified: Secondary | ICD-10-CM | POA: Diagnosis not present

## 2013-03-24 DIAGNOSIS — Z85820 Personal history of malignant melanoma of skin: Secondary | ICD-10-CM | POA: Diagnosis not present

## 2013-04-20 DIAGNOSIS — M25559 Pain in unspecified hip: Secondary | ICD-10-CM | POA: Diagnosis not present

## 2013-04-22 ENCOUNTER — Other Ambulatory Visit: Payer: Self-pay | Admitting: Orthopedic Surgery

## 2013-04-23 ENCOUNTER — Other Ambulatory Visit: Payer: Self-pay | Admitting: Orthopedic Surgery

## 2013-04-23 DIAGNOSIS — M25551 Pain in right hip: Secondary | ICD-10-CM

## 2013-05-05 ENCOUNTER — Ambulatory Visit
Admission: RE | Admit: 2013-05-05 | Discharge: 2013-05-05 | Disposition: A | Discharge status: Home / Self Care | Payer: Medicare Other | Source: Ambulatory Visit | Attending: Orthopedic Surgery | Admitting: Orthopedic Surgery

## 2013-05-05 ENCOUNTER — Other Ambulatory Visit: Payer: Self-pay | Admitting: Orthopedic Surgery

## 2013-05-05 DIAGNOSIS — Z135 Encounter for screening for eye and ear disorders: Secondary | ICD-10-CM | POA: Diagnosis not present

## 2013-05-05 DIAGNOSIS — Z77018 Contact with and (suspected) exposure to other hazardous metals: Secondary | ICD-10-CM

## 2013-05-05 DIAGNOSIS — M25559 Pain in unspecified hip: Secondary | ICD-10-CM | POA: Diagnosis not present

## 2013-05-05 DIAGNOSIS — M25551 Pain in right hip: Secondary | ICD-10-CM

## 2013-05-10 DIAGNOSIS — Z23 Encounter for immunization: Secondary | ICD-10-CM | POA: Diagnosis not present

## 2013-06-10 DIAGNOSIS — M25559 Pain in unspecified hip: Secondary | ICD-10-CM | POA: Diagnosis not present

## 2013-06-29 ENCOUNTER — Telehealth: Payer: Self-pay

## 2013-06-29 NOTE — Telephone Encounter (Signed)
Samples of crestor

## 2013-08-19 DIAGNOSIS — H2513 Age-related nuclear cataract, bilateral: Secondary | ICD-10-CM

## 2013-08-19 HISTORY — DX: Age-related nuclear cataract, bilateral: H25.13

## 2013-09-15 ENCOUNTER — Encounter: Payer: Self-pay | Admitting: Family Medicine

## 2013-09-15 ENCOUNTER — Ambulatory Visit (INDEPENDENT_AMBULATORY_CARE_PROVIDER_SITE_OTHER): Payer: Medicare Other | Admitting: Family Medicine

## 2013-09-15 VITALS — BP 144/86 | HR 62 | Temp 98.4°F | Resp 18 | Ht 69.0 in | Wt 226.0 lb

## 2013-09-15 DIAGNOSIS — Z23 Encounter for immunization: Secondary | ICD-10-CM | POA: Diagnosis not present

## 2013-09-15 DIAGNOSIS — I10 Essential (primary) hypertension: Secondary | ICD-10-CM | POA: Diagnosis not present

## 2013-09-15 DIAGNOSIS — E785 Hyperlipidemia, unspecified: Secondary | ICD-10-CM

## 2013-09-15 DIAGNOSIS — I2510 Atherosclerotic heart disease of native coronary artery without angina pectoris: Secondary | ICD-10-CM

## 2013-09-15 LAB — COMPREHENSIVE METABOLIC PANEL
ALBUMIN: 4 g/dL (ref 3.5–5.2)
ALT: 42 U/L (ref 0–53)
AST: 36 U/L (ref 0–37)
Alkaline Phosphatase: 59 U/L (ref 39–117)
BUN: 15 mg/dL (ref 6–23)
CALCIUM: 9.4 mg/dL (ref 8.4–10.5)
CHLORIDE: 106 meq/L (ref 96–112)
CO2: 23 meq/L (ref 19–32)
Creatinine, Ser: 0.9 mg/dL (ref 0.4–1.5)
GFR: 90.82 mL/min (ref >60.00)
GLUCOSE: 83 mg/dL (ref 70–99)
POTASSIUM: 4.3 meq/L (ref 3.5–5.1)
SODIUM: 138 meq/L (ref 135–145)
TOTAL PROTEIN: 6.8 g/dL (ref 6.0–8.3)
Total Bilirubin: 0.8 mg/dL (ref 0.3–1.2)

## 2013-09-15 LAB — CBC WITH DIFFERENTIAL/PLATELET
BASOS ABS: 0 10*3/uL (ref 0.0–0.1)
Basophils Relative: 0.6 % (ref 0.0–3.0)
EOS ABS: 0.1 10*3/uL (ref 0.0–0.7)
Eosinophils Relative: 1.9 % (ref 0.0–5.0)
HEMATOCRIT: 48.7 % (ref 39.0–52.0)
Hemoglobin: 16 g/dL (ref 13.0–17.0)
LYMPHS ABS: 2 10*3/uL (ref 0.7–4.0)
Lymphocytes Relative: 30.7 % (ref 12.0–46.0)
MCHC: 32.9 g/dL (ref 30.0–36.0)
MCV: 105 fl — ABNORMAL HIGH (ref 78.0–100.0)
MONO ABS: 0.6 10*3/uL (ref 0.1–1.0)
MONOS PCT: 10.1 % (ref 3.0–12.0)
Neutro Abs: 3.6 10*3/uL (ref 1.4–7.7)
Neutrophils Relative %: 56.7 % (ref 43.0–77.0)
PLATELETS: 158 10*3/uL (ref 150.0–400.0)
RBC: 4.64 Mil/uL (ref 4.22–5.81)
RDW: 14.3 % (ref 11.5–14.6)
WBC: 6.4 10*3/uL (ref 4.5–10.5)

## 2013-09-15 LAB — LIPID PANEL
Cholesterol: 144 mg/dL (ref 0–200)
HDL: 39.5 mg/dL (ref >39.00)
LDL Cholesterol: 70 mg/dL (ref 0–99)
Total CHOL/HDL Ratio: 4
Triglycerides: 172 mg/dL — ABNORMAL HIGH (ref 0.0–149.0)
VLDL: 34.4 mg/dL (ref 0.0–40.0)

## 2013-09-15 LAB — TSH: TSH: 1.77 u[IU]/mL (ref 0.35–5.50)

## 2013-09-15 NOTE — Progress Notes (Signed)
Office Note 09/15/2013  CC:  Chief Complaint  Patient presents with  . Annual Exam    HPI:  Bernard Byrd is a 80 y.o. White male who is here for annual CPE. Walks a little bit every hour in daytime. Dieting "a little bit". Passed a kidney stone since he has been here last. He is up-beat as usual, no acute complaints.  No home bps to report.  Past Medical History  Diagnosis Date  . DVT (deep venous thrombosis) 2009    Post-op from laparoscopic inguinal hernia repair  . CAD (coronary artery disease)     CABG 2008:  myoview  2010 showed no ischemia and a normal EF  . Nephrolithiasis   . Hyperlipidemia   . Hypertension   . Prostate nodule     multiple benign biopsies (Dr Rosana Hoes)  . MI (myocardial infarction) 10/14/2006    Inferior.    . Osteoarthritis     Primarily hips  . Melanoma     skin graft surgery required  . Groin pain, left lower quadrant     Chronic, intermittent: believed to be due to scarring around MESH placed at inguinal hernia repair  . CVA (cerebral vascular accident)     possible/no residual deficit--following CABG   . Retroperitoneal hematoma 2009    on lovenox; greenfield filter placed after this  . Diverticulosis of rectosigmoid     Noted on CT 04/2011 done to eval chronic groin pain  . MELANOMA, HX OF 12/02/2008    Past Surgical History  Procedure Laterality Date  . Hip replacement bilateral      right was 2004, left 2011  . Rotator cuff repair    . Status post coronary bypass graft  10/14/06     (LIMA to the LAD, saphenous vein graft to the diagonal, saphenous vein graft to the mid obtuse marginal, saphenous vein graft placed in a sequential fashion ot the RV branch of the right coronary artery and distal right coronary artery  . Carpal tunnel repair b    . Trigger finger x 3    . Kidney stone removal  01/18/88  . Melanoma excision  08/1990    left shoulder - skin graft from leg  . Hernia repair  05/20/2008    open, X 3  . Colonoscopy w/  polypectomy  02/07/09    Adenomatous polyps, severe diverticulosis, internal hemorrhoids.  Repeat 2015  . Greenfield filter      Family History  Problem Relation Age of Onset  . Cancer Mother 31    "male"  . Heart attack Father 47  . Heart disease Father   . Heart disease Brother     History   Social History  . Marital Status: Married    Spouse Name: N/A    Number of Children: N/A  . Years of Education: N/A   Occupational History  . Not on file.   Social History Main Topics  . Smoking status: Never Smoker   . Smokeless tobacco: Never Used  . Alcohol Use: No  . Drug Use: No  . Sexual Activity: Not on file   Other Topics Concern  . 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   No T/A/D's.    Outpatient Prescriptions Prior to Visit  Medication Sig Dispense Refill  . aspirin 325 MG tablet Take 325 mg by mouth daily.        Marland Kitchen  docusate sodium (COLACE) 50 MG capsule Take by mouth 2 (two) times daily.      Marland Kitchen ibuprofen (ADVIL,MOTRIN) 800 MG tablet Take 1 tablet (800 mg total) by mouth every 8 (eight) hours as needed for pain.  270 tablet  1  . metoprolol (LOPRESSOR) 50 MG tablet Take 1 tablet (50 mg total) by mouth 2 (two) times daily.  180 tablet  3  . Multiple Vitamin (MULTIVITAMIN) tablet Take 1 tablet by mouth daily.        . rosuvastatin (CRESTOR) 5 MG tablet Take 1/2 tab daily  30 tablet  12   No facility-administered medications prior to visit.    Allergies  Allergen Reactions  . Clindamycin/Lincomycin Itching    Difficulty breathing  . Penicillins Hives and Rash  . Sulfonamide Derivatives Hives and Rash    ROS Review of Systems  Constitutional: Negative for fever, chills, appetite change and fatigue.  HENT: Negative for congestion, dental problem, ear pain and sore throat.   Eyes: Negative for discharge, redness and visual disturbance.  Respiratory: Negative for  cough, chest tightness, shortness of breath and wheezing.   Cardiovascular: Negative for chest pain, palpitations and leg swelling.  Gastrointestinal: Negative for nausea, vomiting, abdominal pain, diarrhea and blood in stool.  Genitourinary: Negative for dysuria, urgency, frequency, hematuria, flank pain and difficulty urinating.  Musculoskeletal: Positive for arthralgias (chronic knees, hips, shoulders). Negative for back pain, joint swelling, myalgias and neck stiffness.  Skin: Negative for pallor and rash.  Neurological: Negative for dizziness, speech difficulty, weakness and headaches.  Hematological: Negative for adenopathy. Does not bruise/bleed easily.  Psychiatric/Behavioral: Negative for confusion and sleep disturbance. The patient is not nervous/anxious.      PE; Blood pressure 144/86, pulse 62, temperature 98.4 F (36.9 C), temperature source Temporal, resp. rate 18, height 5\' 9"  (1.753 m), weight 226 lb (102.513 kg), SpO2 96.00%. Gen: Alert, well appearing.  Patient is oriented to person, place, time, and situation. AFFECT: pleasant, lucid thought and speech. ENT: Ears: EACs clear, normal epithelium.  TMs with good light reflex and landmarks bilaterally.  Eyes: no injection, icteris, swelling, or exudate.  EOMI, PERRLA. Nose: no drainage or turbinate edema/swelling.  No injection or focal lesion.  Mouth: lips without lesion/swelling.  Oral mucosa pink and moist.  Dentition intact and without obvious caries or gingival swelling.  Oropharynx without erythema, exudate, or swelling.  Neck: supple/nontender.  No LAD, mass, or TM.  Carotid pulses 2+ bilaterally, without bruits. CV: RRR, no m/r/g.   LUNGS: CTA bilat, nonlabored resps, good aeration in all lung fields. ABD: soft, NT, ND, BS normal.  No hepatospenomegaly or mass.  No bruits. EXT: no clubbing, cyanosis, or edema.  Musculoskeletal: no joint swelling, erythema, warmth, or tenderness.  ROM of all joints intact. Skin - no  sores or suspicious lesions or rashes or color changes Neuro: CN 2-12 intact bilaterally, strength 5/5 in proximal and distal upper extremities and lower extremities bilaterally. He has limitation of left hip flexion secondary to some left groin discomfort that is chronic  No tremor.  No disdiadochokinesis.  No ataxia.  Upper extremity and lower extremity DTRs symmetric (cannot elicit any DTRs on either side)  No pronator drift.   Pertinent labs:  None today  ASSESSMENT AND PLAN:   HTN (hypertension), benign Problem stable.  Continue current medications and diet appropriate for this condition.  We have reviewed our general long term plan for this problem and also reviewed symptoms and signs that should prompt the patient  to call or return to the office. Check lytes/cr today.  Hyperlipidemia The current medical regimen is effective;  continue present plan and medications. FLP and hepatic panel today.  CAD (coronary artery disease) Asymptomatic. Continue statin, beta blocker, ASA.   Health maintenance exam/preventative health: fasting health panel done today. Prevnar 13 IM given today. Due for recall for colonoscopy this year.  An After Visit Summary was printed and given to the patient.  FOLLOW UP:  Return in about 6 months (around 03/15/2014) for f/u HTN, hyperlip, CAD.

## 2013-09-15 NOTE — Assessment & Plan Note (Signed)
Problem stable.  Continue current medications and diet appropriate for this condition.  We have reviewed our general long term plan for this problem and also reviewed symptoms and signs that should prompt the patient to call or return to the office. Check lytes/cr today.  

## 2013-09-15 NOTE — Assessment & Plan Note (Signed)
The current medical regimen is effective;  continue present plan and medications. FLP and hepatic panel today.

## 2013-09-15 NOTE — Progress Notes (Signed)
Pre visit review using our clinic review tool, if applicable. No additional management support is needed unless otherwise documented below in the visit note. 

## 2013-09-15 NOTE — Assessment & Plan Note (Signed)
Asymptomatic. Continue statin, beta blocker, ASA.

## 2013-09-20 ENCOUNTER — Telehealth: Payer: Self-pay | Admitting: Family Medicine

## 2013-09-20 NOTE — Telephone Encounter (Signed)
Relevant patient education mailed to patient.  

## 2013-10-22 ENCOUNTER — Telehealth: Payer: Self-pay | Admitting: *Deleted

## 2013-10-22 NOTE — Telephone Encounter (Signed)
Patients wife requests crestor samples. She is aware that they will be at the front desk.

## 2013-11-22 ENCOUNTER — Other Ambulatory Visit: Payer: Self-pay | Admitting: Cardiology

## 2013-11-23 ENCOUNTER — Other Ambulatory Visit: Payer: Self-pay | Admitting: Cardiology

## 2013-12-13 ENCOUNTER — Telehealth: Payer: Self-pay | Admitting: *Deleted

## 2013-12-13 NOTE — Telephone Encounter (Signed)
Patients wife request crestor samples for patient. I will place at the front desk for pick up.

## 2013-12-15 ENCOUNTER — Ambulatory Visit (INDEPENDENT_AMBULATORY_CARE_PROVIDER_SITE_OTHER): Payer: Medicare Other | Admitting: Cardiology

## 2013-12-15 ENCOUNTER — Encounter: Payer: Self-pay | Admitting: Cardiology

## 2013-12-15 VITALS — BP 130/80 | HR 64 | Ht 71.0 in | Wt 221.0 lb

## 2013-12-15 DIAGNOSIS — E785 Hyperlipidemia, unspecified: Secondary | ICD-10-CM

## 2013-12-15 DIAGNOSIS — I2510 Atherosclerotic heart disease of native coronary artery without angina pectoris: Secondary | ICD-10-CM | POA: Diagnosis not present

## 2013-12-15 DIAGNOSIS — I10 Essential (primary) hypertension: Secondary | ICD-10-CM | POA: Diagnosis not present

## 2013-12-15 NOTE — Patient Instructions (Signed)
Your physician wants you to follow-up in: ONE YEAR WITH DR CRENSHAW You will receive a reminder letter in the mail two months in advance. If you don't receive a letter, please call our office to schedule the follow-up appointment.  

## 2013-12-15 NOTE — Progress Notes (Signed)
HPI: Pleasant male with H/O CAD for followup. H/o inferior MI s/p CABG x 5 in 2/08 w/ ? CVA post CABG. ABIs in August of 2010 were normal. Last nuclear study in April of 2013 showed an ejection fraction of 55%. There was a prior inferior basal infarct but no ischemia. I last saw him in April 2014. Since then the patient denies any dyspnea on exertion, orthopnea, PND, pedal edema, palpitations, syncope or chest pain.   Current Outpatient Prescriptions  Medication Sig Dispense Refill  . aspirin 325 MG tablet Take 325 mg by mouth daily.        Marland Kitchen docusate sodium (COLACE) 50 MG capsule Take by mouth 2 (two) times daily.      Marland Kitchen ibuprofen (ADVIL,MOTRIN) 800 MG tablet Take 1 tablet (800 mg total) by mouth every 8 (eight) hours as needed for pain.  270 tablet  1  . metoprolol (LOPRESSOR) 50 MG tablet TAKE ONE TABLET BY MOUTH TWICE DAILY  180 tablet  0  . Multiple Vitamin (MULTIVITAMIN) tablet Take 1 tablet by mouth daily.        . rosuvastatin (CRESTOR) 5 MG tablet Take 1/2 tab daily  30 tablet  12  . [DISCONTINUED] fexofenadine (ALLEGRA) 180 MG tablet Take 1 tablet (180 mg total) by mouth daily.  30 tablet  11  . [DISCONTINUED] furosemide (LASIX) 20 MG tablet Take 20 mg by mouth as needed.         No current facility-administered medications for this visit.     Past Medical History  Diagnosis Date  . DVT (deep venous thrombosis) 2009    Post-op from laparoscopic inguinal hernia repair  . CAD (coronary artery disease)     CABG 2008:  myoview  2010 showed no ischemia and a normal EF  . Nephrolithiasis   . Hyperlipidemia   . Hypertension   . Prostate nodule     multiple benign biopsies (Dr Rosana Hoes)  . MI (myocardial infarction) 10/14/2006    Inferior.    . Osteoarthritis     Primarily hips  . Melanoma     skin graft surgery required  . Groin pain, left lower quadrant     Chronic, intermittent: believed to be due to scarring around MESH placed at inguinal hernia repair  . CVA  (cerebral vascular accident)     possible/no residual deficit--following CABG   . Retroperitoneal hematoma 2009    on lovenox; greenfield filter placed after this  . Diverticulosis of rectosigmoid     Noted on CT 04/2011 done to eval chronic groin pain  . MELANOMA, HX OF 12/02/2008    Past Surgical History  Procedure Laterality Date  . Hip replacement bilateral      right was 2004, left 2011  . Rotator cuff repair    . Status post coronary bypass graft  10/14/06     (LIMA to the LAD, saphenous vein graft to the diagonal, saphenous vein graft to the mid obtuse marginal, saphenous vein graft placed in a sequential fashion ot the RV branch of the right coronary artery and distal right coronary artery  . Carpal tunnel repair b    . Trigger finger x 3    . Kidney stone removal  01/18/88  . Melanoma excision  08/1990    left shoulder - skin graft from leg  . Hernia repair  05/20/2008    open, X 3  . Colonoscopy w/ polypectomy  02/07/09    Adenomatous polyps, severe diverticulosis, internal  hemorrhoids.  Repeat 2015  . Greenfield filter      History   Social History  . Marital Status: Married    Spouse Name: N/A    Number of Children: N/A  . Years of Education: N/A   Occupational History  . Not on file.   Social History Main Topics  . Smoking status: Never Smoker   . Smokeless tobacco: Never Used  . Alcohol Use: No  . Drug Use: No  . Sexual Activity: Not on file   Other Topics Concern  . 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   No T/A/D's.    ROS: Hip pain but no fevers or chills, productive cough, hemoptysis, dysphasia, odynophagia, melena, hematochezia, dysuria, hematuria, rash, seizure activity, orthopnea, PND, pedal edema, claudication. Remaining systems are negative.  Physical Exam: Well-developed well-nourished in no acute distress.  Skin is warm and dry.  HEENT is normal.   Neck is supple.  Chest is clear to auscultation with normal expansion.  Cardiovascular exam is regular rate and rhythm. 2/6 systolic murmur left sternal border. Abdominal exam nontender or distended. No masses palpated. Extremities show no edema. neuro grossly intact  ECG Sinus rhythm at a rate of 64. Prior inferior infarct. Left ventricular hypertrophy.

## 2013-12-15 NOTE — Assessment & Plan Note (Signed)
Continue statin. 

## 2013-12-15 NOTE — Assessment & Plan Note (Signed)
Continue aspirin and statin. 

## 2013-12-15 NOTE — Assessment & Plan Note (Signed)
Blood pressure controlled. Continue present medications. 

## 2013-12-29 DIAGNOSIS — N400 Enlarged prostate without lower urinary tract symptoms: Secondary | ICD-10-CM | POA: Diagnosis not present

## 2014-01-13 ENCOUNTER — Encounter: Payer: Self-pay | Admitting: Internal Medicine

## 2014-02-08 ENCOUNTER — Telehealth: Payer: Self-pay | Admitting: Internal Medicine

## 2014-02-08 NOTE — Telephone Encounter (Signed)
OK 

## 2014-02-19 ENCOUNTER — Other Ambulatory Visit: Payer: Self-pay | Admitting: Cardiology

## 2014-03-08 DIAGNOSIS — H35039 Hypertensive retinopathy, unspecified eye: Secondary | ICD-10-CM | POA: Diagnosis not present

## 2014-03-08 DIAGNOSIS — H2510 Age-related nuclear cataract, unspecified eye: Secondary | ICD-10-CM | POA: Diagnosis not present

## 2014-03-17 ENCOUNTER — Ambulatory Visit (INDEPENDENT_AMBULATORY_CARE_PROVIDER_SITE_OTHER): Payer: Medicare Other | Admitting: Family Medicine

## 2014-03-17 ENCOUNTER — Encounter: Payer: Self-pay | Admitting: Family Medicine

## 2014-03-17 VITALS — BP 152/79 | HR 58 | Temp 97.5°F | Ht 69.0 in | Wt 228.0 lb

## 2014-03-17 DIAGNOSIS — I2510 Atherosclerotic heart disease of native coronary artery without angina pectoris: Secondary | ICD-10-CM

## 2014-03-17 DIAGNOSIS — E785 Hyperlipidemia, unspecified: Secondary | ICD-10-CM

## 2014-03-17 DIAGNOSIS — I10 Essential (primary) hypertension: Secondary | ICD-10-CM

## 2014-03-17 DIAGNOSIS — I2583 Coronary atherosclerosis due to lipid rich plaque: Principal | ICD-10-CM

## 2014-03-17 NOTE — Progress Notes (Signed)
OFFICE NOTE  03/17/2014  CC:  Chief Complaint  Patient presents with  . Follow-up     HPI: Patient is a 80 y.o. Caucasian male who is here for 6 mo f/u HTN, hyperlipidemia, CAD. I reviewed note from most recent cardiologist f/u on 12/15/13: all stable, no changes in meds. Compliant with all meds.  Home bp monitoring all normal.  Denies CP, SOB, dizziness, or palpitations or LE swelling. No acute complaints, says he feels very well.  He is going for eye eval soon, referred by optometrist for concern of cataracts. Goes to dermatologist next month for eval of flaky papule on right hand.  Pertinent PMH:  Past medical, surgical, social, and family history reviewed and no changes are noted since last office visit.  MEDS:  Outpatient Prescriptions Prior to Visit  Medication Sig Dispense Refill  . aspirin 325 MG tablet Take 325 mg by mouth daily.        Marland Kitchen docusate sodium (COLACE) 50 MG capsule Take by mouth 2 (two) times daily.      Marland Kitchen ibuprofen (ADVIL,MOTRIN) 800 MG tablet Take 1 tablet (800 mg total) by mouth every 8 (eight) hours as needed for pain.  270 tablet  1  . metoprolol (LOPRESSOR) 50 MG tablet TAKE ONE TABLET BY MOUTH TWICE DAILY  180 tablet  1  . Multiple Vitamin (MULTIVITAMIN) tablet Take 1 tablet by mouth daily.        . rosuvastatin (CRESTOR) 5 MG tablet Take 1/2 tab daily  30 tablet  12   No facility-administered medications prior to visit.    PE: Blood pressure 152/79, pulse 58, temperature 97.5 F (36.4 C), temperature source Temporal, height 5\' 9"  (1.753 m), weight 228 lb (103.42 kg), SpO2 94.00%. Gen: Alert, well appearing.  Patient is oriented to person, place, time, and situation. Neck - No masses or thyromegaly or limitation in range of motion CV: RRR, no m/r/g.  Chest wall: right nipple with callus on central portion.  No subQ mass, no tenderness, no erythema. LUNGS: CTA bilat, nonlabored resps, good aeration in all lung fields. EXT: trace pitting edema in  both ankles  LABS: none today  Labs 6 mo ago: Lab Results  Component Value Date   CHOL 144 09/15/2013   HDL 39.50 09/15/2013   LDLCALC 70 09/15/2013   LDLDIRECT 150.7 03/02/2009   TRIG 172.0* 09/15/2013   CHOLHDL 4 09/15/2013     Chemistry      Component Value Date/Time   NA 138 09/15/2013 0918   K 4.3 09/15/2013 0918   CL 106 09/15/2013 0918   CO2 23 09/15/2013 0918   BUN 15 09/15/2013 0918   CREATININE 0.9 09/15/2013 0918      Component Value Date/Time   CALCIUM 9.4 09/15/2013 0918   ALKPHOS 59 09/15/2013 0918   AST 36 09/15/2013 0918   ALT 42 09/15/2013 0918   BILITOT 0.8 09/15/2013 0918     Lab Results  Component Value Date   WBC 6.4 09/15/2013   HGB 16.0 09/15/2013   HCT 48.7 09/15/2013   MCV 105.0* 09/15/2013   PLT 158.0 09/15/2013   Lab Results  Component Value Date   TSH 1.77 09/15/2013      IMPRESSION AND PLAN:  1) CAD: asymptomatic s/p CABG. Cont current meds and appropriate cardiology f/u.  2) HTN: stable as per home monitoring.  3) Hyperlipidemia: The current medical regimen is effective;  continue present plan and medications.  An After Visit Summary was printed and given to  the patient.  FOLLOW UP: 50mo for fasting CPE

## 2014-03-17 NOTE — Progress Notes (Signed)
Pre visit review using our clinic review tool, if applicable. No additional management support is needed unless otherwise documented below in the visit note. 

## 2014-03-30 DIAGNOSIS — Z85828 Personal history of other malignant neoplasm of skin: Secondary | ICD-10-CM | POA: Diagnosis not present

## 2014-03-30 DIAGNOSIS — L819 Disorder of pigmentation, unspecified: Secondary | ICD-10-CM | POA: Diagnosis not present

## 2014-03-30 DIAGNOSIS — L570 Actinic keratosis: Secondary | ICD-10-CM | POA: Diagnosis not present

## 2014-03-30 DIAGNOSIS — Z85820 Personal history of malignant melanoma of skin: Secondary | ICD-10-CM | POA: Diagnosis not present

## 2014-03-30 DIAGNOSIS — L0100 Impetigo, unspecified: Secondary | ICD-10-CM | POA: Diagnosis not present

## 2014-03-30 DIAGNOSIS — D239 Other benign neoplasm of skin, unspecified: Secondary | ICD-10-CM | POA: Diagnosis not present

## 2014-04-04 DIAGNOSIS — H18519 Endothelial corneal dystrophy, unspecified eye: Secondary | ICD-10-CM | POA: Diagnosis not present

## 2014-04-04 DIAGNOSIS — L719 Rosacea, unspecified: Secondary | ICD-10-CM | POA: Diagnosis not present

## 2014-04-04 DIAGNOSIS — H2510 Age-related nuclear cataract, unspecified eye: Secondary | ICD-10-CM | POA: Diagnosis not present

## 2014-04-05 ENCOUNTER — Encounter: Payer: Self-pay | Admitting: Family Medicine

## 2014-04-05 DIAGNOSIS — I110 Hypertensive heart disease with heart failure: Secondary | ICD-10-CM | POA: Diagnosis not present

## 2014-04-05 DIAGNOSIS — I2510 Atherosclerotic heart disease of native coronary artery without angina pectoris: Secondary | ICD-10-CM | POA: Diagnosis not present

## 2014-04-05 DIAGNOSIS — Z951 Presence of aortocoronary bypass graft: Secondary | ICD-10-CM | POA: Diagnosis not present

## 2014-04-05 DIAGNOSIS — Z881 Allergy status to other antibiotic agents status: Secondary | ICD-10-CM | POA: Diagnosis not present

## 2014-04-05 DIAGNOSIS — Z791 Long term (current) use of non-steroidal anti-inflammatories (NSAID): Secondary | ICD-10-CM | POA: Diagnosis not present

## 2014-04-05 DIAGNOSIS — E785 Hyperlipidemia, unspecified: Secondary | ICD-10-CM | POA: Diagnosis not present

## 2014-04-05 DIAGNOSIS — Z96649 Presence of unspecified artificial hip joint: Secondary | ICD-10-CM | POA: Diagnosis not present

## 2014-04-05 DIAGNOSIS — M129 Arthropathy, unspecified: Secondary | ICD-10-CM | POA: Diagnosis not present

## 2014-04-05 DIAGNOSIS — Z7982 Long term (current) use of aspirin: Secondary | ICD-10-CM | POA: Diagnosis not present

## 2014-04-05 DIAGNOSIS — K08109 Complete loss of teeth, unspecified cause, unspecified class: Secondary | ICD-10-CM | POA: Diagnosis not present

## 2014-04-05 DIAGNOSIS — Z880 Allergy status to penicillin: Secondary | ICD-10-CM | POA: Diagnosis not present

## 2014-04-05 DIAGNOSIS — M1990 Unspecified osteoarthritis, unspecified site: Secondary | ICD-10-CM | POA: Diagnosis not present

## 2014-04-05 DIAGNOSIS — Z85820 Personal history of malignant melanoma of skin: Secondary | ICD-10-CM | POA: Diagnosis not present

## 2014-04-05 DIAGNOSIS — I252 Old myocardial infarction: Secondary | ICD-10-CM | POA: Diagnosis not present

## 2014-04-05 DIAGNOSIS — I509 Heart failure, unspecified: Secondary | ICD-10-CM | POA: Diagnosis not present

## 2014-04-05 DIAGNOSIS — L719 Rosacea, unspecified: Secondary | ICD-10-CM | POA: Diagnosis not present

## 2014-04-05 DIAGNOSIS — Z8673 Personal history of transient ischemic attack (TIA), and cerebral infarction without residual deficits: Secondary | ICD-10-CM | POA: Diagnosis not present

## 2014-04-05 DIAGNOSIS — Z87442 Personal history of urinary calculi: Secondary | ICD-10-CM | POA: Diagnosis not present

## 2014-04-05 DIAGNOSIS — H18519 Endothelial corneal dystrophy, unspecified eye: Secondary | ICD-10-CM | POA: Diagnosis not present

## 2014-04-05 DIAGNOSIS — H2510 Age-related nuclear cataract, unspecified eye: Secondary | ICD-10-CM | POA: Diagnosis not present

## 2014-04-05 DIAGNOSIS — R0609 Other forms of dyspnea: Secondary | ICD-10-CM | POA: Diagnosis not present

## 2014-04-05 DIAGNOSIS — Z79899 Other long term (current) drug therapy: Secondary | ICD-10-CM | POA: Diagnosis not present

## 2014-04-06 DIAGNOSIS — H2510 Age-related nuclear cataract, unspecified eye: Secondary | ICD-10-CM | POA: Diagnosis not present

## 2014-04-06 DIAGNOSIS — H25019 Cortical age-related cataract, unspecified eye: Secondary | ICD-10-CM | POA: Diagnosis not present

## 2014-04-26 ENCOUNTER — Ambulatory Visit (INDEPENDENT_AMBULATORY_CARE_PROVIDER_SITE_OTHER): Payer: Medicare Other | Admitting: Family Medicine

## 2014-04-26 ENCOUNTER — Encounter: Payer: Self-pay | Admitting: Family Medicine

## 2014-04-26 VITALS — BP 124/78 | HR 62 | Temp 98.4°F | Resp 18 | Ht 69.0 in | Wt 226.0 lb

## 2014-04-26 DIAGNOSIS — J040 Acute laryngitis: Secondary | ICD-10-CM | POA: Diagnosis not present

## 2014-04-26 DIAGNOSIS — J209 Acute bronchitis, unspecified: Secondary | ICD-10-CM

## 2014-04-26 DIAGNOSIS — I2510 Atherosclerotic heart disease of native coronary artery without angina pectoris: Secondary | ICD-10-CM | POA: Diagnosis not present

## 2014-04-26 MED ORDER — PREDNISONE 20 MG PO TABS: ORAL_TABLET | ORAL | Status: DC

## 2014-04-26 NOTE — Progress Notes (Signed)
Pre visit review using our clinic review tool, if applicable. No additional management support is needed unless otherwise documented below in the visit note. 

## 2014-04-26 NOTE — Progress Notes (Signed)
OFFICE NOTE  04/26/2014  CC:  Chief Complaint  Patient presents with  . Cough  . chest congestion   HPI: Patient is a 80 y.o. Caucasian male who is here for cough.   Onset about 3 d/a, no fever.  Scratchy throat and malaise at onset.  Voice a bit "gruff". No SOB.  Coughing up a lot of green mucous.  Robitussin OTC helping some. Eating and drinking well.   Pertinent PMH:  Past medical, surgical, social, and family history reviewed and no changes are noted since last office visit.  MEDS:  Outpatient Prescriptions Prior to Visit  Medication Sig Dispense Refill  . aspirin 325 MG tablet Take 325 mg by mouth daily.        Marland Kitchen docusate sodium (COLACE) 50 MG capsule Take by mouth 2 (two) times daily.      Marland Kitchen ibuprofen (ADVIL,MOTRIN) 800 MG tablet Take 1 tablet (800 mg total) by mouth every 8 (eight) hours as needed for pain.  270 tablet  1  . metoprolol (LOPRESSOR) 50 MG tablet TAKE ONE TABLET BY MOUTH TWICE DAILY  180 tablet  1  . Multiple Vitamin (MULTIVITAMIN) tablet Take 1 tablet by mouth daily.        . rosuvastatin (CRESTOR) 5 MG tablet Take 1/2 tab daily  30 tablet  12   No facility-administered medications prior to visit.    PE: Blood pressure 124/78, pulse 62, temperature 98.4 F (36.9 C), temperature source Temporal, resp. rate 18, height 5\' 9"  (1.753 m), weight 226 lb (102.513 kg), SpO2 93.00%. Gen: Alert, well appearing.  Patient is oriented to person, place, time, and situation. ENT: Ears: EACs clear, normal epithelium.  TMs with good light reflex and landmarks bilaterally.  Eyes: no injection, icteris, swelling, or exudate.  EOMI, PERRLA. Nose: no drainage or turbinate edema/swelling.  No injection or focal lesion.  Mouth: lips without lesion/swelling.  Oral mucosa pink and moist.  Dentition intact and without obvious caries or gingival swelling.  Oropharynx without erythema, exudate, or swelling.  Neck - No masses or thyromegaly or limitation in range of motion CV: RRR, no  m/r/g.   LUNGS: CTA bilat, nonlabored resps, good aeration in all lung fields.   IMPRESSION AND PLAN:  Acute bronchitis, no sign of RAD. Suspect viral etiology. Discussed option of prednisone 40mg  qd x 5d--ended up printing rx and he'll fill if not feeling improvement in voice and cough in 2d.  Continue robitussin and push fluids, rest. Signs/symptoms to call or return for were reviewed and pt expressed understanding.  An After Visit Summary was printed and given to the patient.  FOLLOW UP: prn

## 2014-05-02 ENCOUNTER — Telehealth: Payer: Self-pay | Admitting: Family Medicine

## 2014-05-02 MED ORDER — AZITHROMYCIN 250 MG PO TABS: ORAL_TABLET | ORAL | Status: DC

## 2014-05-02 NOTE — Telephone Encounter (Signed)
LMOM for pt to CB.  

## 2014-05-02 NOTE — Telephone Encounter (Signed)
Pt's wife returned call and is aware of medication at pharmacy.

## 2014-05-02 NOTE — Telephone Encounter (Signed)
Patient is still coughing up green mucus. Please advise.

## 2014-05-02 NOTE — Telephone Encounter (Signed)
Z-pack eRx'd to pt's pharmacy. 

## 2014-05-04 ENCOUNTER — Ambulatory Visit (INDEPENDENT_AMBULATORY_CARE_PROVIDER_SITE_OTHER)
Admission: RE | Admit: 2014-05-04 | Discharge: 2014-05-04 | Disposition: A | Discharge status: Home / Self Care | Payer: Medicare Other | Source: Ambulatory Visit | Attending: Family Medicine | Admitting: Family Medicine

## 2014-05-04 ENCOUNTER — Telehealth: Payer: Self-pay | Admitting: Family Medicine

## 2014-05-04 DIAGNOSIS — R059 Cough, unspecified: Secondary | ICD-10-CM

## 2014-05-04 DIAGNOSIS — R05 Cough: Secondary | ICD-10-CM

## 2014-05-04 DIAGNOSIS — I10 Essential (primary) hypertension: Secondary | ICD-10-CM | POA: Diagnosis not present

## 2014-05-04 DIAGNOSIS — R042 Hemoptysis: Secondary | ICD-10-CM | POA: Diagnosis not present

## 2014-05-04 NOTE — Telephone Encounter (Signed)
Yes, pls order x-ray and find out if he's having fever or SOB or other new sx's.-thx

## 2014-05-04 NOTE — Telephone Encounter (Signed)
Please advise 

## 2014-05-04 NOTE — Telephone Encounter (Signed)
Only new sxs is SOB.  Denies fever or any other sxs.

## 2014-05-04 NOTE — Telephone Encounter (Signed)
CXR ordered per Dr. Raeanne Barry.

## 2014-05-04 NOTE — Telephone Encounter (Signed)
Patient is coughing up blood this morning. Can he get an order for a chest xray? They would like to go to [facility] radiology. Please contact patient when order is in EPIC.

## 2014-05-10 MED ORDER — LEVOFLOXACIN 500 MG PO TABS: 500.0000 mg | ORAL_TABLET | Freq: Every day | ORAL | Status: DC

## 2014-05-10 NOTE — Telephone Encounter (Signed)
Patient aware of med at pharmacy and instructions to return to office if not much improved in 5 days.

## 2014-05-10 NOTE — Telephone Encounter (Signed)
I sent in rx for a different antibiotic (Levaquin). Can't repeat Z pack like they requested, sorry. If not much improved after 5d on this new antibiotic then needs to return for o/v for recheck.-thx

## 2014-05-10 NOTE — Telephone Encounter (Signed)
Patient's wife LMOM this am stating that pt is still coughing and would like another z-pack.   Please advise.

## 2014-05-18 ENCOUNTER — Ambulatory Visit (INDEPENDENT_AMBULATORY_CARE_PROVIDER_SITE_OTHER): Payer: Medicare Other | Admitting: Family Medicine

## 2014-05-18 ENCOUNTER — Ambulatory Visit (HOSPITAL_BASED_OUTPATIENT_CLINIC_OR_DEPARTMENT_OTHER)
Admission: RE | Admit: 2014-05-18 | Discharge: 2014-05-18 | Disposition: A | Discharge status: Home / Self Care | Payer: Medicare Other | Source: Ambulatory Visit | Attending: Family Medicine | Admitting: Family Medicine

## 2014-05-18 ENCOUNTER — Encounter: Payer: Self-pay | Admitting: Family Medicine

## 2014-05-18 VITALS — BP 124/77 | HR 56 | Temp 97.8°F | Ht 69.0 in | Wt 223.0 lb

## 2014-05-18 DIAGNOSIS — M25519 Pain in unspecified shoulder: Secondary | ICD-10-CM | POA: Insufficient documentation

## 2014-05-18 DIAGNOSIS — M949 Disorder of cartilage, unspecified: Secondary | ICD-10-CM | POA: Diagnosis not present

## 2014-05-18 DIAGNOSIS — M898X1 Other specified disorders of bone, shoulder: Secondary | ICD-10-CM

## 2014-05-18 DIAGNOSIS — Z23 Encounter for immunization: Secondary | ICD-10-CM

## 2014-05-18 DIAGNOSIS — M436 Torticollis: Secondary | ICD-10-CM

## 2014-05-18 DIAGNOSIS — I2510 Atherosclerotic heart disease of native coronary artery without angina pectoris: Secondary | ICD-10-CM | POA: Diagnosis not present

## 2014-05-18 DIAGNOSIS — M899 Disorder of bone, unspecified: Secondary | ICD-10-CM | POA: Diagnosis not present

## 2014-05-18 DIAGNOSIS — M19019 Primary osteoarthritis, unspecified shoulder: Secondary | ICD-10-CM | POA: Diagnosis not present

## 2014-05-18 DIAGNOSIS — M47812 Spondylosis without myelopathy or radiculopathy, cervical region: Secondary | ICD-10-CM | POA: Diagnosis not present

## 2014-05-18 NOTE — Progress Notes (Signed)
Pre visit review using our clinic review tool, if applicable. No additional management support is needed unless otherwise documented below in the visit note. 

## 2014-05-18 NOTE — Progress Notes (Signed)
OFFICE NOTE  05/18/2014  CC:  Chief Complaint  Patient presents with  . Neck Pain  . Shoulder Pain   HPI: Patient is a 80 y.o. Caucasian male who is here for neck and shoulder pain.   Hurts in left side of neck, across top of left shoulder, onset 6 days ago while riding in car to the beach. He had just gotten over his cough from bronchitis.  He did not have to take the levaquin I had called in.  No different bed prior.  Capsaicin and heat being applied.  No tingling or numbness in left arm.  No arm weakness. Tramadol helped him sleep last night.  Has taken ibup 800 mg qd chronically but last day or two has taken it bid. He is very worried about this pain being a recurrence of melanoma in this area.  Pertinent PMH:  Past medical, surgical, social, and family history reviewed and no changes are noted since last office visit.  MEDS:  Outpatient Prescriptions Prior to Visit  Medication Sig Dispense Refill  . Ascorbic Acid (VITAMIN C) 1000 MG tablet Take 1,000 mg by mouth daily.      Marland Kitchen aspirin 325 MG tablet Take 325 mg by mouth daily.        Marland Kitchen docusate sodium (COLACE) 50 MG capsule Take by mouth 2 (two) times daily.      Marland Kitchen ibuprofen (ADVIL,MOTRIN) 800 MG tablet Take 1 tablet (800 mg total) by mouth every 8 (eight) hours as needed for pain.  270 tablet  1  . metoprolol (LOPRESSOR) 50 MG tablet TAKE ONE TABLET BY MOUTH TWICE DAILY  180 tablet  1  . Multiple Vitamin (MULTIVITAMIN) tablet Take 1 tablet by mouth daily.        . rosuvastatin (CRESTOR) 5 MG tablet Take 1/2 tab daily  30 tablet  12  . levofloxacin (LEVAQUIN) 500 MG tablet Take 1 tablet (500 mg total) by mouth daily.  10 tablet  0   No facility-administered medications prior to visit.    PE: Blood pressure 124/77, pulse 56, temperature 97.8 F (36.6 C), temperature source Temporal, height 5\' 9"  (1.753 m), weight 223 lb (101.152 kg), SpO2 93.00%. Gen: Alert, well appearing.  Patient is oriented to person, place, time, and  situation. CV: RRR, no m/r/g.   LUNGS: CTA bilat, nonlabored resps, good aeration in all lung fields. SKIN: many benign appearing seb keratoses lesions and pigmented macules on shoulders and back. No new lesions per wife (he gets REGULAR derm f/u skin checks). NECK: flexion intact but very limited in extension, lateral bending, and rotation due to left sided neck pain and pulling sensation/pain in left scapula region when he tries these movement.  No palpable area of tenderness in soft tissues of neck, upper back, shoulders, or scapulae.  IMPRESSION AND PLAN:  Left cervical soft tissue strain, deep soft tissue strain: I feel this is secondary to all the coughing he has done recently (now resolved). He is VERY worried that this is possibly a recurrence of his melanoma since it is in the same location as his melanoma decades ago, and basically the only way I could reassure him was to get an x-ray of his scapula today. Given his chronic neck stiffness and intermittent neck arthritis pain I'll get baseline neck plain film today.  Recommended short term (7d) ibuprofen 800 mg bid with food. His wife has tramadol that he can take prn pain.  Therapeutic expectations and side effect profile of medication discussed today.  Patient's questions answered.  Of note, this is the type of unexplained pain that sometimes turns out to be shingles, so we'll keep our eyes/ears open for report of rash in the upcoming days.  An After Visit Summary was printed and given to the patient.  Spent 25 min with pt today, with >50% of this time spent in counseling and care coordination regarding the above problems.  FOLLOW UP: prn

## 2014-05-18 NOTE — Patient Instructions (Signed)
Ibuprofen 800 mg twice a day WITH FOOD for 1 week.  Take tramadol 50mg  (start with 1/2 -1 tab, max dosing is 2 tabs at one time).  May take this three times per day as needed for pain.

## 2014-06-02 DIAGNOSIS — Z85820 Personal history of malignant melanoma of skin: Secondary | ICD-10-CM | POA: Diagnosis not present

## 2014-06-02 DIAGNOSIS — Z880 Allergy status to penicillin: Secondary | ICD-10-CM | POA: Diagnosis not present

## 2014-06-02 DIAGNOSIS — Z9889 Other specified postprocedural states: Secondary | ICD-10-CM | POA: Diagnosis not present

## 2014-06-02 DIAGNOSIS — I10 Essential (primary) hypertension: Secondary | ICD-10-CM | POA: Diagnosis not present

## 2014-06-02 DIAGNOSIS — H25011 Cortical age-related cataract, right eye: Secondary | ICD-10-CM | POA: Diagnosis not present

## 2014-06-02 DIAGNOSIS — I509 Heart failure, unspecified: Secondary | ICD-10-CM | POA: Diagnosis not present

## 2014-06-02 DIAGNOSIS — K219 Gastro-esophageal reflux disease without esophagitis: Secondary | ICD-10-CM | POA: Diagnosis not present

## 2014-06-02 DIAGNOSIS — Z7982 Long term (current) use of aspirin: Secondary | ICD-10-CM | POA: Diagnosis not present

## 2014-06-02 DIAGNOSIS — M1990 Unspecified osteoarthritis, unspecified site: Secondary | ICD-10-CM | POA: Diagnosis not present

## 2014-06-02 DIAGNOSIS — Z79899 Other long term (current) drug therapy: Secondary | ICD-10-CM | POA: Diagnosis not present

## 2014-06-02 DIAGNOSIS — I2510 Atherosclerotic heart disease of native coronary artery without angina pectoris: Secondary | ICD-10-CM | POA: Diagnosis not present

## 2014-06-02 DIAGNOSIS — G459 Transient cerebral ischemic attack, unspecified: Secondary | ICD-10-CM | POA: Diagnosis not present

## 2014-06-02 DIAGNOSIS — H2511 Age-related nuclear cataract, right eye: Secondary | ICD-10-CM | POA: Diagnosis not present

## 2014-06-02 DIAGNOSIS — Z882 Allergy status to sulfonamides status: Secondary | ICD-10-CM | POA: Diagnosis not present

## 2014-06-02 DIAGNOSIS — Z791 Long term (current) use of non-steroidal anti-inflammatories (NSAID): Secondary | ICD-10-CM | POA: Diagnosis not present

## 2014-06-02 DIAGNOSIS — H1859 Other hereditary corneal dystrophies: Secondary | ICD-10-CM | POA: Diagnosis not present

## 2014-06-02 DIAGNOSIS — Z888 Allergy status to other drugs, medicaments and biological substances status: Secondary | ICD-10-CM | POA: Diagnosis not present

## 2014-06-02 DIAGNOSIS — Z955 Presence of coronary angioplasty implant and graft: Secondary | ICD-10-CM | POA: Diagnosis not present

## 2014-06-02 DIAGNOSIS — H1851 Endothelial corneal dystrophy: Secondary | ICD-10-CM | POA: Diagnosis not present

## 2014-07-26 DIAGNOSIS — H02055 Trichiasis without entropian left lower eyelid: Secondary | ICD-10-CM | POA: Diagnosis not present

## 2014-08-03 DIAGNOSIS — D485 Neoplasm of uncertain behavior of skin: Secondary | ICD-10-CM | POA: Diagnosis not present

## 2014-08-03 DIAGNOSIS — L570 Actinic keratosis: Secondary | ICD-10-CM | POA: Diagnosis not present

## 2014-08-03 DIAGNOSIS — L820 Inflamed seborrheic keratosis: Secondary | ICD-10-CM | POA: Diagnosis not present

## 2014-08-04 ENCOUNTER — Ambulatory Visit (INDEPENDENT_AMBULATORY_CARE_PROVIDER_SITE_OTHER): Payer: Medicare Other | Admitting: Family Medicine

## 2014-08-04 ENCOUNTER — Encounter: Payer: Self-pay | Admitting: Family Medicine

## 2014-08-04 VITALS — BP 123/69 | HR 58 | Temp 98.2°F | Resp 16 | Ht 69.0 in | Wt 229.0 lb

## 2014-08-04 DIAGNOSIS — C44629 Squamous cell carcinoma of skin of left upper limb, including shoulder: Secondary | ICD-10-CM | POA: Diagnosis not present

## 2014-08-04 DIAGNOSIS — I2510 Atherosclerotic heart disease of native coronary artery without angina pectoris: Secondary | ICD-10-CM | POA: Diagnosis not present

## 2014-08-04 DIAGNOSIS — J069 Acute upper respiratory infection, unspecified: Secondary | ICD-10-CM

## 2014-08-04 NOTE — Progress Notes (Signed)
OFFICE NOTE  08/04/2014  CC:  Chief Complaint  Patient presents with  . URI    x 2 - 3 days      HPI: Patient is a 80 y.o. Caucasian male who is here for 3-4 daysIllness: started with sore ST x 1d but this is gone now, now feels tickle in throat, runny nose (clear), PND, coughing.  No SOB or chest tightness or wheezing.  No fever.  No achiness.  Feels fatigued/washed out somewhat last few days.  No HA, no face pain, no upper teeth pain.  Pertinent PMH:  Past medical, surgical, social, and family history reviewed and no changes are noted since last office visit.  MEDS:  Outpatient Prescriptions Prior to Visit  Medication Sig Dispense Refill  . aspirin 325 MG tablet Take 325 mg by mouth daily.      Marland Kitchen docusate sodium (COLACE) 50 MG capsule Take by mouth 2 (two) times daily.    Marland Kitchen ibuprofen (ADVIL,MOTRIN) 800 MG tablet Take 1 tablet (800 mg total) by mouth every 8 (eight) hours as needed for pain. 270 tablet 1  . metoprolol (LOPRESSOR) 50 MG tablet TAKE ONE TABLET BY MOUTH TWICE DAILY 180 tablet 1  . Multiple Vitamin (MULTIVITAMIN) tablet Take 1 tablet by mouth daily.      . rosuvastatin (CRESTOR) 5 MG tablet Take 1/2 tab daily 30 tablet 12  . Ascorbic Acid (VITAMIN C) 1000 MG tablet Take 1,000 mg by mouth daily.     No facility-administered medications prior to visit.    PE: Blood pressure 123/69, pulse 58, temperature 98.2 F (36.8 C), temperature source Temporal, resp. rate 16, height 5\' 9"  (1.753 m), weight 229 lb (103.874 kg), SpO2 94 %. VS: noted--normal. Gen: alert, NAD, NONTOXIC APPEARING. HEENT: eyes without injection, drainage, or swelling.  Ears: EACs clear, TMs with normal light reflex and landmarks.  Nose: Clear rhinorrhea, with some dried, crusty exudate adherent to mildly injected mucosa.  No purulent d/c.  No paranasal sinus TTP.  No facial swelling.  Throat and mouth without focal lesion.  No pharyngial swelling, erythema, or exudate.   Neck: supple, no LAD.    LUNGS: CTA bilat, nonlabored resps.   CV: RRR, no m/r/g. EXT: no c/c/e SKIN: no rash    IMPRESSION AND PLAN:  Viral URI, no sign of RAD/bronchitis. Reassured pt. Continue symptomatic care. Signs/symptoms to call or return for were reviewed and pt expressed understanding.   FOLLOW UP: prn

## 2014-08-04 NOTE — Progress Notes (Signed)
Pre visit review using our clinic review tool, if applicable. No additional management support is needed unless otherwise documented below in the visit note. 

## 2014-08-04 NOTE — Patient Instructions (Signed)
Continue robitussin DM as directed on packaging.

## 2014-08-05 ENCOUNTER — Encounter (HOSPITAL_COMMUNITY): Payer: Self-pay | Admitting: *Deleted

## 2014-08-05 ENCOUNTER — Emergency Department (HOSPITAL_COMMUNITY): Payer: Medicare Other

## 2014-08-05 ENCOUNTER — Emergency Department (HOSPITAL_COMMUNITY)
Admission: EM | Admit: 2014-08-05 | Discharge: 2014-08-05 | Disposition: A | Discharge status: Home / Self Care | Payer: Medicare Other | Source: Home / Self Care | Attending: Emergency Medicine | Admitting: Emergency Medicine

## 2014-08-05 DIAGNOSIS — Z7982 Long term (current) use of aspirin: Secondary | ICD-10-CM

## 2014-08-05 DIAGNOSIS — I252 Old myocardial infarction: Secondary | ICD-10-CM

## 2014-08-05 DIAGNOSIS — N200 Calculus of kidney: Secondary | ICD-10-CM | POA: Diagnosis not present

## 2014-08-05 DIAGNOSIS — Z880 Allergy status to penicillin: Secondary | ICD-10-CM | POA: Insufficient documentation

## 2014-08-05 DIAGNOSIS — Z86718 Personal history of other venous thrombosis and embolism: Secondary | ICD-10-CM

## 2014-08-05 DIAGNOSIS — M1612 Unilateral primary osteoarthritis, left hip: Secondary | ICD-10-CM

## 2014-08-05 DIAGNOSIS — M1611 Unilateral primary osteoarthritis, right hip: Secondary | ICD-10-CM | POA: Insufficient documentation

## 2014-08-05 DIAGNOSIS — H2513 Age-related nuclear cataract, bilateral: Secondary | ICD-10-CM | POA: Insufficient documentation

## 2014-08-05 DIAGNOSIS — Z79899 Other long term (current) drug therapy: Secondary | ICD-10-CM | POA: Insufficient documentation

## 2014-08-05 DIAGNOSIS — I2510 Atherosclerotic heart disease of native coronary artery without angina pectoris: Secondary | ICD-10-CM | POA: Insufficient documentation

## 2014-08-05 DIAGNOSIS — R109 Unspecified abdominal pain: Secondary | ICD-10-CM

## 2014-08-05 DIAGNOSIS — Z951 Presence of aortocoronary bypass graft: Secondary | ICD-10-CM | POA: Insufficient documentation

## 2014-08-05 DIAGNOSIS — Z8673 Personal history of transient ischemic attack (TIA), and cerebral infarction without residual deficits: Secondary | ICD-10-CM

## 2014-08-05 DIAGNOSIS — E785 Hyperlipidemia, unspecified: Secondary | ICD-10-CM

## 2014-08-05 DIAGNOSIS — Z85820 Personal history of malignant melanoma of skin: Secondary | ICD-10-CM

## 2014-08-05 DIAGNOSIS — N202 Calculus of kidney with calculus of ureter: Secondary | ICD-10-CM | POA: Diagnosis not present

## 2014-08-05 DIAGNOSIS — I10 Essential (primary) hypertension: Secondary | ICD-10-CM

## 2014-08-05 DIAGNOSIS — N201 Calculus of ureter: Secondary | ICD-10-CM | POA: Insufficient documentation

## 2014-08-05 LAB — URINALYSIS, ROUTINE W REFLEX MICROSCOPIC
Bilirubin Urine: NEGATIVE
GLUCOSE, UA: NEGATIVE mg/dL
Ketones, ur: NEGATIVE mg/dL
Leukocytes, UA: NEGATIVE
Nitrite: NEGATIVE
PROTEIN: NEGATIVE mg/dL
Specific Gravity, Urine: 1.021 (ref 1.005–1.030)
Urobilinogen, UA: 1 mg/dL (ref 0.0–1.0)
pH: 5.5 (ref 5.0–8.0)

## 2014-08-05 LAB — COMPREHENSIVE METABOLIC PANEL
ALT: 43 U/L (ref 0–53)
AST: 36 U/L (ref 0–37)
Albumin: 3.7 g/dL (ref 3.5–5.2)
Alkaline Phosphatase: 71 U/L (ref 39–117)
Anion gap: 12 (ref 5–15)
BUN: 14 mg/dL (ref 6–23)
CHLORIDE: 102 meq/L (ref 96–112)
CO2: 25 meq/L (ref 19–32)
CREATININE: 0.75 mg/dL (ref 0.50–1.35)
Calcium: 9.6 mg/dL (ref 8.4–10.5)
GFR calc Af Amer: >90 mL/min (ref >90)
GFR, EST NON AFRICAN AMERICAN: 84 mL/min — AB (ref >90)
Glucose, Bld: 105 mg/dL — ABNORMAL HIGH (ref 70–99)
Potassium: 4.3 mEq/L (ref 3.7–5.3)
Sodium: 139 mEq/L (ref 137–147)
Total Bilirubin: 0.4 mg/dL (ref 0.3–1.2)
Total Protein: 6.7 g/dL (ref 6.0–8.3)

## 2014-08-05 LAB — CBC WITH DIFFERENTIAL/PLATELET
BASOS ABS: 0 10*3/uL (ref 0.0–0.1)
Basophils Relative: 0 % (ref 0–1)
Eosinophils Absolute: 0.2 10*3/uL (ref 0.0–0.7)
Eosinophils Relative: 2 % (ref 0–5)
HEMATOCRIT: 43.5 % (ref 39.0–52.0)
HEMOGLOBIN: 14.7 g/dL (ref 13.0–17.0)
LYMPHS ABS: 2.1 10*3/uL (ref 0.7–4.0)
LYMPHS PCT: 27 % (ref 12–46)
MCH: 33.6 pg (ref 26.0–34.0)
MCHC: 33.8 g/dL (ref 30.0–36.0)
MCV: 99.5 fL (ref 78.0–100.0)
MONO ABS: 0.8 10*3/uL (ref 0.1–1.0)
MONOS PCT: 10 % (ref 3–12)
NEUTROS ABS: 4.5 10*3/uL (ref 1.7–7.7)
Neutrophils Relative %: 61 % (ref 43–77)
Platelets: 163 10*3/uL (ref 150–400)
RBC: 4.37 MIL/uL (ref 4.22–5.81)
RDW: 13.2 % (ref 11.5–15.5)
WBC: 7.6 10*3/uL (ref 4.0–10.5)

## 2014-08-05 LAB — URINE MICROSCOPIC-ADD ON

## 2014-08-05 LAB — TROPONIN I: Troponin I: <0.3 ng/mL (ref <0.30)

## 2014-08-05 MED ORDER — OXYCODONE-ACETAMINOPHEN 5-325 MG PO TABS
1.0000 | ORAL_TABLET | Freq: Once | ORAL | Status: AC
  Administered 2014-08-05: 1 via ORAL
  Filled 2014-08-05: qty 1

## 2014-08-05 MED ORDER — ONDANSETRON 8 MG PO TBDP: 8.0000 mg | ORAL_TABLET | Freq: Three times a day (TID) | ORAL | Status: DC | PRN

## 2014-08-05 MED ORDER — MORPHINE SULFATE 4 MG/ML IJ SOLN: 6.0000 mg | Freq: Once | INTRAMUSCULAR | Status: DC

## 2014-08-05 MED ORDER — OXYCODONE-ACETAMINOPHEN 5-325 MG PO TABS: 1.0000 | ORAL_TABLET | ORAL | Status: DC | PRN

## 2014-08-05 NOTE — ED Notes (Signed)
Pt reports onset this am 0700 of right side lower abd pain that radiates around to his side and back. Hx of kidney stones and this feels similar. Denies difficulty urinating this morning, is having htn and nausea this am. bp 186/77 at triage.

## 2014-08-05 NOTE — ED Notes (Signed)
MD at bedside. 

## 2014-08-05 NOTE — Discharge Instructions (Signed)
Ureteral Colic (Kidney Stones) °Ureteral colic is the result of a condition when kidney stones form inside the kidney. Once kidney stones are formed they may move into the tube that connects the kidney with the bladder (ureter). If this occurs, this condition may cause pain (colic) in the ureter.  °CAUSES  °Pain is caused by stone movement in the ureter and the obstruction caused by the stone. °SYMPTOMS  °The pain comes and goes as the ureter contracts around the stone. The pain is usually intense, sharp, and stabbing in character. The location of the pain may move as the stone moves through the ureter. When the stone is near the kidney the pain is usually located in the back and radiates to the belly (abdomen). When the stone is ready to pass into the bladder the pain is often located in the lower abdomen on the side the stone is located. At this location, the symptoms may mimic those of a urinary tract infection with urinary frequency. Once the stone is located here it often passes into the bladder and the pain disappears completely. °TREATMENT  °· Your caregiver will provide you with medicine for pain relief. °· You may require specialized follow-up X-rays. °· The absence of pain does not always mean that the stone has passed. It may have just stopped moving. If the urine remains completely obstructed, it can cause loss of kidney function or even complete destruction of the involved kidney. It is your responsibility and in your interest that X-rays and follow-ups as suggested by your caregiver are completed. Relief of pain without passage of the stone can be associated with severe damage to the kidney, including loss of kidney function on that side. °· If your stone does not pass on its own, additional measures may be taken by your caregiver to ensure its removal. °HOME CARE INSTRUCTIONS  °· Increase your fluid intake. Water is the preferred fluid since juices containing vitamin C may acidify the urine making it  less likely for certain stones (uric acid stones) to pass. °· Strain all urine. A strainer will be provided. Keep all particulate matter or stones for your caregiver to inspect. °· Take your pain medicine as directed. °· Make a follow-up appointment with your caregiver as directed. °· Remember that the goal is passage of your stone. The absence of pain does not mean the stone is gone. Follow your caregiver's instructions. °· Only take over-the-counter or prescription medicines for pain, discomfort, or fever as directed by your caregiver. °SEEK MEDICAL CARE IF:  °· Pain cannot be controlled with the prescribed medicine. °· You have a fever. °· Pain continues for longer than your caregiver advises it should. °· There is a change in the pain, and you develop chest discomfort or constant abdominal pain. °· You feel faint or pass out. °MAKE SURE YOU:  °· Understand these instructions. °· Will watch your condition. °· Will get help right away if you are not doing well or get worse. °Document Released: 05/15/2005 Document Revised: 11/30/2012 Document Reviewed: 01/30/2011 °Exit[employer]® Patient Information ©2015 [employer]. This information is not intended to replace advice given to you by your health care provider. Make sure you discuss any questions you have with your health care provider. ° °

## 2014-08-05 NOTE — ED Notes (Signed)
Patient transported to CT 

## 2014-08-05 NOTE — ED Provider Notes (Addendum)
CSN: [id number]     Arrival date & time 08/05/14  1142 History   First MD Initiated Contact with Patient 08/05/14 1151     Chief Complaint  Patient presents with  . Abdominal Pain  . Back Pain      The history is provided by the patient.   He states he developed acute onset right-sided flank pain with radiation to her right abdomen which began this morning.  He had nausea without vomiting.  He also checked his blood pressure that time and found himself to be hypertensive.  He had transient discomfort in his neck and anterior chest that has completely resolved.  He does have a history of MI and therefore he is also concerned about the possibility of an issue with his heart.  He states his right flank and right-sided abdominal pain is improving.  No lightheadedness.  Denies palpitations.  No recent injury or trauma.  No active chest pain or neck pain at this time.  When asked if he wanted something for pain he stated no at this time.   Past Medical History  Diagnosis Date  . DVT (deep venous thrombosis) 2009    Post-op from laparoscopic inguinal hernia repair  . CAD (coronary artery disease)     CABG 2008:  myoview  2010 showed no ischemia and a normal EF  . Nephrolithiasis   . Hyperlipidemia   . Hypertension   . Prostate nodule     multiple benign biopsies (Dr Rosana Hoes)  . MI (myocardial infarction) 10/14/2006    Inferior.    . Osteoarthritis     Primarily hips  . Melanoma     skin graft surgery required  . Groin pain, left lower quadrant     Chronic, intermittent: believed to be due to scarring around MESH placed at inguinal hernia repair  . CVA (cerebral vascular accident)     possible/no residual deficit--following CABG   . Retroperitoneal hematoma 2009    on lovenox; greenfield filter placed after this  . Diverticulosis of rectosigmoid     Noted on CT 04/2011 done to eval chronic groin pain  . MELANOMA, HX OF 12/02/2008  . Nuclear sclerotic cataract of both eyes 2015   Past  Surgical History  Procedure Laterality Date  . Hip replacement bilateral      right was 2004, left 2011  . Rotator cuff repair    . Status post coronary bypass graft  10/14/06     (LIMA to the LAD, saphenous vein graft to the diagonal, saphenous vein graft to the mid obtuse marginal, saphenous vein graft placed in a sequential fashion ot the RV branch of the right coronary artery and distal right coronary artery  . Carpal tunnel repair b    . Trigger finger x 3    . Kidney stone removal  01/18/88  . Melanoma excision  08/1990    left shoulder - skin graft from leg  . Hernia repair  05/20/2008    open, X 3  . Colonoscopy w/ polypectomy  02/07/09    Adenomatous polyps, severe diverticulosis, internal hemorrhoids.  Repeat 2015  . Greenfield filter     Family History  Problem Relation Age of Onset  . Cancer Mother 76    "male"  . Heart attack Father 64  . Heart disease Father   . Heart disease Brother    History  Substance Use Topics  . Smoking status: Never Smoker   . Smokeless tobacco: Never Used  . Alcohol Use:  No    Review of Systems  All other systems reviewed and are negative.     Allergies  Clindamycin/lincomycin; Penicillins; and Sulfonamide derivatives  Home Medications   Prior to Admission medications   Medication Sig Start Date End Date Taking? Authorizing Provider  aspirin 325 MG tablet Take 325 mg by mouth daily.     Yes Historical Provider, MD  diphenhydrAMINE (BENADRYL) 12.5 MG/5ML liquid Take 25 mg by mouth daily as needed for itching.   Yes Historical Provider, MD  docusate sodium (COLACE) 50 MG capsule Take by mouth 2 (two) times daily.   Yes Historical Provider, MD  guaifenesin (ROBITUSSIN) 100 MG/5ML syrup Take 200 mg by mouth 3 (three) times daily as needed for cough.   Yes Historical Provider, MD  ibuprofen (ADVIL,MOTRIN) 800 MG tablet Take 1 tablet (800 mg total) by mouth every 8 (eight) hours as needed for pain. 11/26/12  Yes Tammi Sou, MD   metoprolol (LOPRESSOR) 50 MG tablet TAKE ONE TABLET BY MOUTH TWICE DAILY   Yes Lelon Perla, MD  Multiple Vitamin (MULTIVITAMIN) tablet Take 1 tablet by mouth daily.     Yes Historical Provider, MD  Polyethyl Glycol-Propyl Glycol (SYSTANE OP) Apply 1 drop to eye 4 (four) times daily.   Yes Historical Provider, MD  rosuvastatin (CRESTOR) 5 MG tablet Take 1/2 tab daily Patient taking differently: Take 2.5 mg by mouth 2 (two) times daily.  07/30/11  Yes Lelon Perla, MD  ondansetron (ZOFRAN ODT) 8 MG disintegrating tablet Take 1 tablet (8 mg total) by mouth every 8 (eight) hours as needed for nausea or vomiting. 08/05/14   Hoy Morn, MD  oxyCODONE-acetaminophen (PERCOCET/ROXICET) 5-325 MG per tablet Take 1 tablet by mouth every 4 (four) hours as needed for severe pain. 08/05/14   Hoy Morn, MD   BP 167/82 mmHg  Pulse 53  Temp(Src) 97.7 F (36.5 C) (Oral)  Resp 17  Ht 5\' 11"  (1.803 m)  Wt 225 lb (102.059 kg)  BMI 31.39 kg/m2  SpO2 97% Physical Exam  Constitutional: He is oriented to person, place, and time. He appears well-developed and well-nourished.  Uncomfortable appearing  HENT:  Head: Normocephalic and atraumatic.  Eyes: EOM are normal.  Neck: Normal range of motion.  Cardiovascular: Normal rate, regular rhythm, normal heart sounds and intact distal pulses.   Pulmonary/Chest: Effort normal and breath sounds normal. No respiratory distress.  Abdominal: Soft. He exhibits no distension. There is no tenderness.  Musculoskeletal: Normal range of motion.  Neurological: He is alert and oriented to person, place, and time.  Skin: Skin is warm and dry.  Psychiatric: He has a normal mood and affect. Judgment normal.  Nursing note and vitals reviewed.   ED Course  Procedures (including critical care time) Labs Review Labs Reviewed  COMPREHENSIVE METABOLIC PANEL - Abnormal; Notable for the following:    Glucose, Bld 105 (*)    GFR calc non Af Amer 84 (*)    All  other components within normal limits  URINALYSIS, ROUTINE W REFLEX MICROSCOPIC - Abnormal; Notable for the following:    APPearance CLOUDY (*)    Hgb urine dipstick LARGE (*)    All other components within normal limits  CBC WITH DIFFERENTIAL  URINE MICROSCOPIC-ADD ON  TROPONIN I    Imaging Review Ct Abdomen Pelvis Wo Contrast  08/05/2014   CLINICAL DATA:  Right-sided low abdominal pain extending into the back. History of kidney stones. Difficulty urinating today.  EXAM: CT ABDOMEN AND  PELVIS WITHOUT CONTRAST  TECHNIQUE: Multidetector CT imaging of the abdomen and pelvis was performed following the standard protocol without IV contrast.  COMPARISON:  Pelvic MRI 05/05/2013.  Abdominal pelvic CT 06/14/2012.  FINDINGS: Lower chest: There is streaky atelectasis or scarring in both lung bases, unchanged from the prior study.  Hepatobiliary: Multiple hepatic cysts are grossly unchanged. There is diffuse hepatic steatosis. No new or enlarging liver lesions are identified. There is a small calcified gallstone in the gallbladder neck. There is no gallbladder wall distention, wall thickening or surrounding inflammatory change. There is no biliary dilatation.  Pancreas: Unremarkable. No pancreatic ductal dilatation or surrounding inflammatory changes.  Spleen: Normal in size without focal abnormality.  Adrenals/Urinary Tract: Both adrenal glands appear normal.There is a 2 mm nonobstructing calculus in the interpolar region of the right kidney. There is a tiny calculus in the lower pole of the left kidney. There is mild right-sided hydronephrosis and hydroureter. The right ureter is obstructed proximal to a 2 mm calculus on image 57. This is located at the L5 level. There is no left-sided hydronephrosis. The bladder is partly obscured by artifact from the bilateral total hip arthroplasties, but demonstrates no definite abnormality.  Stomach/Bowel: There are extensive diverticular changes throughout the descending  and sigmoid colon without surrounding inflammatory change. There may be mild colonic wall thickening. The stomach and small bowel demonstrate no significant findings. The appendix appears normal. There is no extraluminal fluid collection.  Vascular/Lymphatic: Small lymph nodes within the porta hepatis are not pathologically enlarged. There is no retroperitoneal or pelvic lymphadenopathy. There is diffuse atherosclerosis of the aorta, its branches and the iliac arteries. IVC filter noted.  Reproductive: Mild to moderate enlargement of the prostate gland, grossly stable.  Other: There are postsurgical changes within the anterior abdominal wall status post hernia repair. There is asymmetric fat within the right inguinal canal which appears unchanged.  Musculoskeletal: No acute or significant osseous findings. Previous bilateral total hip arthroplasty. Mild lumbar spondylosis noted.  IMPRESSION: 1. Obstructing 2 mm calculus in the mid right ureter. 2. Nonobstructing bilateral renal calculi. 3. Diffuse atherosclerosis. 4. Hepatic steatosis, hepatic cysts and cholelithiasis noted.   Electronically Signed   By: Camie Patience M.D.   On: 08/05/2014 14:43  I personally reviewed the imaging tests through PACS system I reviewed available ER/hospitalization records through the EMR    EKG Interpretation   Date/Time:  Friday August 05 2014 12:02:23 EST Ventricular Rate:  50 PR Interval:  166 QRS Duration: 97 QT Interval:  435 QTC Calculation: 397 R Axis:   -9 Text Interpretation:  Sinus rhythm No old tracing to compare Confirmed by  Anayiah Howden  MD, Lennette Bihari ([id number]) on 08/05/2014 12:04:27 PM      MDM   Final diagnoses:  Right flank pain  Right ureteral stone    3:23 PM Improved at this time.  Right ureteral stone.  Doubt ACS.  Discharge home in good condition.  Outpatient urology follow-up.  He understands to return to ER for new or worsening symptoms.    Hoy Morn, MD 08/05/14 [place], MD 09/15/14 [phone]

## 2014-08-06 ENCOUNTER — Inpatient Hospital Stay (HOSPITAL_COMMUNITY)
Admission: EM | Admit: 2014-08-06 | Discharge: 2014-08-09 | DRG: 694 | Disposition: A | Discharge status: Home / Self Care | Payer: Medicare Other | Attending: Internal Medicine | Admitting: Internal Medicine

## 2014-08-06 ENCOUNTER — Encounter (HOSPITAL_COMMUNITY): Payer: Self-pay | Admitting: Emergency Medicine

## 2014-08-06 DIAGNOSIS — Z951 Presence of aortocoronary bypass graft: Secondary | ICD-10-CM | POA: Diagnosis not present

## 2014-08-06 DIAGNOSIS — K760 Fatty (change of) liver, not elsewhere classified: Secondary | ICD-10-CM | POA: Diagnosis present

## 2014-08-06 DIAGNOSIS — E780 Pure hypercholesterolemia: Secondary | ICD-10-CM | POA: Diagnosis present

## 2014-08-06 DIAGNOSIS — K8020 Calculus of gallbladder without cholecystitis without obstruction: Secondary | ICD-10-CM | POA: Diagnosis present

## 2014-08-06 DIAGNOSIS — N179 Acute kidney failure, unspecified: Secondary | ICD-10-CM | POA: Diagnosis present

## 2014-08-06 DIAGNOSIS — Z882 Allergy status to sulfonamides status: Secondary | ICD-10-CM

## 2014-08-06 DIAGNOSIS — Z96643 Presence of artificial hip joint, bilateral: Secondary | ICD-10-CM | POA: Diagnosis present

## 2014-08-06 DIAGNOSIS — E785 Hyperlipidemia, unspecified: Secondary | ICD-10-CM | POA: Diagnosis present

## 2014-08-06 DIAGNOSIS — Z6830 Body mass index (BMI) 30.0-30.9, adult: Secondary | ICD-10-CM | POA: Diagnosis not present

## 2014-08-06 DIAGNOSIS — R109 Unspecified abdominal pain: Secondary | ICD-10-CM

## 2014-08-06 DIAGNOSIS — I2510 Atherosclerotic heart disease of native coronary artery without angina pectoris: Secondary | ICD-10-CM | POA: Diagnosis present

## 2014-08-06 DIAGNOSIS — N201 Calculus of ureter: Secondary | ICD-10-CM

## 2014-08-06 DIAGNOSIS — N202 Calculus of kidney with calculus of ureter: Secondary | ICD-10-CM | POA: Diagnosis present

## 2014-08-06 DIAGNOSIS — E669 Obesity, unspecified: Secondary | ICD-10-CM | POA: Diagnosis present

## 2014-08-06 DIAGNOSIS — Z86718 Personal history of other venous thrombosis and embolism: Secondary | ICD-10-CM | POA: Diagnosis not present

## 2014-08-06 DIAGNOSIS — Z881 Allergy status to other antibiotic agents status: Secondary | ICD-10-CM | POA: Diagnosis not present

## 2014-08-06 DIAGNOSIS — I10 Essential (primary) hypertension: Secondary | ICD-10-CM | POA: Diagnosis present

## 2014-08-06 DIAGNOSIS — E860 Dehydration: Secondary | ICD-10-CM | POA: Diagnosis present

## 2014-08-06 DIAGNOSIS — N132 Hydronephrosis with renal and ureteral calculous obstruction: Secondary | ICD-10-CM | POA: Diagnosis present

## 2014-08-06 DIAGNOSIS — R944 Abnormal results of kidney function studies: Secondary | ICD-10-CM | POA: Diagnosis not present

## 2014-08-06 DIAGNOSIS — N200 Calculus of kidney: Secondary | ICD-10-CM | POA: Diagnosis not present

## 2014-08-06 DIAGNOSIS — I252 Old myocardial infarction: Secondary | ICD-10-CM

## 2014-08-06 DIAGNOSIS — M1990 Unspecified osteoarthritis, unspecified site: Secondary | ICD-10-CM | POA: Diagnosis present

## 2014-08-06 DIAGNOSIS — Z7982 Long term (current) use of aspirin: Secondary | ICD-10-CM

## 2014-08-06 DIAGNOSIS — Z880 Allergy status to penicillin: Secondary | ICD-10-CM | POA: Diagnosis not present

## 2014-08-06 DIAGNOSIS — N23 Unspecified renal colic: Secondary | ICD-10-CM | POA: Diagnosis not present

## 2014-08-06 DIAGNOSIS — Z85820 Personal history of malignant melanoma of skin: Secondary | ICD-10-CM

## 2014-08-06 DIAGNOSIS — Z8673 Personal history of transient ischemic attack (TIA), and cerebral infarction without residual deficits: Secondary | ICD-10-CM

## 2014-08-06 LAB — URINALYSIS, ROUTINE W REFLEX MICROSCOPIC
BILIRUBIN URINE: NEGATIVE
GLUCOSE, UA: NEGATIVE mg/dL
HGB URINE DIPSTICK: NEGATIVE
Ketones, ur: 40 mg/dL — AB
Leukocytes, UA: NEGATIVE
Nitrite: NEGATIVE
Protein, ur: NEGATIVE mg/dL
SPECIFIC GRAVITY, URINE: 1.023 (ref 1.005–1.030)
UROBILINOGEN UA: 0.2 mg/dL (ref 0.0–1.0)
pH: 5.5 (ref 5.0–8.0)

## 2014-08-06 LAB — CBC WITH DIFFERENTIAL/PLATELET
Basophils Absolute: 0 10*3/uL (ref 0.0–0.1)
Basophils Relative: 0 % (ref 0–1)
Eosinophils Absolute: 0 10*3/uL (ref 0.0–0.7)
Eosinophils Relative: 0 % (ref 0–5)
HCT: 43.7 % (ref 39.0–52.0)
Hemoglobin: 15.2 g/dL (ref 13.0–17.0)
LYMPHS ABS: 1.7 10*3/uL (ref 0.7–4.0)
LYMPHS PCT: 14 % (ref 12–46)
MCH: 34.7 pg — ABNORMAL HIGH (ref 26.0–34.0)
MCHC: 34.8 g/dL (ref 30.0–36.0)
MCV: 99.8 fL (ref 78.0–100.0)
Monocytes Absolute: 0.9 10*3/uL (ref 0.1–1.0)
Monocytes Relative: 8 % (ref 3–12)
NEUTROS PCT: 78 % — AB (ref 43–77)
Neutro Abs: 9.3 10*3/uL — ABNORMAL HIGH (ref 1.7–7.7)
PLATELETS: 155 10*3/uL (ref 150–400)
RBC: 4.38 MIL/uL (ref 4.22–5.81)
RDW: 13.2 % (ref 11.5–15.5)
WBC: 11.9 10*3/uL — AB (ref 4.0–10.5)

## 2014-08-06 LAB — COMPREHENSIVE METABOLIC PANEL
ALK PHOS: 61 U/L (ref 39–117)
ALT: 33 U/L (ref 0–53)
AST: 30 U/L (ref 0–37)
Albumin: 3.7 g/dL (ref 3.5–5.2)
Anion gap: 14 (ref 5–15)
BUN: 19 mg/dL (ref 6–23)
CO2: 24 meq/L (ref 19–32)
Calcium: 9.4 mg/dL (ref 8.4–10.5)
Chloride: 96 mEq/L (ref 96–112)
Creatinine, Ser: 1.47 mg/dL — ABNORMAL HIGH (ref 0.50–1.35)
GFR calc non Af Amer: 43 mL/min — ABNORMAL LOW (ref >90)
GFR, EST AFRICAN AMERICAN: 50 mL/min — AB (ref >90)
GLUCOSE: 122 mg/dL — AB (ref 70–99)
Potassium: 4.3 mEq/L (ref 3.7–5.3)
SODIUM: 134 meq/L — AB (ref 137–147)
Total Bilirubin: 0.5 mg/dL (ref 0.3–1.2)
Total Protein: 7 g/dL (ref 6.0–8.3)

## 2014-08-06 MED ORDER — ONDANSETRON HCL 4 MG/2ML IJ SOLN
4.0000 mg | Freq: Once | INTRAMUSCULAR | Status: DC
  Filled 2014-08-06: qty 2

## 2014-08-06 MED ORDER — HYDROMORPHONE HCL 1 MG/ML IJ SOLN: 0.5000 mg | Freq: Once | INTRAMUSCULAR | Status: DC

## 2014-08-06 MED ORDER — FENTANYL CITRATE 0.05 MG/ML IJ SOLN
50.0000 ug | Freq: Once | INTRAMUSCULAR | Status: DC
  Filled 2014-08-06: qty 2

## 2014-08-06 MED ORDER — KETOROLAC TROMETHAMINE 30 MG/ML IJ SOLN
30.0000 mg | Freq: Once | INTRAMUSCULAR | Status: AC
  Administered 2014-08-06: 30 mg via INTRAVENOUS
  Filled 2014-08-06: qty 1

## 2014-08-06 MED ORDER — SODIUM CHLORIDE 0.9 % IV BOLUS (SEPSIS)
1000.0000 mL | INTRAVENOUS | Status: AC
  Administered 2014-08-06: 1000 mL via INTRAVENOUS

## 2014-08-06 MED ORDER — HYDROMORPHONE HCL 1 MG/ML IJ SOLN
0.5000 mg | Freq: Once | INTRAMUSCULAR | Status: AC
  Administered 2014-08-06: 0.5 mg via INTRAVENOUS
  Filled 2014-08-06: qty 1

## 2014-08-06 MED ORDER — HYDROMORPHONE HCL 1 MG/ML IJ SOLN
1.0000 mg | Freq: Once | INTRAMUSCULAR | Status: AC
  Administered 2014-08-07: 1 mg via INTRAVENOUS
  Filled 2014-08-06: qty 1

## 2014-08-06 NOTE — ED Provider Notes (Signed)
CSN: [id number]     Arrival date & time 08/06/14  2137 History   First MD Initiated Contact with Patient 08/06/14 2142     Chief Complaint  Patient presents with  . Nephrolithiasis  . Abdominal Pain     (Consider location/radiation/quality/duration/timing/severity/associated sxs/prior Treatment) Patient is a 80 y.o. male presenting with abdominal pain. The history is provided by the patient.  Abdominal Pain Pain location:  R flank Pain quality: aching and sharp   Pain radiates to:  Groin Pain severity:  Moderate Onset quality:  Sudden Duration:  2 hours Timing:  Constant Progression:  Unchanged Chronicity:  Recurrent Context comment:  Kidney stone Relieved by:  Nothing Worsened by:  Nothing tried Ineffective treatments: percocet. Associated symptoms: no chest pain, no cough, no diarrhea, no dysuria, no fever, no hematuria, no nausea, no shortness of breath and no vomiting     Past Medical History  Diagnosis Date  . DVT (deep venous thrombosis) 2009    Post-op from laparoscopic inguinal hernia repair  . CAD (coronary artery disease)     CABG 2008:  myoview  2010 showed no ischemia and a normal EF  . Nephrolithiasis   . Hyperlipidemia   . Hypertension   . Prostate nodule     multiple benign biopsies (Dr Rosana Hoes)  . MI (myocardial infarction) 10/14/2006    Inferior.    . Osteoarthritis     Primarily hips  . Melanoma     skin graft surgery required  . Groin pain, left lower quadrant     Chronic, intermittent: believed to be due to scarring around MESH placed at inguinal hernia repair  . CVA (cerebral vascular accident)     possible/no residual deficit--following CABG   . Retroperitoneal hematoma 2009    on lovenox; greenfield filter placed after this  . Diverticulosis of rectosigmoid     Noted on CT 04/2011 done to eval chronic groin pain  . MELANOMA, HX OF 12/02/2008  . Nuclear sclerotic cataract of both eyes 2015   Past Surgical History  Procedure Laterality Date   . Hip replacement bilateral      right was 2004, left 2011  . Rotator cuff repair    . Status post coronary bypass graft  10/14/06     (LIMA to the LAD, saphenous vein graft to the diagonal, saphenous vein graft to the mid obtuse marginal, saphenous vein graft placed in a sequential fashion ot the RV branch of the right coronary artery and distal right coronary artery  . Carpal tunnel repair b    . Trigger finger x 3    . Kidney stone removal  01/18/88  . Melanoma excision  08/1990    left shoulder - skin graft from leg  . Hernia repair  05/20/2008    open, X 3  . Colonoscopy w/ polypectomy  02/07/09    Adenomatous polyps, severe diverticulosis, internal hemorrhoids.  Repeat 2015  . Greenfield filter     Family History  Problem Relation Age of Onset  . Cancer Mother 69    "male"  . Heart attack Father 71  . Heart disease Father   . Heart disease Brother    History  Substance Use Topics  . Smoking status: Never Smoker   . Smokeless tobacco: Never Used  . Alcohol Use: No    Review of Systems  Constitutional: Negative for fever.  HENT: Negative for drooling and rhinorrhea.   Eyes: Negative for pain.  Respiratory: Negative for cough and shortness of breath.  Cardiovascular: Negative for chest pain and leg swelling.  Gastrointestinal: Positive for abdominal pain. Negative for nausea, vomiting and diarrhea.  Genitourinary: Negative for dysuria and hematuria.  Musculoskeletal: Negative for gait problem and neck pain.  Skin: Negative for color change.  Neurological: Negative for numbness and headaches.  Hematological: Negative for adenopathy.  Psychiatric/Behavioral: Negative for behavioral problems.  All other systems reviewed and are negative.     Allergies  Clindamycin/lincomycin; Penicillins; and Sulfonamide derivatives  Home Medications   Prior to Admission medications   Medication Sig Start Date End Date Taking? Authorizing Provider  aspirin 325 MG tablet Take 325  mg by mouth daily.      Historical Provider, MD  diphenhydrAMINE (BENADRYL) 12.5 MG/5ML liquid Take 25 mg by mouth daily as needed for itching.    Historical Provider, MD  docusate sodium (COLACE) 50 MG capsule Take by mouth 2 (two) times daily.    Historical Provider, MD  guaifenesin (ROBITUSSIN) 100 MG/5ML syrup Take 200 mg by mouth 3 (three) times daily as needed for cough.    Historical Provider, MD  ibuprofen (ADVIL,MOTRIN) 800 MG tablet Take 1 tablet (800 mg total) by mouth every 8 (eight) hours as needed for pain. 11/26/12   Tammi Sou, MD  metoprolol (LOPRESSOR) 50 MG tablet TAKE ONE TABLET BY MOUTH TWICE DAILY    Lelon Perla, MD  Multiple Vitamin (MULTIVITAMIN) tablet Take 1 tablet by mouth daily.      Historical Provider, MD  ondansetron (ZOFRAN ODT) 8 MG disintegrating tablet Take 1 tablet (8 mg total) by mouth every 8 (eight) hours as needed for nausea or vomiting. 08/05/14   Hoy Morn, MD  oxyCODONE-acetaminophen (PERCOCET/ROXICET) 5-325 MG per tablet Take 1 tablet by mouth every 4 (four) hours as needed for severe pain. 08/05/14   Hoy Morn, MD  Polyethyl Glycol-Propyl Glycol (SYSTANE OP) Apply 1 drop to eye 4 (four) times daily.    Historical Provider, MD  rosuvastatin (CRESTOR) 5 MG tablet Take 1/2 tab daily Patient taking differently: Take 2.5 mg by mouth 2 (two) times daily.  07/30/11   Lelon Perla, MD   BP 212/79 mmHg  Temp(Src) 98.2 F (36.8 C) (Oral)  Resp 19  SpO2 94% Physical Exam  Constitutional: He is oriented to person, place, and time. He appears well-developed and well-nourished.  HENT:  Head: Normocephalic and atraumatic.  Right Ear: External ear normal.  Left Ear: External ear normal.  Nose: Nose normal.  Mouth/Throat: Oropharynx is clear and moist. No oropharyngeal exudate.  Eyes: Conjunctivae and EOM are normal. Pupils are equal, round, and reactive to light.  Neck: Normal range of motion. Neck supple.  Cardiovascular: Normal rate,  regular rhythm, normal heart sounds and intact distal pulses.  Exam reveals no gallop and no friction rub.   No murmur heard. Pulmonary/Chest: Effort normal and breath sounds normal. No respiratory distress. He has no wheezes.  Abdominal: Soft. Bowel sounds are normal. He exhibits no distension. There is no tenderness. There is no rebound and no guarding.  Musculoskeletal: Normal range of motion. He exhibits no edema or tenderness.  Neurological: He is alert and oriented to person, place, and time.  Skin: Skin is warm and dry.  Psychiatric: He has a normal mood and affect. His behavior is normal.  Nursing note and vitals reviewed.   ED Course  Procedures (including critical care time) Labs Review Labs Reviewed  CBC WITH DIFFERENTIAL - Abnormal; Notable for the following:    WBC 11.9 (*)  MCH 34.7 (*)    Neutrophils Relative % 78 (*)    Neutro Abs 9.3 (*)    All other components within normal limits  COMPREHENSIVE METABOLIC PANEL - Abnormal; Notable for the following:    Sodium 134 (*)    Glucose, Bld 122 (*)    Creatinine, Ser 1.47 (*)    GFR calc non Af Amer 43 (*)    GFR calc Af Amer 50 (*)    All other components within normal limits  URINALYSIS, ROUTINE W REFLEX MICROSCOPIC - Abnormal; Notable for the following:    Ketones, ur 40 (*)    All other components within normal limits  BASIC METABOLIC PANEL - Abnormal; Notable for the following:    Sodium 134 (*)    Glucose, Bld 104 (*)    Creatinine, Ser 1.43 (*)    GFR calc non Af Amer 44 (*)    GFR calc Af Amer 51 (*)    All other components within normal limits  CBC - Abnormal; Notable for the following:    RBC 4.08 (*)    MCV 100.7 (*)    MCH 34.1 (*)    Platelets 140 (*)    All other components within normal limits    Imaging Review Ct Abdomen Pelvis Wo Contrast  08/05/2014   CLINICAL DATA:  Right-sided low abdominal pain extending into the back. History of kidney stones. Difficulty urinating today.  EXAM: CT  ABDOMEN AND PELVIS WITHOUT CONTRAST  TECHNIQUE: Multidetector CT imaging of the abdomen and pelvis was performed following the standard protocol without IV contrast.  COMPARISON:  Pelvic MRI 05/05/2013.  Abdominal pelvic CT 06/14/2012.  FINDINGS: Lower chest: There is streaky atelectasis or scarring in both lung bases, unchanged from the prior study.  Hepatobiliary: Multiple hepatic cysts are grossly unchanged. There is diffuse hepatic steatosis. No new or enlarging liver lesions are identified. There is a small calcified gallstone in the gallbladder neck. There is no gallbladder wall distention, wall thickening or surrounding inflammatory change. There is no biliary dilatation.  Pancreas: Unremarkable. No pancreatic ductal dilatation or surrounding inflammatory changes.  Spleen: Normal in size without focal abnormality.  Adrenals/Urinary Tract: Both adrenal glands appear normal.There is a 2 mm nonobstructing calculus in the interpolar region of the right kidney. There is a tiny calculus in the lower pole of the left kidney. There is mild right-sided hydronephrosis and hydroureter. The right ureter is obstructed proximal to a 2 mm calculus on image 57. This is located at the L5 level. There is no left-sided hydronephrosis. The bladder is partly obscured by artifact from the bilateral total hip arthroplasties, but demonstrates no definite abnormality.  Stomach/Bowel: There are extensive diverticular changes throughout the descending and sigmoid colon without surrounding inflammatory change. There may be mild colonic wall thickening. The stomach and small bowel demonstrate no significant findings. The appendix appears normal. There is no extraluminal fluid collection.  Vascular/Lymphatic: Small lymph nodes within the porta hepatis are not pathologically enlarged. There is no retroperitoneal or pelvic lymphadenopathy. There is diffuse atherosclerosis of the aorta, its branches and the iliac arteries. IVC filter noted.   Reproductive: Mild to moderate enlargement of the prostate gland, grossly stable.  Other: There are postsurgical changes within the anterior abdominal wall status post hernia repair. There is asymmetric fat within the right inguinal canal which appears unchanged.  Musculoskeletal: No acute or significant osseous findings. Previous bilateral total hip arthroplasty. Mild lumbar spondylosis noted.  IMPRESSION: 1. Obstructing 2 mm calculus in the  mid right ureter. 2. Nonobstructing bilateral renal calculi. 3. Diffuse atherosclerosis. 4. Hepatic steatosis, hepatic cysts and cholelithiasis noted.   Electronically Signed   By: Camie Patience M.D.   On: 08/05/2014 14:43     EKG Interpretation None      MDM   Final diagnoses:  Renal colic    03:49 PM 80 y.o. male w hx of CAD s/p CABG, remote hx of DVT not on anti-coag (has greenfield filter), CVA, kidney stones who presents with right flank pain. The patient was seen here yesterday and found to have Obstructing 2 mm calculus in the mid right ureter. His pain was controlled and he was discharged home. He states that his pain was initially controlled today but seemed to worsen about 2 hours ago. He complains of right flank pain radiating to his right groin consistent with his kidney stone pain yesterday. He denies any fevers or vomiting. He has had some mild nausea. He notes some stuttering when urinating today but does not have the sensation to void currently on exam. He is hypertensive but his vital signs are otherwise unremarkable. Will repeat lab work and get pain control with Toradol and Dilaudid. I suspect his blood pressure will titrate down with pain control. He currently rates his pain 10 out of 10.  Discussed case w/ Dr. Matilde Sprang (URO) given bump in Cr.   Hospitalist to admit.    Pamella Pert, MD 08/07/14 1128

## 2014-08-06 NOTE — ED Notes (Signed)
Per Daughter: Patient seen at the ED yesterday for kidney stone related pain. Pt report pain is worse even with prescribed pain medicine. Pt is also having difficulty urinating, very small amounts at a time. Ax4, NAD.

## 2014-08-06 NOTE — ED Notes (Signed)
Pt from home for eval of abd pain, seen here for same and discharged home, pt states pain has not improved even with prescriptions and daughter reports decreased urine output. Pt denies any n/v/d/ or fevers. States he has been voiding more often but has decreased urine output each time. NAD.

## 2014-08-07 DIAGNOSIS — I2510 Atherosclerotic heart disease of native coronary artery without angina pectoris: Secondary | ICD-10-CM | POA: Diagnosis not present

## 2014-08-07 DIAGNOSIS — N200 Calculus of kidney: Secondary | ICD-10-CM | POA: Diagnosis not present

## 2014-08-07 DIAGNOSIS — K760 Fatty (change of) liver, not elsewhere classified: Secondary | ICD-10-CM

## 2014-08-07 DIAGNOSIS — I10 Essential (primary) hypertension: Secondary | ICD-10-CM | POA: Diagnosis not present

## 2014-08-07 DIAGNOSIS — N179 Acute kidney failure, unspecified: Secondary | ICD-10-CM | POA: Diagnosis not present

## 2014-08-07 DIAGNOSIS — E780 Pure hypercholesterolemia: Secondary | ICD-10-CM | POA: Diagnosis not present

## 2014-08-07 DIAGNOSIS — K8020 Calculus of gallbladder without cholecystitis without obstruction: Secondary | ICD-10-CM

## 2014-08-07 DIAGNOSIS — N23 Unspecified renal colic: Secondary | ICD-10-CM | POA: Diagnosis not present

## 2014-08-07 LAB — CBC
HCT: 41.1 % (ref 39.0–52.0)
Hemoglobin: 13.9 g/dL (ref 13.0–17.0)
MCH: 34.1 pg — ABNORMAL HIGH (ref 26.0–34.0)
MCHC: 33.8 g/dL (ref 30.0–36.0)
MCV: 100.7 fL — ABNORMAL HIGH (ref 78.0–100.0)
PLATELETS: 140 10*3/uL — AB (ref 150–400)
RBC: 4.08 MIL/uL — AB (ref 4.22–5.81)
RDW: 13.5 % (ref 11.5–15.5)
WBC: 8.3 10*3/uL (ref 4.0–10.5)

## 2014-08-07 LAB — BASIC METABOLIC PANEL
ANION GAP: 14 (ref 5–15)
BUN: 20 mg/dL (ref 6–23)
CALCIUM: 8.6 mg/dL (ref 8.4–10.5)
CO2: 21 mEq/L (ref 19–32)
Chloride: 99 mEq/L (ref 96–112)
Creatinine, Ser: 1.43 mg/dL — ABNORMAL HIGH (ref 0.50–1.35)
GFR, EST AFRICAN AMERICAN: 51 mL/min — AB (ref >90)
GFR, EST NON AFRICAN AMERICAN: 44 mL/min — AB (ref >90)
Glucose, Bld: 104 mg/dL — ABNORMAL HIGH (ref 70–99)
Potassium: 4 mEq/L (ref 3.7–5.3)
SODIUM: 134 meq/L — AB (ref 137–147)

## 2014-08-07 MED ORDER — SODIUM CHLORIDE 0.9 % IV SOLN
INTRAVENOUS | Status: DC
  Administered 2014-08-07: 03:00:00 via INTRAVENOUS
  Administered 2014-08-07: 1 mL via INTRAVENOUS
  Administered 2014-08-08: 23:00:00 via INTRAVENOUS
  Administered 2014-08-08: 1 mL via INTRAVENOUS

## 2014-08-07 MED ORDER — ADULT MULTIVITAMIN W/MINERALS CH
1.0000 | ORAL_TABLET | Freq: Every day | ORAL | Status: AC
  Administered 2014-08-07 – 2014-08-08 (×2): 1 via ORAL
  Filled 2014-08-07 (×2): qty 1

## 2014-08-07 MED ORDER — TAMSULOSIN HCL 0.4 MG PO CAPS
0.4000 mg | ORAL_CAPSULE | Freq: Every day | ORAL | Status: DC
  Administered 2014-08-07 – 2014-08-09 (×3): 0.4 mg via ORAL
  Filled 2014-08-07 (×3): qty 1

## 2014-08-07 MED ORDER — METOPROLOL TARTRATE 50 MG PO TABS
50.0000 mg | ORAL_TABLET | Freq: Two times a day (BID) | ORAL | Status: DC
  Filled 2014-08-07 (×3): qty 1

## 2014-08-07 MED ORDER — OXYCODONE HCL 5 MG PO TABS
5.0000 mg | ORAL_TABLET | ORAL | Status: DC | PRN
  Administered 2014-08-07 – 2014-08-08 (×5): 5 mg via ORAL
  Filled 2014-08-07 (×7): qty 1

## 2014-08-07 MED ORDER — DOCUSATE SODIUM 100 MG PO CAPS
100.0000 mg | ORAL_CAPSULE | Freq: Two times a day (BID) | ORAL | Status: DC
  Filled 2014-08-07 (×2): qty 1

## 2014-08-07 MED ORDER — HYDROMORPHONE HCL 1 MG/ML IJ SOLN
0.5000 mg | INTRAMUSCULAR | Status: DC | PRN
  Administered 2014-08-08 (×2): 1 mg via INTRAVENOUS
  Filled 2014-08-07 (×2): qty 1

## 2014-08-07 MED ORDER — DOCUSATE SODIUM 100 MG PO CAPS
100.0000 mg | ORAL_CAPSULE | Freq: Two times a day (BID) | ORAL | Status: DC
  Administered 2014-08-07 – 2014-08-09 (×5): 100 mg via ORAL
  Filled 2014-08-07 (×5): qty 1

## 2014-08-07 MED ORDER — ACETAMINOPHEN 650 MG RE SUPP: 650.0000 mg | Freq: Four times a day (QID) | RECTAL | Status: DC | PRN

## 2014-08-07 MED ORDER — ENOXAPARIN SODIUM 30 MG/0.3ML ~~LOC~~ SOLN
30.0000 mg | SUBCUTANEOUS | Status: DC
  Filled 2014-08-07 (×3): qty 0.3

## 2014-08-07 MED ORDER — HYDRALAZINE HCL 20 MG/ML IJ SOLN
10.0000 mg | Freq: Four times a day (QID) | INTRAMUSCULAR | Status: DC | PRN
  Administered 2014-08-08: 10 mg via INTRAVENOUS
  Filled 2014-08-07 (×2): qty 1

## 2014-08-07 MED ORDER — SODIUM CHLORIDE 0.9 % IV BOLUS (SEPSIS): 1000.0000 mL | INTRAVENOUS | Status: DC | PRN

## 2014-08-07 MED ORDER — ALUM & MAG HYDROXIDE-SIMETH 200-200-20 MG/5ML PO SUSP: 30.0000 mL | Freq: Four times a day (QID) | ORAL | Status: DC | PRN

## 2014-08-07 MED ORDER — HYDROMORPHONE HCL 1 MG/ML IJ SOLN: 0.5000 mg | INTRAMUSCULAR | Status: DC | PRN

## 2014-08-07 MED ORDER — METOPROLOL TARTRATE 50 MG PO TABS
50.0000 mg | ORAL_TABLET | Freq: Two times a day (BID) | ORAL | Status: DC
  Administered 2014-08-07 – 2014-08-09 (×5): 50 mg via ORAL
  Filled 2014-08-07 (×5): qty 1

## 2014-08-07 MED ORDER — ROSUVASTATIN CALCIUM 5 MG PO TABS
2.5000 mg | ORAL_TABLET | Freq: Every day | ORAL | Status: DC
  Administered 2014-08-07 – 2014-08-08 (×2): 2.5 mg via ORAL
  Filled 2014-08-07 (×3): qty 0.5

## 2014-08-07 MED ORDER — ACETAMINOPHEN 325 MG PO TABS
650.0000 mg | ORAL_TABLET | Freq: Four times a day (QID) | ORAL | Status: DC | PRN
  Filled 2014-08-07: qty 2

## 2014-08-07 MED ORDER — ONDANSETRON HCL 4 MG/2ML IJ SOLN
4.0000 mg | Freq: Four times a day (QID) | INTRAMUSCULAR | Status: DC | PRN
Start: 2014-08-07 — End: 2014-08-09

## 2014-08-07 MED ORDER — ASPIRIN 325 MG PO TABS
325.0000 mg | ORAL_TABLET | Freq: Every day | ORAL | Status: DC
  Administered 2014-08-07 – 2014-08-09 (×3): 325 mg via ORAL
  Filled 2014-08-07 (×4): qty 1

## 2014-08-07 MED ORDER — ONDANSETRON HCL 4 MG PO TABS: 4.0000 mg | ORAL_TABLET | Freq: Four times a day (QID) | ORAL | Status: DC | PRN

## 2014-08-07 NOTE — H&P (Signed)
[facility] Admission History and Physical       SAIVION GOETTEL LFY:[id number] DOB: [birth year] DOA: 08/06/2014  Referring physician: EDP  PCP: Tammi Sou, MD  Specialists:   Chief Complaint: Decreased Urine Output   HPI: Bernard Byrd is a 80 y.o. male with a history of CAD, HTN, Hyperlipidemia, Nephrolithiasis, who began to have Right sided ABD and Flank Pain x 3 days and was seen in the ED 12 days ago and diagnosed with a Uretal Stone on imaging who returns to the ED with complaints of renal colic and decreased urination over he past 24 hours and severe pain.  His creatinine had doubled over the last 24 hours from 0.7 to 1.4.    He was referred for  observation.      Review of Systems:  Constitutional: No Weight Loss, No Weight Gain, Night Sweats, Fevers, Chills, Dizziness, Fatigue, or Generalized Weakness HEENT: No Headaches, Difficulty Swallowing,Tooth/Dental Problems,Sore Throat,  No Sneezing, Rhinitis, Ear Ache, Nasal Congestion, or Post Nasal Drip,  Cardio-vascular:  No Chest pain, Orthopnea, PND, Edema in Lower Extremities, Anasarca, Dizziness, Palpitations  Resp: No Dyspnea, No DOE, No Productive Cough, No Non-Productive Cough, No Hemoptysis, No Wheezing.    GI: No Heartburn, Indigestion, Abdominal Pain, Nausea, Vomiting, Diarrhea, Hematemesis, Hematochezia, Melena, Change in Bowel Habits,  Loss of Appetite  GU: No Dysuria, Change in Color of Urine, No Urgency or Frequency, No Flank pain.  Musculoskeletal: No Joint Pain or Swelling, No Decreased Range of Motion, No Back Pain.  Neurologic: No Syncope, No Seizures, Muscle Weakness, Paresthesia, Vision Disturbance or Loss, No Diplopia, No Vertigo, No Difficulty Walking,  Skin: No Rash or Lesions. Psych: No Change in Mood or Affect, No Depression or Anxiety, No Memory loss, No Confusion, or Hallucinations   Past Medical History  Diagnosis Date  . DVT (deep venous thrombosis) 2009    Post-op from laparoscopic  inguinal hernia repair  . CAD (coronary artery disease)     CABG 2008:  myoview  2010 showed no ischemia and a normal EF  . Nephrolithiasis   . Hyperlipidemia   . Hypertension   . Prostate nodule     multiple benign biopsies (Dr Rosana Hoes)  . MI (myocardial infarction) 10/14/2006    Inferior.    . Osteoarthritis     Primarily hips  . Melanoma     skin graft surgery required  . Groin pain, left lower quadrant     Chronic, intermittent: believed to be due to scarring around MESH placed at inguinal hernia repair  . CVA (cerebral vascular accident)     possible/no residual deficit--following CABG   . Retroperitoneal hematoma 2009    on lovenox; greenfield filter placed after this  . Diverticulosis of rectosigmoid     Noted on CT 04/2011 done to eval chronic groin pain  . MELANOMA, HX OF 12/02/2008  . Nuclear sclerotic cataract of both eyes 2015      Past Surgical History  Procedure Laterality Date  . Hip replacement bilateral      right was 2004, left 2011  . Rotator cuff repair    . Status post coronary bypass graft  10/14/06     (LIMA to the LAD, saphenous vein graft to the diagonal, saphenous vein graft to the mid obtuse marginal, saphenous vein graft placed in a sequential fashion ot the RV branch of the right coronary artery and distal right coronary artery  . Carpal tunnel repair b    . Trigger finger x 3    .  Kidney stone removal  01/18/88  . Melanoma excision  08/1990    left shoulder - skin graft from leg  . Hernia repair  05/20/2008    open, X 3  . Colonoscopy w/ polypectomy  02/07/09    Adenomatous polyps, severe diverticulosis, internal hemorrhoids.  Repeat 2015  . Greenfield filter         Prior to Admission medications   Medication Sig Start Date End Date Taking? Authorizing Provider  aspirin 325 MG tablet Take 325 mg by mouth daily.     Yes Historical Provider, MD  diphenhydrAMINE (BENADRYL) 12.5 MG/5ML liquid Take 25 mg by mouth daily as needed for itching.   Yes  Historical Provider, MD  docusate sodium (COLACE) 50 MG capsule Take by mouth 2 (two) times daily.   Yes Historical Provider, MD  guaifenesin (ROBITUSSIN) 100 MG/5ML syrup Take 200 mg by mouth 3 (three) times daily as needed for cough.   Yes Historical Provider, MD  ibuprofen (ADVIL,MOTRIN) 800 MG tablet Take 1 tablet (800 mg total) by mouth every 8 (eight) hours as needed for pain. 11/26/12  Yes Tammi Sou, MD  metoprolol (LOPRESSOR) 50 MG tablet TAKE ONE TABLET BY MOUTH TWICE DAILY   Yes Lelon Perla, MD  Multiple Vitamin (MULTIVITAMIN) tablet Take 1 tablet by mouth daily.     Yes Historical Provider, MD  oxyCODONE-acetaminophen (PERCOCET/ROXICET) 5-325 MG per tablet Take 1 tablet by mouth every 4 (four) hours as needed for severe pain. 08/05/14  Yes Hoy Morn, MD  Polyethyl Glycol-Propyl Glycol (SYSTANE OP) Apply 1 drop to eye 4 (four) times daily.   Yes Historical Provider, MD  rosuvastatin (CRESTOR) 5 MG tablet Take 1/2 tab daily Patient taking differently: Take 2.5 mg by mouth at bedtime.  07/30/11  Yes Lelon Perla, MD  ondansetron (ZOFRAN ODT) 8 MG disintegrating tablet Take 1 tablet (8 mg total) by mouth every 8 (eight) hours as needed for nausea or vomiting. 08/05/14   Hoy Morn, MD      Allergies  Allergen Reactions  . Clindamycin/Lincomycin Itching    Difficulty breathing  . Penicillins Hives and Rash  . Sulfonamide Derivatives Hives and Rash     Social History:  reports that he has never smoked. He has never used smokeless tobacco. He reports that he does not drink alcohol or use illicit drugs.     Family History  Problem Relation Age of Onset  . Cancer Mother 66    "male"  . Heart attack Father 23  . Heart disease Father   . Heart disease Brother        Physical Exam:  GEN:  Pleasant Elderly 80 y.o. Caucasian male examined  and in no acute distress; cooperative with exam Filed Vitals:   08/06/14 2245 08/06/14 2315 08/06/14 2345 08/07/14  0030  BP: 189/79 191/89 197/85 197/81  Pulse: 52 55 53 54  Temp:      TempSrc:      Resp: 15 21 21 19   SpO2: 96% 95% 97% 95%   Blood pressure 197/81, pulse 54, temperature 98.2 F (36.8 C), temperature source Oral, resp. rate 19, SpO2 95 %. PSYCH: He is alert and oriented x4; does not appear anxious does not appear depressed; affect is normal HEENT: Normocephalic and Atraumatic, Mucous membranes pink; PERRLA; EOM intact; Fundi:  Benign;  No scleral icterus, Nares: Patent, Oropharynx: Clear,  Fair Dentition,    Neck:  FROM, No Cervical Lymphadenopathy nor Thyromegaly or Carotid Bruit; No JVD;  Breasts:: Not examined CHEST WALL: No tenderness CHEST: Normal respiration, clear to auscultation bilaterally HEART: Regular rate and rhythm; no murmurs rubs or gallops BACK: No kyphosis or scoliosis; No CVA tenderness ABDOMEN: Positive Bowel Sounds, Obese, Soft Mildly tender in Right Flank; No Masses, No Organomegaly. Rectal Exam: Not done EXTREMITIES: No Cyanosis, Clubbing, or Edema; No Ulcerations. Genitalia: not examined PULSES: 2+ and symmetric SKIN: Normal hydration no rash or ulceration CNS:  Alert and Oriented x 4,  No focal Deficits Vascular: pulses palpable throughout    Labs on Admission:  Basic Metabolic Panel:  Recent Labs Lab 08/05/14 1218 08/06/14 2212  NA 139 134*  K 4.3 4.3  CL 102 96  CO2 25 24  GLUCOSE 105* 122*  BUN 14 19  CREATININE 0.75 1.47*  CALCIUM 9.6 9.4   Liver Function Tests:  Recent Labs Lab 08/05/14 1218 08/06/14 2212  AST 36 30  ALT 43 33  ALKPHOS 71 61  BILITOT 0.4 0.5  PROT 6.7 7.0  ALBUMIN 3.7 3.7   No results for input(s): LIPASE, AMYLASE in the last 168 hours. No results for input(s): AMMONIA in the last 168 hours. CBC:  Recent Labs Lab 08/05/14 1218 08/06/14 2212  WBC 7.6 11.9*  NEUTROABS 4.5 9.3*  HGB 14.7 15.2  HCT 43.5 43.7  MCV 99.5 99.8  PLT 163 155   Cardiac Enzymes:  Recent Labs Lab 08/05/14 1357  TROPONINI  <0.30    BNP (last 3 results) No results for input(s): PROBNP in the last 8760 hours. CBG: No results for input(s): GLUCAP in the last 168 hours.  Radiological Exams on Admission: Ct Abdomen Pelvis Wo Contrast  08/05/2014   CLINICAL DATA:  Right-sided low abdominal pain extending into the back. History of kidney stones. Difficulty urinating today.  EXAM: CT ABDOMEN AND PELVIS WITHOUT CONTRAST  TECHNIQUE: Multidetector CT imaging of the abdomen and pelvis was performed following the standard protocol without IV contrast.  COMPARISON:  Pelvic MRI 05/05/2013.  Abdominal pelvic CT 06/14/2012.  FINDINGS: Lower chest: There is streaky atelectasis or scarring in both lung bases, unchanged from the prior study.  Hepatobiliary: Multiple hepatic cysts are grossly unchanged. There is diffuse hepatic steatosis. No new or enlarging liver lesions are identified. There is a small calcified gallstone in the gallbladder neck. There is no gallbladder wall distention, wall thickening or surrounding inflammatory change. There is no biliary dilatation.  Pancreas: Unremarkable. No pancreatic ductal dilatation or surrounding inflammatory changes.  Spleen: Normal in size without focal abnormality.  Adrenals/Urinary Tract: Both adrenal glands appear normal.There is a 2 mm nonobstructing calculus in the interpolar region of the right kidney. There is a tiny calculus in the lower pole of the left kidney. There is mild right-sided hydronephrosis and hydroureter. The right ureter is obstructed proximal to a 2 mm calculus on image 57. This is located at the L5 level. There is no left-sided hydronephrosis. The bladder is partly obscured by artifact from the bilateral total hip arthroplasties, but demonstrates no definite abnormality.  Stomach/Bowel: There are extensive diverticular changes throughout the descending and sigmoid colon without surrounding inflammatory change. There may be mild colonic wall thickening. The stomach and  small bowel demonstrate no significant findings. The appendix appears normal. There is no extraluminal fluid collection.  Vascular/Lymphatic: Small lymph nodes within the porta hepatis are not pathologically enlarged. There is no retroperitoneal or pelvic lymphadenopathy. There is diffuse atherosclerosis of the aorta, its branches and the iliac arteries. IVC filter noted.  Reproductive: Mild to moderate  enlargement of the prostate gland, grossly stable.  Other: There are postsurgical changes within the anterior abdominal wall status post hernia repair. There is asymmetric fat within the right inguinal canal which appears unchanged.  Musculoskeletal: No acute or significant osseous findings. Previous bilateral total hip arthroplasty. Mild lumbar spondylosis noted.  IMPRESSION: 1. Obstructing 2 mm calculus in the mid right ureter. 2. Nonobstructing bilateral renal calculi. 3. Diffuse atherosclerosis. 4. Hepatic steatosis, hepatic cysts and cholelithiasis noted.   Electronically Signed   By: Camie Patience M.D.   On: 08/05/2014 14:43      Assessment/Plan:   80 y.o. male with  Active Problems:   1.   Kidney stone/Renal colic   IVFs   Pain Control   Urology Eval in AM     2.   AKI (acute kidney injury)   IVFs   Monitor BUN/Cr     3.   CAD (coronary artery disease)   Stable    Continue Metoprolol, Crestor,      4.   HYPERTENSION, BENIGN   Metoprolol Rx   Monitor BPs       5.   HYPERCHOLESTEROLEMIA-PURE   Continue Crestor     6.   DVT Prophylaxis   Lovenox    Code Status:   FULL CODE    Family Communication:    Daughter Forrest Moron NP) at Bedside and Wife Disposition Plan:     Observation    Time spent:  81 MInutes  [facility] Pager [phone]   If [place] Please Contact the Day Rounding Team MD for [facility]  If 7PM-7AM, Please Contact Night-Floor Coverage  [email or web address] Password [facility] 08/07/2014, 12:37 AM

## 2014-08-07 NOTE — Progress Notes (Signed)
[facility]  Patient admitted earlier today for right flank pain 10/10 and dehydration.  I have examined him this morning. Pain has resolved but still has very poor urine output.   Principal Problem:   Nephrolithiasis/  Renal colic - CT from 37/94 revealed right nephrolithiasis with mild hydronephrosis - pain improving- may have passed the stone- cont to monitor- no need for further imaging - d/w urology- he will need to make and  outpt appt with urology   Active Problems: AKI (acute kidney injury) - due to nephrolithiasis and dehydration -will continue to hydrate- give one more L NS bolus    HYPERTENSION, BENIGN - cont Lopressor    CAD (coronary artery disease) - cont ASA    Hepatic steatosis and  Cholelithiasis - noted on CT   Debbe Odea, MD

## 2014-08-07 NOTE — Progress Notes (Signed)
UR completed 

## 2014-08-08 ENCOUNTER — Inpatient Hospital Stay (HOSPITAL_COMMUNITY): Payer: Medicare Other

## 2014-08-08 ENCOUNTER — Observation Stay (HOSPITAL_COMMUNITY): Payer: Medicare Other

## 2014-08-08 DIAGNOSIS — Z882 Allergy status to sulfonamides status: Secondary | ICD-10-CM | POA: Diagnosis not present

## 2014-08-08 DIAGNOSIS — N23 Unspecified renal colic: Secondary | ICD-10-CM | POA: Diagnosis not present

## 2014-08-08 DIAGNOSIS — R109 Unspecified abdominal pain: Secondary | ICD-10-CM | POA: Diagnosis not present

## 2014-08-08 DIAGNOSIS — Z880 Allergy status to penicillin: Secondary | ICD-10-CM | POA: Diagnosis not present

## 2014-08-08 DIAGNOSIS — M1990 Unspecified osteoarthritis, unspecified site: Secondary | ICD-10-CM | POA: Diagnosis present

## 2014-08-08 DIAGNOSIS — K760 Fatty (change of) liver, not elsewhere classified: Secondary | ICD-10-CM | POA: Diagnosis not present

## 2014-08-08 DIAGNOSIS — I2510 Atherosclerotic heart disease of native coronary artery without angina pectoris: Secondary | ICD-10-CM | POA: Diagnosis present

## 2014-08-08 DIAGNOSIS — Z8673 Personal history of transient ischemic attack (TIA), and cerebral infarction without residual deficits: Secondary | ICD-10-CM | POA: Diagnosis not present

## 2014-08-08 DIAGNOSIS — Z881 Allergy status to other antibiotic agents status: Secondary | ICD-10-CM | POA: Diagnosis not present

## 2014-08-08 DIAGNOSIS — E785 Hyperlipidemia, unspecified: Secondary | ICD-10-CM | POA: Diagnosis present

## 2014-08-08 DIAGNOSIS — E669 Obesity, unspecified: Secondary | ICD-10-CM | POA: Diagnosis present

## 2014-08-08 DIAGNOSIS — E860 Dehydration: Secondary | ICD-10-CM | POA: Diagnosis present

## 2014-08-08 DIAGNOSIS — N201 Calculus of ureter: Secondary | ICD-10-CM | POA: Diagnosis not present

## 2014-08-08 DIAGNOSIS — E780 Pure hypercholesterolemia: Secondary | ICD-10-CM | POA: Diagnosis not present

## 2014-08-08 DIAGNOSIS — K8020 Calculus of gallbladder without cholecystitis without obstruction: Secondary | ICD-10-CM | POA: Diagnosis not present

## 2014-08-08 DIAGNOSIS — N179 Acute kidney failure, unspecified: Secondary | ICD-10-CM | POA: Diagnosis not present

## 2014-08-08 DIAGNOSIS — Z7982 Long term (current) use of aspirin: Secondary | ICD-10-CM | POA: Diagnosis not present

## 2014-08-08 DIAGNOSIS — Z86718 Personal history of other venous thrombosis and embolism: Secondary | ICD-10-CM | POA: Diagnosis not present

## 2014-08-08 DIAGNOSIS — R944 Abnormal results of kidney function studies: Secondary | ICD-10-CM | POA: Diagnosis not present

## 2014-08-08 DIAGNOSIS — N200 Calculus of kidney: Secondary | ICD-10-CM | POA: Diagnosis not present

## 2014-08-08 DIAGNOSIS — Z951 Presence of aortocoronary bypass graft: Secondary | ICD-10-CM | POA: Diagnosis not present

## 2014-08-08 DIAGNOSIS — I10 Essential (primary) hypertension: Secondary | ICD-10-CM | POA: Diagnosis not present

## 2014-08-08 DIAGNOSIS — Z6830 Body mass index (BMI) 30.0-30.9, adult: Secondary | ICD-10-CM | POA: Diagnosis not present

## 2014-08-08 DIAGNOSIS — N202 Calculus of kidney with calculus of ureter: Secondary | ICD-10-CM | POA: Diagnosis present

## 2014-08-08 DIAGNOSIS — I252 Old myocardial infarction: Secondary | ICD-10-CM | POA: Diagnosis not present

## 2014-08-08 DIAGNOSIS — N132 Hydronephrosis with renal and ureteral calculous obstruction: Secondary | ICD-10-CM | POA: Diagnosis present

## 2014-08-08 DIAGNOSIS — Z85820 Personal history of malignant melanoma of skin: Secondary | ICD-10-CM | POA: Diagnosis not present

## 2014-08-08 DIAGNOSIS — Z96643 Presence of artificial hip joint, bilateral: Secondary | ICD-10-CM | POA: Diagnosis present

## 2014-08-08 LAB — CBC
HEMATOCRIT: 39.7 % (ref 39.0–52.0)
HEMOGLOBIN: 13.8 g/dL (ref 13.0–17.0)
MCH: 34.6 pg — ABNORMAL HIGH (ref 26.0–34.0)
MCHC: 34.8 g/dL (ref 30.0–36.0)
MCV: 99.5 fL (ref 78.0–100.0)
Platelets: 146 10*3/uL — ABNORMAL LOW (ref 150–400)
RBC: 3.99 MIL/uL — ABNORMAL LOW (ref 4.22–5.81)
RDW: 13.3 % (ref 11.5–15.5)
WBC: 9.3 10*3/uL (ref 4.0–10.5)

## 2014-08-08 LAB — BASIC METABOLIC PANEL
ANION GAP: 14 (ref 5–15)
BUN: 17 mg/dL (ref 6–23)
CHLORIDE: 101 meq/L (ref 96–112)
CO2: 22 mEq/L (ref 19–32)
Calcium: 8.2 mg/dL — ABNORMAL LOW (ref 8.4–10.5)
Creatinine, Ser: 1.26 mg/dL (ref 0.50–1.35)
GFR calc non Af Amer: 52 mL/min — ABNORMAL LOW (ref >90)
GFR, EST AFRICAN AMERICAN: 60 mL/min — AB (ref >90)
Glucose, Bld: 114 mg/dL — ABNORMAL HIGH (ref 70–99)
Potassium: 3.9 mEq/L (ref 3.7–5.3)
SODIUM: 137 meq/L (ref 137–147)

## 2014-08-08 MED ORDER — ENSURE COMPLETE PO LIQD
237.0000 mL | Freq: Three times a day (TID) | ORAL | Status: DC
  Administered 2014-08-08: 237 mL via ORAL

## 2014-08-08 MED ORDER — ENOXAPARIN SODIUM 40 MG/0.4ML ~~LOC~~ SOLN
40.0000 mg | SUBCUTANEOUS | Status: DC
  Filled 2014-08-08: qty 0.4

## 2014-08-08 MED ORDER — SENNOSIDES-DOCUSATE SODIUM 8.6-50 MG PO TABS
2.0000 | ORAL_TABLET | Freq: Every day | ORAL | Status: DC
  Administered 2014-08-08: 2 via ORAL
  Filled 2014-08-08: qty 2

## 2014-08-08 MED ORDER — SALINE SPRAY 0.65 % NA SOLN
1.0000 | NASAL | Status: DC | PRN
  Filled 2014-08-08: qty 44

## 2014-08-08 MED ORDER — POLYETHYLENE GLYCOL 3350 17 G PO PACK: 17.0000 g | PACK | Freq: Every day | ORAL | Status: DC | PRN

## 2014-08-08 MED ORDER — ADULT MULTIVITAMIN W/MINERALS CH
1.0000 | ORAL_TABLET | Freq: Every day | ORAL | Status: DC
  Administered 2014-08-09: 1 via ORAL
  Filled 2014-08-08: qty 1

## 2014-08-08 NOTE — Consult Note (Signed)
Urology Consult   Physician requesting consult: Ashok Cordia  Reason for consult: Right ureterolithiasis  History of Present Illness: Bernard Byrd is a 80 y.o. male with a history of nephrolithiasis as well as coronary artery disease who is currently admitted for right sided renal colic associated with right-sided small ureteral stone. He first presented to the emergency department on Friday and was diagnosed with a right sided midureteral stone, 2 mm.  He was discharged on medical expulsive therapy but returned to the hospital on Saturday with persistent right sided renal colic type pain, primarily in the right lower quadrant radiating to the right testicle. He was also found to have acute kidney injury with a creatinine elevated to 1.47 as well as decreased urine output. He was afebrile with otherwise stable vital signs within normal limits and was admitted for medical expulsive therapy and pain management as well as rehydration.  Since being admitted, he has required intermittent narcotic pain medicine to control his pain. His pain is persistent. It has not changed in character. He has not had any fevers or chills. His white blood cell count is 9.3, down from 11.9. His creatinine is improving, now equal to 1.26 from 1.47.  He does have a history of nephrolithiasis and has undergone prior ureteroscopy in [facility]. This was many years ago.   Past Medical History  Diagnosis Date  . DVT (deep venous thrombosis) 2009    Post-op from laparoscopic inguinal hernia repair  . CAD (coronary artery disease)     CABG 2008:  myoview  2010 showed no ischemia and a normal EF  . Nephrolithiasis   . Hyperlipidemia   . Hypertension   . Prostate nodule     multiple benign biopsies (Dr Rosana Hoes)  . MI (myocardial infarction) 10/14/2006    Inferior.    . Osteoarthritis     Primarily hips  . Melanoma     skin graft surgery required  . Groin pain, left lower quadrant     Chronic, intermittent: believed  to be due to scarring around MESH placed at inguinal hernia repair  . CVA (cerebral vascular accident)     possible/no residual deficit--following CABG   . Retroperitoneal hematoma 2009    on lovenox; greenfield filter placed after this  . Diverticulosis of rectosigmoid     Noted on CT 04/2011 done to eval chronic groin pain  . MELANOMA, HX OF 12/02/2008  . Nuclear sclerotic cataract of both eyes 2015    Past Surgical History  Procedure Laterality Date  . Hip replacement bilateral      right was 2004, left 2011  . Rotator cuff repair    . Status post coronary bypass graft  10/14/06     (LIMA to the LAD, saphenous vein graft to the diagonal, saphenous vein graft to the mid obtuse marginal, saphenous vein graft placed in a sequential fashion ot the RV branch of the right coronary artery and distal right coronary artery  . Carpal tunnel repair b    . Trigger finger x 3    . Kidney stone removal  01/18/88  . Melanoma excision  08/1990    left shoulder - skin graft from leg  . Hernia repair  05/20/2008    open, X 3  . Colonoscopy w/ polypectomy  02/07/09    Adenomatous polyps, severe diverticulosis, internal hemorrhoids.  Repeat 2015  . Greenfield filter      Medications:  Home meds:    Medication List    ASK  your doctor about these medications        aspirin 325 MG tablet  Take 325 mg by mouth daily.     diphenhydrAMINE 12.5 MG/5ML liquid  Commonly known as:  BENADRYL  Take 25 mg by mouth daily as needed for itching.     docusate sodium 50 MG capsule  Commonly known as:  COLACE  Take by mouth 2 (two) times daily.     guaifenesin 100 MG/5ML syrup  Commonly known as:  ROBITUSSIN  Take 200 mg by mouth 3 (three) times daily as needed for cough.     ibuprofen 800 MG tablet  Commonly known as:  ADVIL,MOTRIN  Take 1 tablet (800 mg total) by mouth every 8 (eight) hours as needed for pain.     metoprolol 50 MG tablet  Commonly known as:  LOPRESSOR  TAKE ONE TABLET BY MOUTH TWICE  DAILY     multivitamin tablet  Take 1 tablet by mouth daily.     ondansetron 8 MG disintegrating tablet  Commonly known as:  ZOFRAN ODT  Take 1 tablet (8 mg total) by mouth every 8 (eight) hours as needed for nausea or vomiting.     oxyCODONE-acetaminophen 5-325 MG per tablet  Commonly known as:  PERCOCET/ROXICET  Take 1 tablet by mouth every 4 (four) hours as needed for severe pain.     rosuvastatin 5 MG tablet  Commonly known as:  CRESTOR  Take 1/2 tab daily     SYSTANE OP  Apply 1 drop to eye 4 (four) times daily.        Scheduled Meds: . aspirin  325 mg Oral Daily  . docusate sodium  100 mg Oral BID  . enoxaparin (LOVENOX) injection  40 mg Subcutaneous Q24H  . feeding supplement (ENSURE COMPLETE)  237 mL Oral TID PC & HS  . metoprolol  50 mg Oral BID  . multivitamin with minerals  1 tablet Oral Daily  . [START ON 08/09/2014] multivitamin with minerals  1 tablet Oral Daily  . ondansetron (ZOFRAN) IV  4 mg Intravenous Once  . rosuvastatin  2.5 mg Oral QHS  . tamsulosin  0.4 mg Oral Daily   Continuous Infusions: . sodium chloride 1 mL (08/08/14 0544)   PRN Meds:.acetaminophen **OR** acetaminophen, alum & mag hydroxide-simeth, hydrALAZINE, HYDROmorphone (DILAUDID) injection, ondansetron **OR** ondansetron (ZOFRAN) IV, oxyCODONE, sodium chloride, sodium chloride  Allergies:  Allergies  Allergen Reactions  . Clindamycin/Lincomycin Itching    Difficulty breathing  . Penicillins Hives and Rash  . Sulfonamide Derivatives Hives and Rash    Family History  Problem Relation Age of Onset  . Cancer Mother 70    "male"  . Heart attack Father 41  . Heart disease Father   . Heart disease Brother     Social History:  reports that he has never smoked. He has never used smokeless tobacco. He reports that he does not drink alcohol or use illicit drugs.  ROS: A complete review of systems was performed.  All systems are negative except for pertinent findings as  noted.  Physical Exam:  Vital signs in last 24 hours: Temp:  [97.8 F (36.6 C)-99.3 F (37.4 C)] 98 F (36.7 C) (12/21 0919) Pulse Rate:  [55-78] 78 (12/21 0919) Resp:  [16-18] 16 (12/21 0919) BP: (138-195)/(60-88) 138/72 mmHg (12/21 0919) SpO2:  [92 %-96 %] 94 % (12/21 0919) Constitutional:  Alert and oriented, No acute distress Cardiovascular: Regular rate and rhythm, No JVD Respiratory: Normal respiratory effort, Lungs clear bilaterally  GI: Abdomen is soft, nondistended, no abdominal masses, right lower quadrant tenderness to palpation Genitourinary: No CVAT. Normal male phallus, testes are descended bilaterally and non-tender and without masses, scrotum is normal in appearance without lesions or masses. Lymphatic: No lymphadenopathy Neurologic: Grossly intact, no focal deficits Psychiatric: Normal mood and affect  Laboratory Data:   Recent Labs  08/05/14 1218 08/06/14 2212 08/07/14 0500 08/08/14 0854  WBC 7.6 11.9* 8.3 9.3  HGB 14.7 15.2 13.9 13.8  HCT 43.5 43.7 41.1 39.7  PLT 163 155 140* 146*     Recent Labs  08/05/14 1218 08/06/14 2212 08/07/14 0500 08/08/14 0854  NA 139 134* 134* 137  K 4.3 4.3 4.0 3.9  CL 102 96 99 101  GLUCOSE 105* 122* 104* 114*  BUN 14 19 20 17   CALCIUM 9.6 9.4 8.6 8.2*  CREATININE 0.75 1.47* 1.43* 1.26     Results for orders placed or performed during the hospital encounter of 08/06/14 (from the past 24 hour(s))  Basic metabolic panel     Status: Abnormal   Collection Time: 08/08/14  8:54 AM  Result Value Ref Range   Sodium 137 137 - 147 mEq/L   Potassium 3.9 3.7 - 5.3 mEq/L   Chloride 101 96 - 112 mEq/L   CO2 22 19 - 32 mEq/L   Glucose, Bld 114 (H) 70 - 99 mg/dL   BUN 17 6 - 23 mg/dL   Creatinine, Ser 1.26 0.50 - 1.35 mg/dL   Calcium 8.2 (L) 8.4 - 10.5 mg/dL   GFR calc non Af Amer 52 (L) >90 mL/min   GFR calc Af Amer 60 (L) >90 mL/min   Anion gap 14 5 - 15  CBC     Status: Abnormal   Collection Time: 08/08/14  8:54 AM   Result Value Ref Range   WBC 9.3 4.0 - 10.5 K/uL   RBC 3.99 (L) 4.22 - 5.81 MIL/uL   Hemoglobin 13.8 13.0 - 17.0 g/dL   HCT 39.7 39.0 - 52.0 %   MCV 99.5 78.0 - 100.0 fL   MCH 34.6 (H) 26.0 - 34.0 pg   MCHC 34.8 30.0 - 36.0 g/dL   RDW 13.3 11.5 - 15.5 %   Platelets 146 (L) 150 - 400 K/uL   No results found for this or any previous visit (from the past 240 hour(s)).  Renal Function:  Recent Labs  08/05/14 1218 08/06/14 2212 08/07/14 0500 08/08/14 0854  CREATININE 0.75 1.47* 1.43* 1.26   Estimated Creatinine Clearance: 55.6 mL/min (by C-G formula based on Cr of 1.26).  Radiologic Imaging: Dg Abd Portable 1v  08/08/2014   CLINICAL DATA:  RIGHT flank pain since last Friday  EXAM: PORTABLE ABDOMEN - 1 VIEW  COMPARISON:  Portable exam 0953 hr without priors for comparison ; correlation made to CT abdomen and pelvis exam of 08/05/2014  FINDINGS: IVC filter noted.  BILATERAL hip prostheses.  Question prior LEFT inguinal hernia repair.  Normal bowel gas pattern without bowel dilatation or wall thickening.  No urinary tract calcification identified.  Specifically, the 2 mm mid RIGHT ureteral calculus seen on prior CT exam is not identified.  No acute osseous findings.  IMPRESSION: No urinary tract calcification identified.  With a small size of the previously identified calculus, this could be present but radiographically occult; if there is persistent clinical concern for retained RIGHT ureteral calculus, recommend repeat CT imaging.   Electronically Signed   By: Lavonia Dana M.D.   On: 08/08/2014 10:14  I independently reviewed the above imaging studies.  Impression/Recommendation 80 year old with symptomatic right 2 mm mid ureteral stone with persistent right-sided renal colic pain despite medical expulsive therapy for the past 2 days.  He is clinically stable but continues to have significant pain.  Dr. Jeffie Pollock will meet with patient this afternoon to discuss options.  Continued  medical expulsive therapy vs. Cystoscopy with ureteral stent vs. Stone treatment are all reasonable options.   - Please keep patient NPO for possible procedure later today  I have seen and examined the patient and reviewed the pertinent films and labs.    I am going to get a repeat CT stone study today and if the stone is still present, I will consider ureteroscopic extraction tonight.

## 2014-08-08 NOTE — Progress Notes (Signed)
Nutrition Brief Note  Patient identified on the Malnutrition Screening Tool (MST) Report  Wt Readings from Last 15 Encounters:  08/07/14 222 lb (100.699 kg)  08/05/14 225 lb (102.059 kg)  08/04/14 229 lb (103.874 kg)  05/18/14 223 lb (101.152 kg)  04/26/14 226 lb (102.513 kg)  03/17/14 228 lb (103.42 kg)  12/15/13 221 lb (100.245 kg)  09/15/13 226 lb (102.513 kg)  03/11/13 228 lb (103.42 kg)  11/18/12 224 lb (101.606 kg)  09/11/12 224 lb (101.606 kg)  11/21/11 220 lb (99.791 kg)  11/13/11 221 lb 1.3 oz (100.281 kg)  09/13/11 217 lb (98.431 kg)  05/17/11 220 lb (99.791 kg)    Body mass index is 30.98 kg/(m^2). Patient meets criteria for obese, class 1 based on current BMI.   Current diet order is NPO. Labs and medications reviewed.   Patient states he has been eating well at home with recent weight gain over the holidays.   Patient denies nutrition-related concerns. No nutrition interventions warranted at this time. NPO until MD visit this afternoon with plan for kidney stone removal. If nutrition issues arise, please consult RD.   Brynda Greathouse, MS RD LDN Clinical Inpatient Dietitian Weekend/After hours pager: [phone]

## 2014-08-08 NOTE — Progress Notes (Signed)
I have reviewed the CT films from today and there is persistent hydro with some perinephric stranding but the stone is no longer in the proximal ureter and appears to either be at the UVJ on the right or has passed.   The ureter is dilated to the bladder but there is significant beam hardening artifact in the pelvis and the terminal ureter is not well seen.   I discussed the findings with the family and suggested that we continue to observe him for now since the pain has abated and the stone has obviously progressed and is likely near passage.    They are going to think about it and let me know.

## 2014-08-08 NOTE — Progress Notes (Signed)
[facility] Progress Note   Bernard Byrd DHR:[id number] DOB: [birth year] DOA: 08/06/2014 PCP: Tammi Sou, MD  Brief narrative: Bernard Byrd is a 80 y.o. male with recent diagnose of right sided nephrolithiasis admitted for right flank pain 10/10. and dehydration.   Subjective: Continues to have right flank pain today-   Assessment/Plan: Nephrolithiasis/ Renal colic - CT from 46/80 revealed right nephrolithiasis with mild hydronephrosis - pain recurred this AM- no tenderness on my exam as he had just received pain medications - d/w urology again today- repeat CT shows that the stone has migrated but hip replacements obscuring images and unable to tell if stone has completely passed- per Dr Jeffie Pollock, cont conservative management- cont pain control and IVF  Active Problems: AKI (acute kidney injury) - due to nephrolithiasis and dehydration - improved -will continue to hydrate-   HYPERTENSION, BENIGN - cont Lopressor   CAD (coronary artery disease) - cont ASA   Hepatic steatosis and Cholelithiasis - noted on CT    Code Status: full code Family Communication: Forrest Moron, daughter Disposition Plan: home when stable DVT prophylaxis: Lovenox  Consultants: Urology  Procedures: none  Antibiotics: Anti-infectives    None     Objective: Filed Weights   08/07/14 0155  Weight: 100.699 kg (222 lb)    Intake/Output Summary (Last 24 hours) at 08/08/14 1656 Last data filed at 08/08/14 1255  Gross per 24 hour  Intake 2499.67 ml  Output   1050 ml  Net 1449.67 ml     Vitals Filed Vitals:   08/08/14 0516 08/08/14 0710 08/08/14 0919 08/08/14 1045  BP: 195/88 178/88 138/72 168/69  Pulse: 66  78   Temp: 98.6 F (37 C)  98 F (36.7 C)   TempSrc: Oral  Oral   Resp: 17  16   Height:      Weight:      SpO2: 93%  94%     Exam: General: AAO x3, No acute respiratory distress Lungs: Clear to auscultation bilaterally without wheezes or  crackles Cardiovascular: Regular rate and rhythm without murmur gallop or rub normal S1 and S2 Abdomen: Nontender, nondistended, soft, bowel sounds positive, no rebound, no ascites, no appreciable mass Extremities: No significant cyanosis, clubbing, or edema bilateral lower extremities  Data Reviewed: Basic Metabolic Panel:  Recent Labs Lab 08/05/14 1218 08/06/14 2212 08/07/14 0500 08/08/14 0854  NA 139 134* 134* 137  K 4.3 4.3 4.0 3.9  CL 102 96 99 101  CO2 25 24 21 22   GLUCOSE 105* 122* 104* 114*  BUN 14 19 20 17   CREATININE 0.75 1.47* 1.43* 1.26  CALCIUM 9.6 9.4 8.6 8.2*   Liver Function Tests:  Recent Labs Lab 08/05/14 1218 08/06/14 2212  AST 36 30  ALT 43 33  ALKPHOS 71 61  BILITOT 0.4 0.5  PROT 6.7 7.0  ALBUMIN 3.7 3.7   No results for input(s): LIPASE, AMYLASE in the last 168 hours. No results for input(s): AMMONIA in the last 168 hours. CBC:  Recent Labs Lab 08/05/14 1218 08/06/14 2212 08/07/14 0500 08/08/14 0854  WBC 7.6 11.9* 8.3 9.3  NEUTROABS 4.5 9.3*  --   --   HGB 14.7 15.2 13.9 13.8  HCT 43.5 43.7 41.1 39.7  MCV 99.5 99.8 100.7* 99.5  PLT 163 155 140* 146*   Cardiac Enzymes:  Recent Labs Lab 08/05/14 1357  TROPONINI <0.30   BNP (last 3 results) No results for input(s): PROBNP in the last 8760 hours. CBG: No results for input(s): GLUCAP  in the last 168 hours.  No results found for this or any previous visit (from the past 240 hour(s)).   Studies:  Recent x-ray studies have been reviewed in detail by the Attending Physician  Scheduled Meds:  Scheduled Meds: . aspirin  325 mg Oral Daily  . docusate sodium  100 mg Oral BID  . enoxaparin (LOVENOX) injection  40 mg Subcutaneous Q24H  . feeding supplement (ENSURE COMPLETE)  237 mL Oral TID PC & HS  . metoprolol  50 mg Oral BID  . [START ON 08/09/2014] multivitamin with minerals  1 tablet Oral Daily  . ondansetron (ZOFRAN) IV  4 mg Intravenous Once  . rosuvastatin  2.5 mg Oral  QHS  . tamsulosin  0.4 mg Oral Daily   Continuous Infusions: . sodium chloride 1 mL (08/08/14 0544)    Time spent on care of this patient: 35 min   [place], MD 08/08/2014, 4:56 PM  LOS: 2 days   [facility] Office  [phone] Pager - Text Page per [email or web address]  If 7PM-7AM, please contact night-coverage [email or web address]

## 2014-08-09 ENCOUNTER — Other Ambulatory Visit: Payer: Self-pay

## 2014-08-09 ENCOUNTER — Telehealth: Payer: Self-pay | Admitting: Family Medicine

## 2014-08-09 LAB — CBC
HCT: 36.1 % — ABNORMAL LOW (ref 39.0–52.0)
Hemoglobin: 12.2 g/dL — ABNORMAL LOW (ref 13.0–17.0)
MCH: 33.8 pg (ref 26.0–34.0)
MCHC: 33.8 g/dL (ref 30.0–36.0)
MCV: 100 fL (ref 78.0–100.0)
PLATELETS: 133 10*3/uL — AB (ref 150–400)
RBC: 3.61 MIL/uL — ABNORMAL LOW (ref 4.22–5.81)
RDW: 13.3 % (ref 11.5–15.5)
WBC: 5.8 10*3/uL (ref 4.0–10.5)

## 2014-08-09 LAB — BASIC METABOLIC PANEL
Anion gap: 6 (ref 5–15)
BUN: 11 mg/dL (ref 6–23)
CHLORIDE: 108 meq/L (ref 96–112)
CO2: 23 mmol/L (ref 19–32)
Calcium: 8.1 mg/dL — ABNORMAL LOW (ref 8.4–10.5)
Creatinine, Ser: 0.91 mg/dL (ref 0.50–1.35)
GFR, EST AFRICAN AMERICAN: 90 mL/min — AB (ref >90)
GFR, EST NON AFRICAN AMERICAN: 77 mL/min — AB (ref >90)
GLUCOSE: 128 mg/dL — AB (ref 70–99)
POTASSIUM: 3.3 mmol/L — AB (ref 3.5–5.1)
SODIUM: 137 mmol/L (ref 135–145)

## 2014-08-09 MED ORDER — OXYCODONE HCL 5 MG PO TABS: 10.0000 mg | ORAL_TABLET | Freq: Four times a day (QID) | ORAL | Status: DC | PRN

## 2014-08-09 MED ORDER — POTASSIUM CHLORIDE CRYS ER 20 MEQ PO TBCR: 40.0000 meq | EXTENDED_RELEASE_TABLET | Freq: Once | ORAL | Status: DC

## 2014-08-09 MED ORDER — POTASSIUM CHLORIDE CRYS ER 20 MEQ PO TBCR
40.0000 meq | EXTENDED_RELEASE_TABLET | Freq: Once | ORAL | Status: AC
  Administered 2014-08-09: 40 meq via ORAL
  Filled 2014-08-09: qty 2

## 2014-08-09 MED ORDER — IBUPROFEN 800 MG PO TABS: 800.0000 mg | ORAL_TABLET | Freq: Three times a day (TID) | ORAL | Status: DC | PRN

## 2014-08-09 MED ORDER — TAMSULOSIN HCL 0.4 MG PO CAPS: 0.4000 mg | ORAL_CAPSULE | Freq: Every day | ORAL | Status: DC

## 2014-08-09 NOTE — Discharge Summary (Signed)
Physician Discharge Summary  Bernard Byrd:[id number] DOB: Mar 20, 1931 DOA: 08/06/2014  PCP: Tammi Sou, MD  Admit date: 08/06/2014 Discharge date: 08/09/2014  Time spent: 35 minutes  Recommendations for Outpatient Follow-up:  1. Follow-up on repeat BMP in 1 week  Discharge Diagnoses:  Principal Problem:   Nephrolithiasis Active Problems:   HYPERCHOLESTEROLEMIA-PURE   HYPERTENSION, BENIGN   CAD (coronary artery disease)   Renal colic   AKI (acute kidney injury)   Hepatic steatosis   Cholelithiasis   Kidney stone   Discharge Condition: Stable/improved  Diet recommendation: Regular diet  Filed Weights   08/07/14 0155  Weight: 100.699 kg (222 lb)    History of present illness:  Bernard Byrd is a 80 y.o. male with a history of CAD, HTN, Hyperlipidemia, Nephrolithiasis, who began to have Right sided ABD and Flank Pain x 3 days and was seen in the ED 12 days ago and diagnosed with a Uretal Stone on imaging who returns to the ED with complaints of renal colic and decreased urination over he past 24 hours and severe pain. His creatinine had doubled over the last 24 hours from 0.7 to 1.4. He was referred for observation  Hospital Course:  Patient is an 80 year old with a past medical history of coronary artery disease, hypertension, dyslipidemia, history of nephrolithiasis who was admitted to the medicine service on 08/07/2014. He presented with complaints of severe right-sided flank pain associated with decreased urine output. Initial lab work revealed the development of acute kidney injury having a creatinine 1.47. CT scan of abdomen showed progressive right-sided perinephritic and ureter edema/fluid, with presence of tiny stone seen in the right ureter. Patient was seen and evaluated by Dr. Jeffie Pollock of urology during this hospitalization. He was hydrated with IV fluids as lab work revealed resolution to acute kidney injury. By 08/09/2014 he reported significantly  better after reporting passing stones the night before. Given significant clinical improvement he was discharged to his home in stable condition on 08/09/2014 and instructed to follow-up with his urologist Dr. Rosana Hoes in [place].  Consultations:  Urology  Discharge Exam: Filed Vitals:   08/09/14 0601  BP: 137/63  Pulse: 56  Temp: 97.7 F (36.5 C)  Resp: 16    General: Patient is in no acute distress, he is awake alert, denies pain Cardiovascular: Regular rate and rhythm normal S1-S2 no murmurs rubs or gallops Respiratory: Clear to auscultation bilaterally no wheezing rhonchi rales Abdomen: Having benign abdominal examination, soft nontender nondistended Extremities: No edema  Discharge Instructions   Discharge Instructions    Call MD for:  difficulty breathing, headache or visual disturbances    Complete by:  As directed      Call MD for:  extreme fatigue    Complete by:  As directed      Call MD for:  hives    Complete by:  As directed      Call MD for:  persistant dizziness or light-headedness    Complete by:  As directed      Call MD for:  persistant nausea and vomiting    Complete by:  As directed      Call MD for:  redness, tenderness, or signs of infection (pain, swelling, redness, odor or green/yellow discharge around incision site)    Complete by:  As directed      Call MD for:  severe uncontrolled pain    Complete by:  As directed      Call MD for:  temperature >100.4  Complete by:  As directed      Diet - low sodium heart healthy    Complete by:  As directed      Increase activity slowly    Complete by:  As directed           Current Discharge Medication List    START taking these medications   Details  oxyCODONE (OXY IR/ROXICODONE) 5 MG immediate release tablet Take 2 tablets (10 mg total) by mouth every 6 (six) hours as needed for moderate pain. Qty: 10 tablet, Refills: 0    tamsulosin (FLOMAX) 0.4 MG CAPS capsule Take 1 capsule (0.4 mg total)  by mouth daily. Qty: 30 capsule, Refills: 1      CONTINUE these medications which have NOT CHANGED   Details  aspirin 325 MG tablet Take 325 mg by mouth daily.      diphenhydrAMINE (BENADRYL) 12.5 MG/5ML liquid Take 25 mg by mouth daily as needed for itching.    docusate sodium (COLACE) 50 MG capsule Take by mouth 2 (two) times daily.    guaifenesin (ROBITUSSIN) 100 MG/5ML syrup Take 200 mg by mouth 3 (three) times daily as needed for cough.    metoprolol (LOPRESSOR) 50 MG tablet TAKE ONE TABLET BY MOUTH TWICE DAILY Qty: 180 tablet, Refills: 1    Multiple Vitamin (MULTIVITAMIN) tablet Take 1 tablet by mouth daily.      Polyethyl Glycol-Propyl Glycol (SYSTANE OP) Apply 1 drop to eye 4 (four) times daily.    rosuvastatin (CRESTOR) 5 MG tablet Take 1/2 tab daily Qty: 30 tablet, Refills: 12    ondansetron (ZOFRAN ODT) 8 MG disintegrating tablet Take 1 tablet (8 mg total) by mouth every 8 (eight) hours as needed for nausea or vomiting. Qty: 10 tablet, Refills: 0      STOP taking these medications     oxyCODONE-acetaminophen (PERCOCET/ROXICET) 5-325 MG per tablet      ibuprofen (ADVIL,MOTRIN) 800 MG tablet        Allergies  Allergen Reactions  . Clindamycin/Lincomycin Itching    Difficulty breathing  . Penicillins Hives and Rash  . Sulfonamide Derivatives Hives and Rash   Follow-up Information    Follow up with DAVIS III, RONALD L, MD.   Specialty:  Urology   Why:  Please call for a f/u appt with Dr. Rosana Hoes in the next few weeks for a repeat urine check.    Contact information:   [address] [place] [address] [phone]       Follow up with Tammi Sou, MD In 2 weeks.   Specialty:  Family Medicine   Contact information:   [address] [place] [address] [phone]        The results of significant diagnostics from this hospitalization (including imaging, microbiology, ancillary and laboratory) are listed below for reference.     Significant Diagnostic Studies: Ct Abdomen Pelvis Wo Contrast  08/08/2014   CLINICAL DATA:  Subsequent encounter for 3 day history of right ureteral stone.  EXAM: CT ABDOMEN AND PELVIS WITHOUT CONTRAST  TECHNIQUE: Multidetector CT imaging of the abdomen and pelvis was performed following the standard protocol without IV contrast.  COMPARISON:  08/05/2014.  FINDINGS: Lower chest: Dependent atelectasis noted in the lower lobes bilaterally.  Hepatobiliary: Several hepatic cysts again noted, stable. Tiny calcified stone noted in the neck of the gallbladder. No intrahepatic or extrahepatic biliary dilation.  Pancreas: No focal mass lesion. No dilatation of the main duct. No intraparenchymal cyst.  No peripancreatic edema.  Spleen: No splenomegaly. No focal mass lesion.  Adrenals/Urinary Tract: No adrenal nodule or mass. One-2 mm nonobstructing stone identified towards the lower pole the right kidney. Interval increase and right perinephric edema/fluid. Right ureter remains distended. Tiny stones seen in the mid right ureter on the previous study has migrated in the interval, but the distal right ureter and posterior bladder are obscured by streak artifact from the bilateral hip replacement. Stable tiny nonobstructing left renal stone. Water density lesions in the left kidney are likely related to cysts.  Stomach/Bowel: Small hiatal hernia noted. Stomach otherwise normal. Duodenum is normally positioned as is the ligament of Treitz. No small bowel wall thickening. No small bowel dilatation. Terminal ileum is normal. The appendix is normal. Diverticular changes are noted in the distal colon without diverticulitis.  Vascular/Lymphatic: Atherosclerotic calcification is noted in the wall of the abdominal aorta without aneurysm. IVC filter is evident in-situ no gastrohepatic or hepatoduodenal ligament lymphadenopathy. No retroperitoneal lymphadenopathy. No pelvic sidewall lymphadenopathy.  Reproductive: Prostate gland  not well seen secondary to streak artifact.  Other: No gross intraperitoneal free fluid. Right inguinal hernia contains only fat. Patient is probably status post left inguinal hernia repair.  Musculoskeletal: Status post bilateral hip replacement. Bone windows reveal no worrisome lytic or sclerotic osseous lesions.  IMPRESSION: Progressive right-sided perinephric and periureteric edema/fluid. Tiny stones seen in the right ureter on the previous study is not evident on the current exam although the distal right ureter and posterior bladder obscured by streak artifact from the hip replacements.  Bilateral nonobstructing renal stones.  Atherosclerosis.  Cholelithiasis.  Hepatic and renal cysts.   Electronically Signed   By: Misty Stanley M.D.   On: 08/08/2014 14:33   Ct Abdomen Pelvis Wo Contrast  08/05/2014   CLINICAL DATA:  Right-sided low abdominal pain extending into the back. History of kidney stones. Difficulty urinating today.  EXAM: CT ABDOMEN AND PELVIS WITHOUT CONTRAST  TECHNIQUE: Multidetector CT imaging of the abdomen and pelvis was performed following the standard protocol without IV contrast.  COMPARISON:  Pelvic MRI 05/05/2013.  Abdominal pelvic CT 06/14/2012.  FINDINGS: Lower chest: There is streaky atelectasis or scarring in both lung bases, unchanged from the prior study.  Hepatobiliary: Multiple hepatic cysts are grossly unchanged. There is diffuse hepatic steatosis. No new or enlarging liver lesions are identified. There is a small calcified gallstone in the gallbladder neck. There is no gallbladder wall distention, wall thickening or surrounding inflammatory change. There is no biliary dilatation.  Pancreas: Unremarkable. No pancreatic ductal dilatation or surrounding inflammatory changes.  Spleen: Normal in size without focal abnormality.  Adrenals/Urinary Tract: Both adrenal glands appear normal.There is a 2 mm nonobstructing calculus in the interpolar region of the right kidney. There is a  tiny calculus in the lower pole of the left kidney. There is mild right-sided hydronephrosis and hydroureter. The right ureter is obstructed proximal to a 2 mm calculus on image 57. This is located at the L5 level. There is no left-sided hydronephrosis. The bladder is partly obscured by artifact from the bilateral total hip arthroplasties, but demonstrates no definite abnormality.  Stomach/Bowel: There are extensive diverticular changes throughout the descending and sigmoid colon without surrounding inflammatory change. There may be mild colonic wall thickening. The stomach and small bowel demonstrate no significant findings. The appendix appears normal. There is no extraluminal fluid collection.  Vascular/Lymphatic: Small lymph nodes within the porta hepatis are not pathologically enlarged. There is no retroperitoneal or pelvic lymphadenopathy. There  is diffuse atherosclerosis of the aorta, its branches and the iliac arteries. IVC filter noted.  Reproductive: Mild to moderate enlargement of the prostate gland, grossly stable.  Other: There are postsurgical changes within the anterior abdominal wall status post hernia repair. There is asymmetric fat within the right inguinal canal which appears unchanged.  Musculoskeletal: No acute or significant osseous findings. Previous bilateral total hip arthroplasty. Mild lumbar spondylosis noted.  IMPRESSION: 1. Obstructing 2 mm calculus in the mid right ureter. 2. Nonobstructing bilateral renal calculi. 3. Diffuse atherosclerosis. 4. Hepatic steatosis, hepatic cysts and cholelithiasis noted.   Electronically Signed   By: Camie Patience M.D.   On: 08/05/2014 14:43   Dg Abd Portable 1v  08/08/2014   CLINICAL DATA:  RIGHT flank pain since last Friday  EXAM: PORTABLE ABDOMEN - 1 VIEW  COMPARISON:  Portable exam 0953 hr without priors for comparison ; correlation made to CT abdomen and pelvis exam of 08/05/2014  FINDINGS: IVC filter noted.  BILATERAL hip prostheses.  Question  prior LEFT inguinal hernia repair.  Normal bowel gas pattern without bowel dilatation or wall thickening.  No urinary tract calcification identified.  Specifically, the 2 mm mid RIGHT ureteral calculus seen on prior CT exam is not identified.  No acute osseous findings.  IMPRESSION: No urinary tract calcification identified.  With a small size of the previously identified calculus, this could be present but radiographically occult; if there is persistent clinical concern for retained RIGHT ureteral calculus, recommend repeat CT imaging.   Electronically Signed   By: Lavonia Dana M.D.   On: 08/08/2014 10:14    Microbiology: No results found for this or any previous visit (from the past 240 hour(s)).   Labs: Basic Metabolic Panel:  Recent Labs Lab 08/05/14 1218 08/06/14 2212 08/07/14 0500 08/08/14 0854 08/09/14 0434  NA 139 134* 134* 137 137  K 4.3 4.3 4.0 3.9 3.3*  CL 102 96 99 101 108  CO2 25 24 21 22 23   GLUCOSE 105* 122* 104* 114* 128*  BUN 14 19 20 17 11   CREATININE 0.75 1.47* 1.43* 1.26 0.91  CALCIUM 9.6 9.4 8.6 8.2* 8.1*   Liver Function Tests:  Recent Labs Lab 08/05/14 1218 08/06/14 2212  AST 36 30  ALT 43 33  ALKPHOS 71 61  BILITOT 0.4 0.5  PROT 6.7 7.0  ALBUMIN 3.7 3.7   No results for input(s): LIPASE, AMYLASE in the last 168 hours. No results for input(s): AMMONIA in the last 168 hours. CBC:  Recent Labs Lab 08/05/14 1218 08/06/14 2212 08/07/14 0500 08/08/14 0854 08/09/14 0434  WBC 7.6 11.9* 8.3 9.3 5.8  NEUTROABS 4.5 9.3*  --   --   --   HGB 14.7 15.2 13.9 13.8 12.2*  HCT 43.5 43.7 41.1 39.7 36.1*  MCV 99.5 99.8 100.7* 99.5 100.0  PLT 163 155 140* 146* 133*   Cardiac Enzymes:  Recent Labs Lab 08/05/14 1357  TROPONINI <0.30   BNP: BNP (last 3 results) No results for input(s): PROBNP in the last 8760 hours. CBG: No results for input(s): GLUCAP in the last 168 hours.     SignedKelvin Cellar  [facility] 08/09/2014, 12:02  PM

## 2014-08-09 NOTE — Telephone Encounter (Signed)
Pharmacy faxed rf of ibuprofen 800mg .  This med is not on current med list.    Please advise rf.

## 2014-08-09 NOTE — Telephone Encounter (Signed)
I want to hold off on refilling this med b/c he just had an acute kidney injury from kidney stones and was just discharge from the hospital.  Taking ibuprofen right now could reinjure his kidney or inhibit proper kidney recovery from recent injury.  He is to get a BMET in 1 week and if his kidney function is at baseline and he is feeling well, then I will RF this med again.  Pls let pt know.--thx

## 2014-08-09 NOTE — Progress Notes (Signed)
Patient discharged to home with instructions. 

## 2014-08-09 NOTE — Progress Notes (Signed)
Patient ID: Bernard Byrd, male   DOB: 1931/03/09, 80 y.o.   MRN: [id number]    Subjective: Bernard Byrd is doing well this morning.   The CT yesterday showed that the stone was either at the UVJ or had passed and this morning Bernard Byrd thinks it has passed.  He has no further pain.  He didn't catch the stone.   He has no associate signs or symptoms. ROS:  Review of Systems  Constitutional: Negative for fever.  Gastrointestinal: Negative for nausea.    Anti-infectives: Anti-infectives    None      Current Facility-Administered Medications  Medication Dose Route Frequency Provider Last Rate Last Dose  . 0.9 %  sodium chloride infusion   Intravenous Continuous Debbe Odea, MD 125 mL/hr at 08/08/14 2308    . acetaminophen (TYLENOL) tablet 650 mg  650 mg Oral Q6H PRN Theressa Millard, MD       Or  . acetaminophen (TYLENOL) suppository 650 mg  650 mg Rectal Q6H PRN Theressa Millard, MD      . alum & mag hydroxide-simeth (MAALOX/MYLANTA) 200-200-20 MG/5ML suspension 30 mL  30 mL Oral Q6H PRN Theressa Millard, MD      . aspirin tablet 325 mg  325 mg Oral Daily Theressa Millard, MD   325 mg at 08/08/14 1059  . docusate sodium (COLACE) capsule 100 mg  100 mg Oral BID Theressa Millard, MD   100 mg at 08/08/14 2133  . enoxaparin (LOVENOX) injection 40 mg  40 mg Subcutaneous Q24H Debbe Odea, MD   40 mg at 08/08/14 1400  . feeding supplement (ENSURE COMPLETE) (ENSURE COMPLETE) liquid 237 mL  237 mL Oral TID PC & HS Debbe Odea, MD   237 mL at 08/08/14 2134  . hydrALAZINE (APRESOLINE) injection 10 mg  10 mg Intravenous Q6H PRN Theressa Millard, MD   10 mg at 08/08/14 0710  . HYDROmorphone (DILAUDID) injection 0.5-1 mg  0.5-1 mg Intravenous Q3H PRN Theressa Millard, MD   1 mg at 08/08/14 1533  . metoprolol (LOPRESSOR) tablet 50 mg  50 mg Oral BID Theressa Millard, MD   50 mg at 08/08/14 2134  . multivitamin with minerals tablet 1 tablet  1 tablet Oral Daily Theressa Millard, MD       . ondansetron [facility]) injection 4 mg  4 mg Intravenous Once Pamella Pert, MD   4 mg at 08/06/14 2255  . ondansetron (ZOFRAN) tablet 4 mg  4 mg Oral Q6H PRN Theressa Millard, MD       Or  . ondansetron (ZOFRAN) injection 4 mg  4 mg Intravenous Q6H PRN Theressa Millard, MD      . oxyCODONE (Oxy IR/ROXICODONE) immediate release tablet 5 mg  5 mg Oral Q4H PRN Theressa Millard, MD   5 mg at 08/08/14 1534  . polyethylene glycol (MIRALAX / GLYCOLAX) packet 17 g  17 g Oral Daily PRN Debbe Odea, MD      . rosuvastatin (CRESTOR) tablet 2.5 mg  2.5 mg Oral QHS Theressa Millard, MD   2.5 mg at 08/08/14 2134  . senna-docusate (Senokot-S) tablet 2 tablet  2 tablet Oral QHS Debbe Odea, MD   2 tablet at 08/08/14 2134  . sodium chloride (OCEAN) 0.65 % nasal spray 1 spray  1 spray Each Nare PRN Saima Rizwan, MD      . sodium chloride 0.9 % bolus 1,000 mL  1,000 mL Intravenous PRN Saima  Rizwan, MD      . tamsulosin (FLOMAX) capsule 0.4 mg  0.4 mg Oral Daily Debbe Odea, MD   0.4 mg at 08/08/14 1059     Objective: Vital signs in last 24 hours: Temp:  [97.7 F (36.5 C)-98.6 F (37 C)] 97.7 F (36.5 C) (12/22 0601) Pulse Rate:  [56-80] 56 (12/22 0601) Resp:  [16-18] 16 (12/22 0601) BP: (137-168)/(63-78) 137/63 mmHg (12/22 0601) SpO2:  [94 %-97 %] 95 % (12/22 0601)  Intake/Output from previous day: 12/21 0701 - 12/22 0700 In: 2619.6 [P.O.:680; I.V.:1939.6] Out: 1350 [Urine:1350] Intake/Output this shift:     Physical Exam  Constitutional: He is well-developed, well-nourished, and in no distress.  Abdominal: Soft. There is no tenderness. There is no guarding.  Vitals reviewed.   Lab Results:   Recent Labs  08/08/14 0854 08/09/14 0434  WBC 9.3 5.8  HGB 13.8 12.2*  HCT 39.7 36.1*  PLT 146* 133*   BMET  Recent Labs  08/08/14 0854 08/09/14 0434  NA 137 137  K 3.9 3.3*  CL 101 108  CO2 22 23  GLUCOSE 114* 128*  BUN 17 11  CREATININE 1.26 0.91  CALCIUM 8.2* 8.1*    PT/INR No results for input(s): LABPROT, INR in the last 72 hours. ABG No results for input(s): PHART, HCO3 in the last 72 hours.  Invalid input(s): PCO2, PO2  Studies/Results: Ct Abdomen Pelvis Wo Contrast  08/08/2014   CLINICAL DATA:  Subsequent encounter for 3 day history of right ureteral stone.  EXAM: CT ABDOMEN AND PELVIS WITHOUT CONTRAST  TECHNIQUE: Multidetector CT imaging of the abdomen and pelvis was performed following the standard protocol without IV contrast.  COMPARISON:  08/05/2014.  FINDINGS: Lower chest: Dependent atelectasis noted in the lower lobes bilaterally.  Hepatobiliary: Several hepatic cysts again noted, stable. Tiny calcified stone noted in the neck of the gallbladder. No intrahepatic or extrahepatic biliary dilation.  Pancreas: No focal mass lesion. No dilatation of the main duct. No intraparenchymal cyst. No peripancreatic edema.  Spleen: No splenomegaly. No focal mass lesion.  Adrenals/Urinary Tract: No adrenal nodule or mass. One-2 mm nonobstructing stone identified towards the lower pole the right kidney. Interval increase and right perinephric edema/fluid. Right ureter remains distended. Tiny stones seen in the mid right ureter on the previous study has migrated in the interval, but the distal right ureter and posterior bladder are obscured by streak artifact from the bilateral hip replacement. Stable tiny nonobstructing left renal stone. Water density lesions in the left kidney are likely related to cysts.  Stomach/Bowel: Small hiatal hernia noted. Stomach otherwise normal. Duodenum is normally positioned as is the ligament of Treitz. No small bowel wall thickening. No small bowel dilatation. Terminal ileum is normal. The appendix is normal. Diverticular changes are noted in the distal colon without diverticulitis.  Vascular/Lymphatic: Atherosclerotic calcification is noted in the wall of the abdominal aorta without aneurysm. IVC filter is evident in-situ no  gastrohepatic or hepatoduodenal ligament lymphadenopathy. No retroperitoneal lymphadenopathy. No pelvic sidewall lymphadenopathy.  Reproductive: Prostate gland not well seen secondary to streak artifact.  Other: No gross intraperitoneal free fluid. Right inguinal hernia contains only fat. Patient is probably status post left inguinal hernia repair.  Musculoskeletal: Status post bilateral hip replacement. Bone windows reveal no worrisome lytic or sclerotic osseous lesions.  IMPRESSION: Progressive right-sided perinephric and periureteric edema/fluid. Tiny stones seen in the right ureter on the previous study is not evident on the current exam although the distal right ureter and posterior bladder  obscured by streak artifact from the hip replacements.  Bilateral nonobstructing renal stones.  Atherosclerosis.  Cholelithiasis.  Hepatic and renal cysts.   Electronically Signed   By: Misty Stanley M.D.   On: 08/08/2014 14:33   Dg Abd Portable 1v  08/08/2014   CLINICAL DATA:  RIGHT flank pain since last Friday  EXAM: PORTABLE ABDOMEN - 1 VIEW  COMPARISON:  Portable exam 0953 hr without priors for comparison ; correlation made to CT abdomen and pelvis exam of 08/05/2014  FINDINGS: IVC filter noted.  BILATERAL hip prostheses.  Question prior LEFT inguinal hernia repair.  Normal bowel gas pattern without bowel dilatation or wall thickening.  No urinary tract calcification identified.  Specifically, the 2 mm mid RIGHT ureteral calculus seen on prior CT exam is not identified.  No acute osseous findings.  IMPRESSION: No urinary tract calcification identified.  With a small size of the previously identified calculus, this could be present but radiographically occult; if there is persistent clinical concern for retained RIGHT ureteral calculus, recommend repeat CT imaging.   Electronically Signed   By: Lavonia Dana M.D.   On: 08/08/2014 10:14   I have reviewed his recent labs and the CT films and report.    Assessment: He  is currently pain free and has likely passed the small right ureteral stone. His renal function has normalized.  Plan: He is a patient of Dr. Tresa Endo at [facility] and was encouraged to f/u with him in the next few weeks.   CC: Dr. Jori Moll L. Davis.     LOS: 3 days    Malka So 08/09/2014

## 2014-08-09 NOTE — Discharge Instructions (Signed)
Dietary Guidelines to Help Prevent Kidney Stones  Your risk of kidney stones can be decreased by adjusting the foods you eat. The most important thing you can do is drink enough fluid. You should drink enough fluid to keep your urine clear or pale yellow. The following guidelines provide specific information for the type of kidney stone you have had.  GUIDELINES ACCORDING TO TYPE OF KIDNEY STONE  Calcium Oxalate Kidney Stones  · Reduce the amount of salt you eat. Foods that have a lot of salt cause your body to release excess calcium into your urine. The excess calcium can combine with a substance called oxalate to form kidney stones.  · Reduce the amount of animal protein you eat if the amount you eat is excessive. Animal protein causes your body to release excess calcium into your urine. Ask your dietitian how much protein from animal sources you should be eating.  · Avoid foods that are high in oxalates. If you take vitamins, they should have less than 500 mg of vitamin C. Your body turns vitamin C into oxalates. You do not need to avoid fruits and vegetables high in vitamin C.  Calcium Phosphate Kidney Stones  · Reduce the amount of salt you eat to help prevent the release of excess calcium into your urine.  · Reduce the amount of animal protein you eat if the amount you eat is excessive. Animal protein causes your body to release excess calcium into your urine. Ask your dietitian how much protein from animal sources you should be eating.  · Get enough calcium from food or take a calcium supplement (ask your dietitian for recommendations). Food sources of calcium that do not increase your risk of kidney stones include:  ¨ Broccoli.  ¨ Dairy products, such as cheese and yogurt.  ¨ Pudding.  Uric Acid Kidney Stones  · Do not have more than 6 oz of animal protein per day.  FOOD SOURCES  Animal Protein Sources  · Meat (all types).  · Poultry.  · Eggs.  · Fish, seafood.  Foods High in Salt  · Salt seasonings.  · Soy  sauce.  · Teriyaki sauce.  · Cured and processed meats.  · Salted crackers and snack foods.  · Fast food.  · Canned soups and most canned foods.  Foods High in Oxalates  · Grains:  ¨ Amaranth.  ¨ Barley.  ¨ Grits.  ¨ Wheat germ.  ¨ Bran.  ¨ Buckwheat flour.  ¨ All bran cereals.  ¨ Pretzels.  ¨ Whole wheat bread.  · Vegetables:  ¨ Beans (wax).  ¨ Beets and beet greens.  ¨ Collard greens.  ¨ Eggplant.  ¨ Escarole.  ¨ Leeks.  ¨ Okra.  ¨ Parsley.  ¨ Rutabagas.  ¨ Spinach.  ¨ Swiss chard.  ¨ Tomato paste.  ¨ Fried potatoes.  ¨ Sweet potatoes.  · Fruits:  ¨ Red currants.  ¨ Figs.  ¨ Kiwi.  ¨ Rhubarb.  · Meat and Other Protein Sources:  ¨ Beans (dried).  ¨ Soy burgers and other soybean products.  ¨ Miso.  ¨ Nuts (peanuts, almonds, pecans, cashews, hazelnuts).  ¨ Nut butters.  ¨ Sesame seeds and tahini (paste made of sesame seeds).  ¨ Poppy seeds.  · Beverages:  ¨ Chocolate drink mixes.  ¨ Soy milk.  ¨ Instant iced tea.  ¨ Juices made from high-oxalate fruits or vegetables.  · Other:  ¨ Carob.  ¨ Chocolate.  ¨ Fruitcake.  ¨ Marmalades.  Document Released:   11/30/2010 Document Revised: 08/10/2013 Document Reviewed: 07/02/2013  Exit[employer]® Patient Information ©2015 [employer]. This information is not intended to replace advice given to you by your health care provider. Make sure you discuss any questions you have with your health care provider.

## 2014-08-30 ENCOUNTER — Other Ambulatory Visit: Payer: Self-pay

## 2014-08-30 DIAGNOSIS — C44629 Squamous cell carcinoma of skin of left upper limb, including shoulder: Secondary | ICD-10-CM | POA: Diagnosis not present

## 2014-08-30 MED ORDER — METOPROLOL TARTRATE 50 MG PO TABS: 50.0000 mg | ORAL_TABLET | Freq: Two times a day (BID) | ORAL | Status: DC

## 2014-08-31 DIAGNOSIS — N200 Calculus of kidney: Secondary | ICD-10-CM | POA: Diagnosis not present

## 2014-08-31 DIAGNOSIS — N400 Enlarged prostate without lower urinary tract symptoms: Secondary | ICD-10-CM | POA: Diagnosis not present

## 2014-09-01 DIAGNOSIS — L905 Scar conditions and fibrosis of skin: Secondary | ICD-10-CM | POA: Diagnosis not present

## 2014-09-16 ENCOUNTER — Encounter: Payer: Medicare Other | Admitting: Family Medicine

## 2014-11-07 ENCOUNTER — Telehealth: Payer: Self-pay | Admitting: Family Medicine

## 2014-11-07 NOTE — Telephone Encounter (Signed)
Pt requesting rf of ibuprofen 800mg .  Please advise.

## 2014-11-08 ENCOUNTER — Telehealth: Payer: Self-pay | Admitting: *Deleted

## 2014-11-08 MED ORDER — IBUPROFEN 800 MG PO TABS: 800.0000 mg | ORAL_TABLET | Freq: Three times a day (TID) | ORAL | Status: AC | PRN

## 2014-11-08 NOTE — Telephone Encounter (Signed)
[facility] sent fax requesting refill for Ibuprofen 800 mg tab #270. This med is no longer in pt's med list. Please advise refill?

## 2014-11-08 NOTE — Telephone Encounter (Signed)
OK to refill ibuprofen 800 mg tabs as previously prescribed with six additional refills.

## 2014-11-09 NOTE — Telephone Encounter (Signed)
May refill ibuprofen 800 mg as previously prescribed for a 90 day supply with with three additional refills. You should be able to find this old prescription in his medication section under history. Thanks

## 2014-11-09 NOTE — Telephone Encounter (Signed)
Rx sent 

## 2014-11-22 ENCOUNTER — Encounter: Payer: Self-pay | Admitting: Nurse Practitioner

## 2014-11-22 ENCOUNTER — Other Ambulatory Visit: Payer: Self-pay | Admitting: Cardiology

## 2014-11-22 ENCOUNTER — Ambulatory Visit (INDEPENDENT_AMBULATORY_CARE_PROVIDER_SITE_OTHER): Payer: Medicare Other | Admitting: Nurse Practitioner

## 2014-11-22 VITALS — BP 109/73 | HR 60 | Temp 98.3°F | Ht 71.0 in | Wt 221.0 lb

## 2014-11-22 DIAGNOSIS — J069 Acute upper respiratory infection, unspecified: Secondary | ICD-10-CM | POA: Diagnosis not present

## 2014-11-22 MED ORDER — AZITHROMYCIN 250 MG PO TABS: ORAL_TABLET | ORAL | Status: DC

## 2014-11-22 NOTE — Telephone Encounter (Signed)
Rx refill sent to patient pharmacy   

## 2014-11-22 NOTE — Progress Notes (Signed)
Pre visit review using our clinic review tool, if applicable. No additional management support is needed unless otherwise documented below in the visit note. 

## 2014-11-22 NOTE — Progress Notes (Signed)
   Subjective:    Patient ID: Bernard Byrd, male    DOB: 1930-12-14, 81 y.o.   MRN: [id number]  HPI Comments: Accompanied by wife.  Cough This is a new problem. The current episode started in the past 7 days. The problem has been gradually worsening. The problem occurs every few hours. The cough is productive of sputum. Associated symptoms include a fever, nasal congestion and a sore throat. Pertinent negatives include no chest pain, chills, ear pain, headaches, shortness of breath or wheezing. Nothing aggravates the symptoms. Treatments tried: mucinex, delsym. The treatment provided no relief. His past medical history is significant for bronchitis.      Review of Systems  Constitutional: Positive for fever. Negative for chills.  HENT: Positive for sore throat. Negative for ear pain.   Respiratory: Positive for cough. Negative for shortness of breath and wheezing.   Cardiovascular: Negative for chest pain.  Neurological: Negative for headaches.       Objective:   Physical Exam  Constitutional: He is oriented to person, place, and time. He appears well-developed and well-nourished. No distress.  HENT:  Head: Normocephalic and atraumatic.  Mouth/Throat: Oropharynx is clear and moist. No oropharyngeal exudate.  Bilateral ceruminosis, unable to visualize TM  Eyes: Conjunctivae are normal. Right eye exhibits no discharge. Left eye exhibits no discharge.  wearing glasses  Neck: Normal range of motion. Neck supple. No thyromegaly present.  Cardiovascular: Normal rate, regular rhythm and normal heart sounds.   No murmur heard. Pulmonary/Chest: Effort normal and breath sounds normal.  Lymphadenopathy:    He has no cervical adenopathy.  Neurological: He is alert and oriented to person, place, and time.  Skin: Skin is warm and dry.  Psychiatric: He has a normal mood and affect. His behavior is normal. Thought content normal.  Vitals reviewed.         Assessment & Plan:  1. Upper  respiratory infection with cough and congestion Continue mucinex, fluids, rest - azithromycin (ZITHROMAX) 250 MG tablet; Take 2T PO day 1, then 1T PO days 2-5  Dispense: 6 tablet; Refill: 0 See pt instructions F/u PRN

## 2014-11-22 NOTE — Patient Instructions (Signed)
Start antibiotic. Continue mucinex 600 mg twice daily. Sip fluids every hour.  Use cough syrup at night if cough keeps you awake. Do not take during day.  Let us know if not feeling better.

## 2014-11-25 ENCOUNTER — Other Ambulatory Visit: Payer: Self-pay | Admitting: Cardiology

## 2014-11-28 ENCOUNTER — Telehealth: Payer: Self-pay | Admitting: Family Medicine

## 2014-11-28 NOTE — Telephone Encounter (Signed)
Patient is no better & is coughing up green phlegm. Patient requests Zpack to be sent to [facility].

## 2014-11-28 NOTE — Telephone Encounter (Signed)
Called and spoke with wife Bernard Byrd. States that he is still coughing and getting green phlegm up. He has already taken the Zpack that Layne prescribed on 11/22/14. I told them that if he truly wasn't doing better then he needed to be seen, but Yvone Neu did not want to be seen. I told them that it could be viral which is why the abx is not working and that a viral illness can take 14 days to start feeling better. Patient will CB as needed.

## 2014-12-12 ENCOUNTER — Encounter: Payer: Self-pay | Admitting: Family Medicine

## 2014-12-12 ENCOUNTER — Telehealth: Payer: Self-pay | Admitting: Family Medicine

## 2014-12-12 DIAGNOSIS — Z96649 Presence of unspecified artificial hip joint: Secondary | ICD-10-CM | POA: Insufficient documentation

## 2014-12-12 NOTE — Telephone Encounter (Signed)
Patient's wife aware.  Rx was sent.

## 2014-12-12 NOTE — Telephone Encounter (Signed)
OK, rx written. Pls warn pt that sometimes writing the prescription for this sort of equipment is just the beginning.  They may require him to get an office visit with a detailed history and exam that provides justification for the equipment, and even then it may take a waiting period before things are approved.  Just tell him this is an FYI.-thx

## 2014-12-12 NOTE — Telephone Encounter (Signed)
Patient cannot get up & down the steps in his home due to hip replacement. Please write Rx for [employer] to be installed in their home. The fax # to send it to is [phone] use customer # [phone].

## 2014-12-12 NOTE — Telephone Encounter (Signed)
Please advise 

## 2014-12-21 ENCOUNTER — Ambulatory Visit (INDEPENDENT_AMBULATORY_CARE_PROVIDER_SITE_OTHER): Payer: Medicare Other | Admitting: Cardiology

## 2014-12-21 ENCOUNTER — Encounter: Payer: Self-pay | Admitting: Cardiology

## 2014-12-21 ENCOUNTER — Encounter: Payer: Self-pay | Admitting: *Deleted

## 2014-12-21 VITALS — BP 148/92 | HR 60 | Ht 71.0 in | Wt 221.1 lb

## 2014-12-21 DIAGNOSIS — E7800 Pure hypercholesterolemia, unspecified: Secondary | ICD-10-CM

## 2014-12-21 DIAGNOSIS — I10 Essential (primary) hypertension: Secondary | ICD-10-CM

## 2014-12-21 DIAGNOSIS — I25810 Atherosclerosis of coronary artery bypass graft(s) without angina pectoris: Secondary | ICD-10-CM

## 2014-12-21 DIAGNOSIS — I2583 Coronary atherosclerosis due to lipid rich plaque: Secondary | ICD-10-CM

## 2014-12-21 DIAGNOSIS — I2510 Atherosclerotic heart disease of native coronary artery without angina pectoris: Secondary | ICD-10-CM | POA: Diagnosis not present

## 2014-12-21 NOTE — Assessment & Plan Note (Signed)
Continue aspirin and statin. Recent vague left upper extremity pain. Schedule nuclear study for risk stratification.

## 2014-12-21 NOTE — Assessment & Plan Note (Signed)
Blood pressure is mildly elevated. Continue metoprolol. I have asked him to follow his blood pressure at home. If it runs high we will add additional medications.

## 2014-12-21 NOTE — Patient Instructions (Signed)
Your physician wants you to follow-up in: ONE YEAR WITH DR CRENSHAW You will receive a reminder letter in the mail two months in advance. If you don't receive a letter, please call our office to schedule the follow-up appointment.   Your physician has requested that you have a lexiscan myoview. For further information please visit [email or web address]. Please follow instruction sheet, as given.   

## 2014-12-21 NOTE — Progress Notes (Signed)
HPI: FU CAD. H/o inferior MI s/p CABG x 5 in 2/08 w/ ? CVA post CABG. ABIs in August of 2010 were normal. Last nuclear study in April of 2013 showed an ejection fraction of 55%. There was a prior inferior basal infarct but no ischemia. Abdominal CT December 2015 showed no aneurysm. Since I last saw him, he denies dyspnea on exertion, orthopnea, PND or pedal edema. Several weeks ago he had left upper extremity pain transiently of unclear reasons. He fell recently but denies syncope.  Current Outpatient Prescriptions  Medication Sig Dispense Refill  . aspirin 325 MG tablet Take 325 mg by mouth daily.      . AZITHROMYCIN PO Take by mouth.    . diphenhydrAMINE (BENADRYL) 12.5 MG/5ML liquid Take 25 mg by mouth daily as needed for itching.    . docusate sodium (COLACE) 50 MG capsule Take by mouth 2 (two) times daily.    Marland Kitchen guaifenesin (ROBITUSSIN) 100 MG/5ML syrup Take 200 mg by mouth 3 (three) times daily as needed for cough.    . metoprolol (LOPRESSOR) 50 MG tablet TAKE 1 TABLET (50 MG TOTAL) BY MOUTH 2 (TWO) TIMES DAILY. 60 tablet 0  . Multiple Vitamin (MULTIVITAMIN) tablet Take 1 tablet by mouth daily.      . rosuvastatin (CRESTOR) 5 MG tablet Take 1/2 tab daily (Patient taking differently: Take 2.5 mg by mouth at bedtime. ) 30 tablet 12  . [DISCONTINUED] fexofenadine (ALLEGRA) 180 MG tablet Take 1 tablet (180 mg total) by mouth daily. 30 tablet 11  . [DISCONTINUED] furosemide (LASIX) 20 MG tablet Take 20 mg by mouth as needed.       No current facility-administered medications for this visit.     Past Medical History  Diagnosis Date  . DVT (deep venous thrombosis) 2009    Post-op from laparoscopic inguinal hernia repair  . CAD (coronary artery disease)     CABG 2008:  myoview  2010 showed no ischemia and a normal EF  . Nephrolithiasis   . Hyperlipidemia   . Hypertension   . Prostate nodule     multiple benign biopsies (Dr Rosana Hoes)  . MI (myocardial infarction) 10/14/2006   Inferior.    . Osteoarthritis     Primarily hips  . Melanoma     skin graft surgery required  . Groin pain, left lower quadrant     Chronic, intermittent: believed to be due to scarring around MESH placed at inguinal hernia repair  . CVA (cerebral vascular accident)     possible/no residual deficit--following CABG   . Retroperitoneal hematoma 2009    on lovenox; greenfield filter placed after this  . Diverticulosis of rectosigmoid     Noted on CT 04/2011 done to eval chronic groin pain  . MELANOMA, HX OF 12/02/2008  . Nuclear sclerotic cataract of both eyes 2015    Past Surgical History  Procedure Laterality Date  . Rotator cuff repair    . Status post coronary bypass graft  10/14/06     (LIMA to the LAD, saphenous vein graft to the diagonal, saphenous vein graft to the mid obtuse marginal, saphenous vein graft placed in a sequential fashion ot the RV branch of the right coronary artery and distal right coronary artery  . Carpal tunnel repair b    . Trigger finger x 3    . Kidney stone removal  01/18/88  . Melanoma excision  08/1990    left shoulder - skin graft from leg  .  Hernia repair  05/20/2008    open, X 3  . Colonoscopy w/ polypectomy  02/07/09    Adenomatous polyps, severe diverticulosis, internal hemorrhoids.  Repeat 2015  . Greenfield filter    . Bilateral anterior total hip arthroplasty      One in 2004 and one in 2011    History   Social History  . Marital Status: Married    Spouse Name: N/A  . Number of Children: N/A  . Years of Education: N/A   Occupational History  . Not on file.   Social History Main Topics  . Smoking status: Never Smoker   . Smokeless tobacco: Never Used  . Alcohol Use: No  . Drug Use: No  . Sexual Activity: Not on file   Other Topics Concern  . 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   No T/A/D's.    ROS: hip arthralgias but no fevers  or chills, productive cough, hemoptysis, dysphasia, odynophagia, melena, hematochezia, dysuria, hematuria, rash, seizure activity, orthopnea, PND, pedal edema, claudication. Remaining systems are negative.  Physical Exam: Well-developed well-nourished in no acute distress.  Skin is warm and dry.  HEENT is normal.  Neck is supple.  Chest is clear to auscultation with normal expansion.  Cardiovascular exam is regular rate and rhythm.  Abdominal exam nontender or distended. No masses palpated. Extremities show no edema. neuro grossly intact  ECG sinus rhythm at a rate of 61. Nonspecific ST changes.

## 2014-12-21 NOTE — Assessment & Plan Note (Signed)
Continue statin. 

## 2014-12-25 ENCOUNTER — Other Ambulatory Visit: Payer: Self-pay | Admitting: Cardiology

## 2015-01-02 ENCOUNTER — Encounter: Payer: Self-pay | Admitting: Cardiology

## 2015-01-02 ENCOUNTER — Telehealth: Payer: Self-pay | Admitting: Cardiology

## 2015-01-02 MED ORDER — AMLODIPINE BESYLATE 5 MG PO TABS: 5.0000 mg | ORAL_TABLET | Freq: Every day | ORAL | Status: DC

## 2015-01-02 NOTE — Telephone Encounter (Signed)
Bernard Byrd was returning Bernard Byrd's call in regards to the pt's blood pressure medication. Please call  Thanks

## 2015-01-02 NOTE — Telephone Encounter (Signed)
Spoke with pt wife, Aware of dr crenshaw's recommendations.  

## 2015-01-02 NOTE — Telephone Encounter (Signed)
Spoke to patient. Had been having dizziness last time seen in office (12/21/14) Wanted to report BPs to Dr. Stanford Breed.  BPs [id number] systolic over past week or so. Last 3 days:  185/92 204/104 (had dizziness & trouble walking w/ this) 192/109   HR 60  Reports pulse typically 60-65. Taking Metoprolol 50mg  BID. None/mild symptoms this AM.  Informed pt I would send to Dr. Stanford Breed for recommendation.

## 2015-01-02 NOTE — Telephone Encounter (Signed)
Add norvasc 5 mg daily and follow BP Kirk Ruths

## 2015-01-02 NOTE — Telephone Encounter (Signed)
Pt's wife called is stating that his BP has been running high since last Wednsday and was instructed to contact [facility]. She states that this morning his BP read 192/109. Please call  Thanks

## 2015-01-02 NOTE — Telephone Encounter (Signed)
This encounter was created in error - please disregard.

## 2015-01-06 ENCOUNTER — Telehealth (HOSPITAL_COMMUNITY): Payer: Self-pay | Admitting: *Deleted

## 2015-01-06 NOTE — Telephone Encounter (Signed)
Patient given detailed instructions per Myocardial Perfusion Study Information Sheet for test on 01/11/15 at 0915 Patient verbalized understanding. Bernard Byrd, [name]

## 2015-01-11 ENCOUNTER — Ambulatory Visit (HOSPITAL_COMMUNITY)
Admission: RE | Admit: 2015-01-11 | Discharge: 2015-01-11 | Disposition: A | Discharge status: Home / Self Care | Payer: Medicare Other | Source: Ambulatory Visit | Attending: Cardiology | Admitting: Cardiology

## 2015-01-11 ENCOUNTER — Telehealth (HOSPITAL_COMMUNITY): Payer: Self-pay | Admitting: *Deleted

## 2015-01-11 DIAGNOSIS — I25810 Atherosclerosis of coronary artery bypass graft(s) without angina pectoris: Secondary | ICD-10-CM

## 2015-01-11 NOTE — Op Note (Signed)
Pt came in today to get NUC MPI completed. Pt is s/p CABG in 2013. I attempted IV x2 on pt. I was able to get ample blood flow on both IV's. The first IV was in the pt's right hand. While I did get great blood flow, the IV site began to swell when I began to further flush the site. I then attempted an IV on the pt's right lower forearm. I did get ample blood flow from same, however once again the IV site began to swell after flushing with NS. I did look for an alternative site. I did attempt pt's left forearm and was able to place IV. IV site began to swell after a 5cc flush of normal saline. I did remove IV again. I did speak with the pt about possibly rescheduling his test for the hospital where a venoscope would be available. Pt did verbalize his desire for another attempt or just not do the test. I did confirm with pt the desire for me to attempt one last time. I did get another IV placed in the proximal right forearm. The pt's IV had great blood flow at first and then began to swell after flushing completed. I did remove the IV and pt was consulted with Imagene Riches and myself about rescheduling his testing at the hospital. Pt stated he would reschedule his test and was encouraged to follow through per doctors orders. Pt was escorted to [facility] at the scheduling desk for reschedule at the hospital.

## 2015-01-13 ENCOUNTER — Telehealth: Payer: Self-pay | Admitting: *Deleted

## 2015-01-13 NOTE — Telephone Encounter (Signed)
Patient dropped off BP readings ranging from 131/79 to 192/109.  Amlodipine 5mg  daily started 01/02/15.  Will send this info to Dr. Stanford Breed for review and advise.  Patient/wife notified we will contact him early next week with Dr. Jacalyn Lefevre advise.

## 2015-01-14 NOTE — Telephone Encounter (Signed)
If average BP remains high despite amlodipine 5 mg daily, increase to 10 mg daily and follow. Kirk Ruths

## 2015-01-17 NOTE — Telephone Encounter (Signed)
Spoke with pt wife, Aware of dr Jacalyn Lefevre recommendations.  They are going to continue to monitor bp and call if concerned. The patient does not want to have the nuclear stress test, he is aware of the reasons for the testing. Patient is aware of the danger of undiagnosed perfusion problems.

## 2015-03-10 DIAGNOSIS — Q6100 Congenital renal cyst, unspecified: Secondary | ICD-10-CM | POA: Diagnosis not present

## 2015-03-10 DIAGNOSIS — N401 Enlarged prostate with lower urinary tract symptoms: Secondary | ICD-10-CM | POA: Diagnosis not present

## 2015-03-10 DIAGNOSIS — N200 Calculus of kidney: Secondary | ICD-10-CM | POA: Diagnosis not present

## 2015-05-17 DIAGNOSIS — D1800 Hemangioma unspecified site: Secondary | ICD-10-CM | POA: Diagnosis not present

## 2015-05-17 DIAGNOSIS — Z85828 Personal history of other malignant neoplasm of skin: Secondary | ICD-10-CM | POA: Diagnosis not present

## 2015-05-17 DIAGNOSIS — L814 Other melanin hyperpigmentation: Secondary | ICD-10-CM | POA: Diagnosis not present

## 2015-05-17 DIAGNOSIS — L821 Other seborrheic keratosis: Secondary | ICD-10-CM | POA: Diagnosis not present

## 2015-05-17 DIAGNOSIS — Z85820 Personal history of malignant melanoma of skin: Secondary | ICD-10-CM | POA: Diagnosis not present

## 2015-05-17 DIAGNOSIS — L570 Actinic keratosis: Secondary | ICD-10-CM | POA: Diagnosis not present

## 2015-05-17 DIAGNOSIS — D225 Melanocytic nevi of trunk: Secondary | ICD-10-CM | POA: Diagnosis not present

## 2015-05-31 ENCOUNTER — Ambulatory Visit (INDEPENDENT_AMBULATORY_CARE_PROVIDER_SITE_OTHER): Payer: Medicare Other | Admitting: Family Medicine

## 2015-05-31 ENCOUNTER — Encounter: Payer: Self-pay | Admitting: Family Medicine

## 2015-05-31 VITALS — BP 120/79 | HR 61 | Temp 98.0°F | Resp 16 | Ht 70.5 in | Wt 226.0 lb

## 2015-05-31 DIAGNOSIS — E785 Hyperlipidemia, unspecified: Secondary | ICD-10-CM | POA: Diagnosis not present

## 2015-05-31 DIAGNOSIS — R490 Dysphonia: Secondary | ICD-10-CM

## 2015-05-31 DIAGNOSIS — Z23 Encounter for immunization: Secondary | ICD-10-CM

## 2015-05-31 DIAGNOSIS — Z0000 Encounter for general adult medical examination without abnormal findings: Secondary | ICD-10-CM

## 2015-05-31 DIAGNOSIS — R198 Other specified symptoms and signs involving the digestive system and abdomen: Secondary | ICD-10-CM | POA: Diagnosis not present

## 2015-05-31 DIAGNOSIS — R0989 Other specified symptoms and signs involving the circulatory and respiratory systems: Secondary | ICD-10-CM

## 2015-05-31 DIAGNOSIS — I10 Essential (primary) hypertension: Secondary | ICD-10-CM | POA: Diagnosis not present

## 2015-05-31 DIAGNOSIS — R6889 Other general symptoms and signs: Secondary | ICD-10-CM

## 2015-05-31 LAB — CBC WITH DIFFERENTIAL/PLATELET
BASOS PCT: 0.6 % (ref 0.0–3.0)
Basophils Absolute: 0 10*3/uL (ref 0.0–0.1)
Eosinophils Absolute: 0.2 10*3/uL (ref 0.0–0.7)
Eosinophils Relative: 2.1 % (ref 0.0–5.0)
HCT: 46.7 % (ref 39.0–52.0)
Hemoglobin: 15.4 g/dL (ref 13.0–17.0)
LYMPHS ABS: 2.5 10*3/uL (ref 0.7–4.0)
Lymphocytes Relative: 31.5 % (ref 12.0–46.0)
MCHC: 32.9 g/dL (ref 30.0–36.0)
MCV: 104.3 fl — ABNORMAL HIGH (ref 78.0–100.0)
MONO ABS: 0.7 10*3/uL (ref 0.1–1.0)
Monocytes Relative: 9 % (ref 3.0–12.0)
NEUTROS PCT: 56.8 % (ref 43.0–77.0)
Neutro Abs: 4.5 10*3/uL (ref 1.4–7.7)
PLATELETS: 192 10*3/uL (ref 150.0–400.0)
RBC: 4.48 Mil/uL (ref 4.22–5.81)
RDW: 14.4 % (ref 11.5–15.5)
WBC: 7.9 10*3/uL (ref 4.0–10.5)

## 2015-05-31 LAB — COMPREHENSIVE METABOLIC PANEL
ALT: 52 U/L (ref 0–53)
AST: 38 U/L — ABNORMAL HIGH (ref 0–37)
Albumin: 4.2 g/dL (ref 3.5–5.2)
Alkaline Phosphatase: 69 U/L (ref 39–117)
BUN: 11 mg/dL (ref 6–23)
CO2: 25 mEq/L (ref 19–32)
Calcium: 9.4 mg/dL (ref 8.4–10.5)
Chloride: 105 mEq/L (ref 96–112)
Creatinine, Ser: 0.78 mg/dL (ref 0.40–1.50)
GFR: 101.22 mL/min (ref >60.00)
Glucose, Bld: 82 mg/dL (ref 70–99)
Potassium: 4.3 mEq/L (ref 3.5–5.1)
Sodium: 140 mEq/L (ref 135–145)
Total Bilirubin: 0.6 mg/dL (ref 0.2–1.2)
Total Protein: 6.8 g/dL (ref 6.0–8.3)

## 2015-05-31 LAB — LIPID PANEL
Cholesterol: 148 mg/dL (ref 0–200)
HDL: 48.7 mg/dL (ref >39.00)
LDL Cholesterol: 66 mg/dL (ref 0–99)
NonHDL: 99.01
Total CHOL/HDL Ratio: 3
Triglycerides: 167 mg/dL — ABNORMAL HIGH (ref 0.0–149.0)
VLDL: 33.4 mg/dL (ref 0.0–40.0)

## 2015-05-31 NOTE — Progress Notes (Signed)
Office Note 05/31/2015  CC:  Chief Complaint  Patient presents with  . Annual Exam    Pt is fasting.    HPI:  Bernard Byrd is a 81 y.o. White male who is here for annual health maintenance exam. Monitors bp at home: all normal. Has excess throat secretions for which daily PPI x 2 mo has not helped in the past.  Says he has never tried an antihistamine.  When I suggested this treatment today he asked if there was a way he could get a scan or get someone to look into his throat with a camera.  I told him ENT possibly could further evaluate this problem in this way and he said he would prefer ENT eval.  No other complaints.    Past Medical History  Diagnosis Date  . DVT (deep venous thrombosis) ([place]) 2009    Post-op from laparoscopic inguinal hernia repair  . CAD (coronary artery disease)     CABG 2008:  myoview  2010 showed no ischemia and a normal EF.  Nuclear study 11/2011: EF 55%. There was a prior inferior basal infarct but no ischemia.  . Nephrolithiasis   . Hyperlipidemia   . Hypertension   . Prostate nodule     multiple benign biopsies (Dr Rosana Hoes)  . MI (myocardial infarction) ([place]) 10/14/2006    Inferior.    . Osteoarthritis     Primarily hips  . Melanoma ([place])     skin graft surgery required  . Groin pain, left lower quadrant     Chronic, intermittent: believed to be due to scarring around MESH placed at inguinal hernia repair  . CVA (cerebral vascular accident) ([place])     possible/no residual deficit--following CABG   . Retroperitoneal hematoma 2009    on lovenox; greenfield filter placed after this  . Diverticulosis of rectosigmoid     Noted on CT 04/2011 done to eval chronic groin pain  . MELANOMA, HX OF 12/02/2008  . Nuclear sclerotic cataract of both eyes 2015    Past Surgical History  Procedure Laterality Date  . Rotator cuff repair    . Status post coronary bypass graft  10/14/06     (LIMA to the LAD, saphenous vein graft to the diagonal, saphenous  vein graft to the mid obtuse marginal, saphenous vein graft placed in a sequential fashion ot the RV branch of the right coronary artery and distal right coronary artery  . Carpal tunnel repair b    . Trigger finger x 3    . Kidney stone removal  01/18/88  . Melanoma excision  08/1990    left shoulder - skin graft from leg  . Hernia repair  05/20/2008    open, X 3  . Colonoscopy w/ polypectomy  02/07/09    Adenomatous polyps, severe diverticulosis, internal hemorrhoids.  Repeat 2015  . Greenfield filter    . Bilateral anterior total hip arthroplasty      One in 2004 and one in 2011  . Cardiovascular stress test  11/2011    Nuclear study: EF 55%. There was a prior inferior basal infarct but no ischemia    Family History  Problem Relation Age of Onset  . Cancer Mother 84    "male"  . Heart attack Father 44  . Heart disease Father   . Heart disease Brother     Social History   Social History  . Marital Status: Married    Spouse Name: N/A  . Number of Children: N/A  .  Years of Education: N/A   Occupational History  . Not on file.   Social History Main Topics  . Smoking status: Never Smoker   . Smokeless tobacco: Never Used  . Alcohol Use: No  . Drug Use: No  . Sexual Activity: Not on file   Other Topics Concern  . 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   No T/A/D's.    Outpatient Prescriptions Prior to Visit  Medication Sig Dispense Refill  . amLODipine (NORVASC) 5 MG tablet Take 1 tablet (5 mg total) by mouth daily. 30 tablet 11  . aspirin 325 MG tablet Take 325 mg by mouth daily.      . AZITHROMYCIN PO Take by mouth.    . docusate sodium (COLACE) 50 MG capsule Take by mouth 2 (two) times daily.    . metoprolol (LOPRESSOR) 50 MG tablet TAKE 1 TABLET (50 MG TOTAL) BY MOUTH 2 (TWO) TIMES DAILY. 60 tablet 11  . Multiple Vitamin (MULTIVITAMIN) tablet Take 1 tablet by mouth  daily.      . rosuvastatin (CRESTOR) 5 MG tablet Take 1/2 tab daily (Patient taking differently: Take 2.5 mg by mouth at bedtime. ) 30 tablet 12  . diphenhydrAMINE (BENADRYL) 12.5 MG/5ML liquid Take 25 mg by mouth daily as needed for itching.    Marland Kitchen guaifenesin (ROBITUSSIN) 100 MG/5ML syrup Take 200 mg by mouth 3 (three) times daily as needed for cough.     No facility-administered medications prior to visit.    Allergies  Allergen Reactions  . Clindamycin/Lincomycin Itching    Difficulty breathing  . Penicillins Hives and Rash  . Sulfonamide Derivatives Hives and Rash    ROS Review of Systems  Constitutional: Negative for fever, chills, appetite change and fatigue.  HENT: Positive for postnasal drip and voice change (mild hoarseness). Negative for congestion, dental problem, ear pain, sore throat and trouble swallowing.   Eyes: Negative for discharge, redness and visual disturbance.  Respiratory: Negative for cough, chest tightness, shortness of breath and wheezing.   Cardiovascular: Negative for chest pain, palpitations and leg swelling.  Gastrointestinal: Negative for nausea, vomiting, abdominal pain, diarrhea and blood in stool.  Genitourinary: Negative for dysuria, urgency, frequency, hematuria, flank pain and difficulty urinating.  Musculoskeletal: Negative for myalgias, back pain, joint swelling, arthralgias and neck stiffness.  Skin: Negative for pallor and rash.  Neurological: Negative for dizziness, speech difficulty, weakness and headaches.  Hematological: Negative for adenopathy. Does not bruise/bleed easily.  Psychiatric/Behavioral: Negative for confusion and sleep disturbance. The patient is not nervous/anxious.     PE; Blood pressure 120/79, pulse 61, temperature 98 F (36.7 C), temperature source Oral, resp. rate 16, height 5' 10.5" (1.791 m), weight 226 lb (102.513 kg), SpO2 95 %. Gen: Alert, well appearing.  Patient is oriented to person, place, time, and  situation. AFFECT: pleasant, lucid thought and speech. ENT: Ears: EACs clear, normal epithelium.  TMs with good light reflex and landmarks bilaterally.  Eyes: no injection, icteris, swelling, or exudate.  EOMI, PERRLA. Nose: no drainage or turbinate edema/swelling.  No injection or focal lesion.  Mouth: lips without lesion/swelling.  Oral mucosa pink and moist.  Dentition intact and without obvious caries or gingival swelling.  Oropharynx without erythema, exudate, or swelling.  Neck: supple/nontender.  No LAD, mass, or TM.  Carotid pulses 2+ bilaterally, without bruits. CV: RRR, no m/r/g.  LUNGS: CTA bilat, nonlabored resps, good aeration in all lung fields. ABD: soft, NT, ND, BS normal.  No hepatospenomegaly or mass.  No bruits. EXT: no clubbing, cyanosis, or edema.  Musculoskeletal: no joint swelling, erythema, warmth, or tenderness.  ROM of all joints intact. Skin - no sores or suspicious lesions or rashes or color changes  Pertinent labs:  Lab Results  Component Value Date   TSH 1.77 09/15/2013   Lab Results  Component Value Date   WBC 5.8 08/09/2014   HGB 12.2* 08/09/2014   HCT 36.1* 08/09/2014   MCV 100.0 08/09/2014   PLT 133* 08/09/2014   Lab Results  Component Value Date   CREATININE 0.91 08/09/2014   BUN 11 08/09/2014   NA 137 08/09/2014   K 3.3* 08/09/2014   CL 108 08/09/2014   CO2 23 08/09/2014   Lab Results  Component Value Date   ALT 33 08/06/2014   AST 30 08/06/2014   ALKPHOS 61 08/06/2014   BILITOT 0.5 08/06/2014   Lab Results  Component Value Date   CHOL 144 09/15/2013   Lab Results  Component Value Date   HDL 39.50 09/15/2013   Lab Results  Component Value Date   LDLCALC 70 09/15/2013   Lab Results  Component Value Date   TRIG 172.0* 09/15/2013   Lab Results  Component Value Date   CHOLHDL 4 09/15/2013   ASSESSMENT AND PLAN:   1) Excessive throat clearing, along with mild hoarseness--chronic. Patient requested ENT referral so I have  ordered this today. He declined trial of allegra at this time and says a 48mo trial of daily PPI did not change anything.  2) Health maintenance exam: Reviewed age and gender appropriate health maintenance issues (prudent diet, regular exercise, health risks of tobacco and excessive alcohol, use of seatbelts, fire alarms in home, use of sunscreen).  Also reviewed age and gender appropriate health screening as well as vaccine recommendations. CBC, CMET, and FLP drawn today. Flu vaccine today. He has decided to do NO FURTHER colonoscopies.  An After Visit Summary was printed and given to the patient.  FOLLOW UP:  Return in about 6 months (around 11/29/2015) for routine chronic illness f/u.

## 2015-05-31 NOTE — Progress Notes (Signed)
Pre visit review using our clinic review tool, if applicable. No additional management support is needed unless otherwise documented below in the visit note. 

## 2015-05-31 NOTE — Patient Instructions (Signed)
Take 60mg  OTC generic allegra, 1 tab twice a day for your excessive throat clearing.

## 2015-06-01 ENCOUNTER — Encounter: Payer: Self-pay | Admitting: *Deleted

## 2015-07-19 ENCOUNTER — Encounter: Payer: Self-pay | Admitting: Cardiology

## 2015-07-19 ENCOUNTER — Ambulatory Visit (INDEPENDENT_AMBULATORY_CARE_PROVIDER_SITE_OTHER): Payer: Medicare Other | Admitting: Cardiology

## 2015-07-19 ENCOUNTER — Other Ambulatory Visit: Payer: Self-pay | Admitting: *Deleted

## 2015-07-19 VITALS — BP 142/90 | HR 57 | Ht 71.5 in | Wt 227.4 lb

## 2015-07-19 DIAGNOSIS — E785 Hyperlipidemia, unspecified: Secondary | ICD-10-CM | POA: Diagnosis not present

## 2015-07-19 DIAGNOSIS — I2510 Atherosclerotic heart disease of native coronary artery without angina pectoris: Secondary | ICD-10-CM

## 2015-07-19 DIAGNOSIS — R079 Chest pain, unspecified: Secondary | ICD-10-CM | POA: Diagnosis not present

## 2015-07-19 DIAGNOSIS — I10 Essential (primary) hypertension: Secondary | ICD-10-CM | POA: Diagnosis not present

## 2015-07-19 DIAGNOSIS — R072 Precordial pain: Secondary | ICD-10-CM

## 2015-07-19 DIAGNOSIS — I2583 Coronary atherosclerosis due to lipid rich plaque: Secondary | ICD-10-CM

## 2015-07-19 LAB — CK TOTAL AND CKMB (NOT AT ARMC)
CK TOTAL: 75 U/L (ref 7–232)
CK, MB: 2.7 ng/mL (ref 0.0–5.0)
Relative Index: 3.6 (ref 0.0–4.0)

## 2015-07-19 LAB — TROPONIN I: Troponin I: <0.01 ng/mL (ref <0.06)

## 2015-07-19 NOTE — Assessment & Plan Note (Signed)
The pressure is borderline. I have asked him to track this at home. If his systolic is greater than [id number] average or diastolic greater than 90 average we will increase amlodipine.

## 2015-07-19 NOTE — Assessment & Plan Note (Signed)
Continue aspirin and statin. 

## 2015-07-19 NOTE — Patient Instructions (Signed)
Medication Instructions:   NO CHANGE  Labwork:  Your physician recommends that you HAVE STAT LAB WORK TODAY  Testing/Procedures:  Your physician has requested that you have a lexiscan myoview. For further information please visit [email or web address]. Please follow instruction sheet, as given. WILL BE SCHEDULED IF LAB WORK NORMAL    Follow-Up:  Your physician recommends that you schedule a follow-up appointment in: [place]   If you need a refill on your cardiac medications before your next appointment, please call your pharmacy.

## 2015-07-19 NOTE — Assessment & Plan Note (Signed)
Patient presents with symptoms that are concerning. He had chest pain continuously on Sunday and Monday but none for the past 48 hours. His electrocardiogram shows increased T-wave inversion in the inferior leads which was present previously but more pronounced now. Patient feels his pain is not similar to his prior infarct pain.We will check troponin, CK and MB and run stat. If abnormal we will admit and he will require cardiac catheterization. If normal then he has essentially ruled out as his symptoms were over 48 hours ago. If so we will plan risk stratification with Lexiscan nuclear study. He did have problems with IV access previously and this would need to be done in the hospital.

## 2015-07-19 NOTE — Progress Notes (Signed)
HPI: FU CAD. H/o inferior MI s/p CABG x 5 in 2/08 w/ ? CVA post CABG. ABIs in August of 2010 were normal. Last nuclear study in April of 2013 showed an ejection fraction of 55%. There was a prior inferior basal infarct but no ischemia. Abdominal CT December 2015 showed no aneurysm. Since I last saw him, The patient states that this past Sunday and Monday he had continuous chest pain. It was described as a discomfort. No radiation. Not pleuritic, positional or related to food. No associated symptoms. No chest pain in the past 48 hours. Patient denies dyspnea. He has fallen because he loses his balance but no syncope.  Current Outpatient Prescriptions  Medication Sig Dispense Refill  . amLODipine (NORVASC) 5 MG tablet Take 1 tablet (5 mg total) by mouth daily. 30 tablet 11  . aspirin 325 MG tablet Take 325 mg by mouth daily.      . AZITHROMYCIN PO Take by mouth.    . docusate sodium (COLACE) 50 MG capsule Take by mouth 2 (two) times daily.    Marland Kitchen ibuprofen (ADVIL,MOTRIN) 800 MG tablet Take 800 mg by mouth daily.    . metoprolol (LOPRESSOR) 50 MG tablet TAKE 1 TABLET (50 MG TOTAL) BY MOUTH 2 (TWO) TIMES DAILY. 60 tablet 11  . Multiple Vitamin (MULTIVITAMIN) tablet Take 1 tablet by mouth daily.      . rosuvastatin (CRESTOR) 5 MG tablet Take 1/2 tab daily (Patient taking differently: Take 2.5 mg by mouth at bedtime. ) 30 tablet 12  . [DISCONTINUED] fexofenadine (ALLEGRA) 180 MG tablet Take 1 tablet (180 mg total) by mouth daily. 30 tablet 11  . [DISCONTINUED] furosemide (LASIX) 20 MG tablet Take 20 mg by mouth as needed.       No current facility-administered medications for this visit.     Past Medical History  Diagnosis Date  . DVT (deep venous thrombosis) ([place]) 2009    Post-op from laparoscopic inguinal hernia repair  . CAD (coronary artery disease)     CABG 2008:  myoview  2010 showed no ischemia and a normal EF.  Nuclear study 11/2011: EF 55%. There was a prior inferior basal infarct  but no ischemia.  . Nephrolithiasis   . Hyperlipidemia   . Hypertension   . Prostate nodule     multiple benign biopsies (Dr Rosana Hoes)  . MI (myocardial infarction) ([place]) 10/14/2006    Inferior.    . Osteoarthritis     Primarily hips  . Melanoma ([place])     skin graft surgery required  . Groin pain, left lower quadrant     Chronic, intermittent: believed to be due to scarring around MESH placed at inguinal hernia repair  . CVA (cerebral vascular accident) ([place])     possible/no residual deficit--following CABG   . Retroperitoneal hematoma 2009    on lovenox; greenfield filter placed after this  . Diverticulosis of rectosigmoid     Noted on CT 04/2011 done to eval chronic groin pain  . MELANOMA, HX OF 12/02/2008  . Nuclear sclerotic cataract of both eyes 2015    Past Surgical History  Procedure Laterality Date  . Rotator cuff repair    . Status post coronary bypass graft  10/14/06     (LIMA to the LAD, saphenous vein graft to the diagonal, saphenous vein graft to the mid obtuse marginal, saphenous vein graft placed in a sequential fashion ot the RV branch of the right coronary artery and distal right coronary artery  .  Carpal tunnel repair b    . Trigger finger x 3    . Kidney stone removal  01/18/88  . Melanoma excision  08/1990    left shoulder - skin graft from leg  . Hernia repair  05/20/2008    open, X 3  . Colonoscopy w/ polypectomy  02/07/09    Adenomatous polyps, severe diverticulosis, internal hemorrhoids.  Repeat 2015  . Greenfield filter    . Bilateral anterior total hip arthroplasty      One in 2004 and one in 2011  . Cardiovascular stress test  11/2011    Nuclear study: EF 55%. There was a prior inferior basal infarct but no ischemia    Social History   Social History  . Marital Status: Married    Spouse Name: N/A  . Number of Children: N/A  . Years of Education: N/A   Occupational History  . Not on file.   Social History Main Topics  . Smoking status: Never  Smoker   . Smokeless tobacco: Never Used  . Alcohol Use: No  . Drug Use: No  . Sexual Activity: Not on file   Other Topics Concern  . 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   No T/A/D's.    ROS: no fevers or chills, productive cough, hemoptysis, dysphasia, odynophagia, melena, hematochezia, dysuria, hematuria, rash, seizure activity, orthopnea, PND, pedal edema, claudication. Remaining systems are negative.  Physical Exam: Well-developed well-nourished in no acute distress.  Skin is warm and dry.  HEENT is normal.  Neck is supple.  Chest is clear to auscultation with normal expansion.  Cardiovascular exam is regular rate and rhythm.  Abdominal exam nontender or distended. No masses palpated. Extremities show no edema. neuro grossly intact  ECG Sinus rhythm with occasional PAC. Inferior infarct. Inferior T-wave inversion.

## 2015-07-19 NOTE — Assessment & Plan Note (Signed)
Continue statin. 

## 2015-07-19 NOTE — Telephone Encounter (Signed)
Pt's daughter is calling to get the results to some lab work that was done today.  Thanks

## 2015-07-21 NOTE — Telephone Encounter (Signed)
This encounter was created in error - please disregard.

## 2015-07-27 ENCOUNTER — Encounter (HOSPITAL_COMMUNITY)
Admission: RE | Admit: 2015-07-27 | Discharge: 2015-07-27 | Disposition: A | Discharge status: Home / Self Care | Payer: Medicare Other | Source: Ambulatory Visit | Attending: Cardiology | Admitting: Cardiology

## 2015-07-27 ENCOUNTER — Ambulatory Visit (HOSPITAL_COMMUNITY)
Admission: RE | Admit: 2015-07-27 | Discharge: 2015-07-27 | Disposition: A | Discharge status: Home / Self Care | Payer: Medicare Other | Source: Ambulatory Visit | Attending: Cardiology | Admitting: Cardiology

## 2015-07-27 ENCOUNTER — Encounter (HOSPITAL_COMMUNITY): Admission: RE | Admit: 2015-07-27 | Payer: Medicare Other | Source: Ambulatory Visit

## 2015-07-27 DIAGNOSIS — I252 Old myocardial infarction: Secondary | ICD-10-CM | POA: Insufficient documentation

## 2015-07-27 DIAGNOSIS — R079 Chest pain, unspecified: Secondary | ICD-10-CM | POA: Diagnosis not present

## 2015-07-27 DIAGNOSIS — I10 Essential (primary) hypertension: Secondary | ICD-10-CM | POA: Insufficient documentation

## 2015-07-27 DIAGNOSIS — I2510 Atherosclerotic heart disease of native coronary artery without angina pectoris: Secondary | ICD-10-CM | POA: Diagnosis not present

## 2015-07-27 DIAGNOSIS — I213 ST elevation (STEMI) myocardial infarction of unspecified site: Secondary | ICD-10-CM | POA: Diagnosis not present

## 2015-07-27 DIAGNOSIS — Z951 Presence of aortocoronary bypass graft: Secondary | ICD-10-CM | POA: Insufficient documentation

## 2015-07-27 LAB — NM MYOCAR MULTI W/SPECT W/WALL MOTION / EF
CHL CUP MPHR: 138 {beats}/min
CSEPEW: 1 METS
Exercise duration (min): 6 min
Exercise duration (sec): 21 s
Peak HR: 82 {beats}/min
Percent HR: 59 %
Rest HR: 54 {beats}/min

## 2015-07-27 MED ORDER — TECHNETIUM TC 99M SESTAMIBI GENERIC - CARDIOLITE
10.0000 | Freq: Once | INTRAVENOUS | Status: AC | PRN
  Administered 2015-07-27: 10 via INTRAVENOUS

## 2015-07-27 MED ORDER — TECHNETIUM TC 99M SESTAMIBI GENERIC - CARDIOLITE
30.0000 | Freq: Once | INTRAVENOUS | Status: AC | PRN
  Administered 2015-07-27: 30 via INTRAVENOUS

## 2015-07-27 MED ORDER — REGADENOSON 0.4 MG/5ML IV SOLN
0.4000 mg | Freq: Once | INTRAVENOUS | Status: AC
  Administered 2015-07-27: 0.4 mg via INTRAVENOUS

## 2015-07-27 MED ORDER — REGADENOSON 0.4 MG/5ML IV SOLN
INTRAVENOUS | Status: AC
  Filled 2015-07-27: qty 5

## 2015-07-27 NOTE — Progress Notes (Signed)
Lexiscan MV performed.  1 day study, [place] to read.  Rosaria Ferries, Hershal Coria 07/27/2015 9:11 AM Beeper [phone]

## 2015-09-19 NOTE — Progress Notes (Signed)
HPI: FU CAD. H/o inferior MI s/p CABG x 5 in 2/08 w/ ? CVA post CABG. ABIs in August of 2010 were normal. Abdominal CT December 2015 showed no aneurysm. Patient seen recently with chest pain. Enzymes negative. Nuclear study December 2016 showed inferior scar but no ischemia. Ejection fraction 57%. Since I last saw him, the patient denies any dyspnea on exertion, orthopnea, PND, pedal edema, palpitations, syncope or chest pain.   Current Outpatient Prescriptions  Medication Sig Dispense Refill  . amLODipine (NORVASC) 5 MG tablet Take 1 tablet (5 mg total) by mouth daily. (Patient taking differently: Take 10 mg by mouth daily. ) 30 tablet 11  . aspirin 325 MG tablet Take 325 mg by mouth daily.      . AZITHROMYCIN PO Take by mouth.    . docusate sodium (COLACE) 50 MG capsule Take by mouth 2 (two) times daily.    Marland Kitchen ibuprofen (ADVIL,MOTRIN) 800 MG tablet Take 800 mg by mouth daily.    . metoprolol (LOPRESSOR) 50 MG tablet TAKE 1 TABLET (50 MG TOTAL) BY MOUTH 2 (TWO) TIMES DAILY. 60 tablet 11  . Multiple Vitamin (MULTIVITAMIN) tablet Take 1 tablet by mouth daily.      . rosuvastatin (CRESTOR) 5 MG tablet Take 1/2 tab daily (Patient taking differently: Take 2.5 mg by mouth at bedtime. ) 30 tablet 12  . [DISCONTINUED] fexofenadine (ALLEGRA) 180 MG tablet Take 1 tablet (180 mg total) by mouth daily. 30 tablet 11  . [DISCONTINUED] furosemide (LASIX) 20 MG tablet Take 20 mg by mouth as needed.       No current facility-administered medications for this visit.     Past Medical History  Diagnosis Date  . DVT (deep venous thrombosis) ([place]) 2009    Post-op from laparoscopic inguinal hernia repair  . CAD (coronary artery disease)     CABG 2008:  myoview  2010 showed no ischemia and a normal EF.  Nuclear study 11/2011: EF 55%. There was a prior inferior basal infarct but no ischemia.  . Nephrolithiasis   . Hyperlipidemia   . Hypertension   . Prostate nodule     multiple benign biopsies (Dr  Rosana Hoes)  . MI (myocardial infarction) ([place]) 10/14/2006    Inferior.    . Osteoarthritis     Primarily hips  . Melanoma ([place])     skin graft surgery required  . Groin pain, left lower quadrant     Chronic, intermittent: believed to be due to scarring around MESH placed at inguinal hernia repair  . CVA (cerebral vascular accident) ([place])     possible/no residual deficit--following CABG   . Retroperitoneal hematoma 2009    on lovenox; greenfield filter placed after this  . Diverticulosis of rectosigmoid     Noted on CT 04/2011 done to eval chronic groin pain  . MELANOMA, HX OF 12/02/2008  . Nuclear sclerotic cataract of both eyes 2015    Past Surgical History  Procedure Laterality Date  . Rotator cuff repair    . Status post coronary bypass graft  10/14/06     (LIMA to the LAD, saphenous vein graft to the diagonal, saphenous vein graft to the mid obtuse marginal, saphenous vein graft placed in a sequential fashion ot the RV branch of the right coronary artery and distal right coronary artery  . Carpal tunnel repair b    . Trigger finger x 3    . Kidney stone removal  01/18/88  . Melanoma excision  08/1990  left shoulder - skin graft from leg  . Hernia repair  05/20/2008    open, X 3  . Colonoscopy w/ polypectomy  02/07/09    Adenomatous polyps, severe diverticulosis, internal hemorrhoids.  Repeat 2015  . Greenfield filter    . Bilateral anterior total hip arthroplasty      One in 2004 and one in 2011  . Cardiovascular stress test  11/2011    Nuclear study: EF 55%. There was a prior inferior basal infarct but no ischemia    Social History   Social History  . Marital Status: Married    Spouse Name: N/A  . Number of Children: N/A  . Years of Education: N/A   Occupational History  . Not on file.   Social History Main Topics  . Smoking status: Never Smoker   . Smokeless tobacco: Never Used  . Alcohol Use: No  . Drug Use: No  . Sexual Activity: Not on file   Other Topics  Concern  . 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   No T/A/D's.    Family History  Problem Relation Age of Onset  . Cancer Mother 69    "male"  . Heart attack Father 84  . Heart disease Father   . Heart disease Brother     ROS: no fevers or chills, productive cough, hemoptysis, dysphasia, odynophagia, melena, hematochezia, dysuria, hematuria, rash, seizure activity, orthopnea, PND, pedal edema, claudication. Remaining systems are negative.  Physical Exam: Well-developed well-nourished in no acute distress.  Skin is warm and dry.  HEENT is normal.  Neck is supple.  Chest is clear to auscultation with normal expansion.  Cardiovascular exam is regular rate and rhythm.  Abdominal exam nontender or distended. No masses palpated. Extremities show no edema. neuro grossly intact

## 2015-09-20 ENCOUNTER — Encounter: Payer: Self-pay | Admitting: Cardiology

## 2015-09-20 ENCOUNTER — Ambulatory Visit (INDEPENDENT_AMBULATORY_CARE_PROVIDER_SITE_OTHER): Payer: Medicare Other | Admitting: Cardiology

## 2015-09-20 VITALS — BP 132/80 | HR 66 | Ht 71.5 in | Wt 226.4 lb

## 2015-09-20 DIAGNOSIS — E785 Hyperlipidemia, unspecified: Secondary | ICD-10-CM

## 2015-09-20 MED ORDER — ROSUVASTATIN CALCIUM 10 MG PO TABS: 10.0000 mg | ORAL_TABLET | Freq: Every day | ORAL | Status: DC

## 2015-09-20 MED ORDER — AMLODIPINE BESYLATE 10 MG PO TABS: 10.0000 mg | ORAL_TABLET | Freq: Every day | ORAL | Status: DC

## 2015-09-20 NOTE — Assessment & Plan Note (Signed)
Continue aspirin and statin. Recent nuclear study low risk. Plan medical therapy.

## 2015-09-20 NOTE — Assessment & Plan Note (Signed)
Change Crestor to 10 mg daily. Check lipids, liver and CK in 4 weeks.

## 2015-09-20 NOTE — Assessment & Plan Note (Signed)
Blood pressure controlled. Continue present medications. 

## 2015-09-20 NOTE — Patient Instructions (Signed)
Medication Instructions:   INCREASE CRESTOR TO 10 MG ONCE DAILY  Labwork:  Your physician recommends that you return for lab work in: 4 WEEKS= DO NOT EAT PRIOR TO LAB WORK  Follow-Up:  Your physician wants you to follow-up in: [place] will receive a reminder letter in the mail two months in advance. If you don't receive a letter, please call our office to schedule the follow-up appointment.   If you need a refill on your cardiac medications before your next appointment, please call your pharmacy.

## 2015-11-20 DIAGNOSIS — E785 Hyperlipidemia, unspecified: Secondary | ICD-10-CM | POA: Diagnosis not present

## 2015-11-21 ENCOUNTER — Telehealth: Payer: Self-pay | Admitting: *Deleted

## 2015-11-21 DIAGNOSIS — R7989 Other specified abnormal findings of blood chemistry: Secondary | ICD-10-CM

## 2015-11-21 DIAGNOSIS — R945 Abnormal results of liver function studies: Principal | ICD-10-CM

## 2015-11-21 DIAGNOSIS — E785 Hyperlipidemia, unspecified: Secondary | ICD-10-CM

## 2015-11-21 LAB — LIPID PANEL
CHOLESTEROL: 127 mg/dL (ref 125–200)
HDL: 46 mg/dL (ref >=40)
LDL Cholesterol: 54 mg/dL (ref <130)
TRIGLYCERIDES: 134 mg/dL (ref <150)
Total CHOL/HDL Ratio: 2.8 Ratio (ref <=5.0)
VLDL: 27 mg/dL (ref <30)

## 2015-11-21 LAB — HEPATIC FUNCTION PANEL
ALT: 47 U/L — AB (ref 9–46)
AST: 39 U/L — AB (ref 10–35)
Albumin: 3.9 g/dL (ref 3.6–5.1)
Alkaline Phosphatase: 68 U/L (ref 40–115)
BILIRUBIN INDIRECT: 0.4 mg/dL (ref 0.2–1.2)
Bilirubin, Direct: 0.1 mg/dL (ref <=0.2)
TOTAL PROTEIN: 6.3 g/dL (ref 6.1–8.1)
Total Bilirubin: 0.5 mg/dL (ref 0.2–1.2)

## 2015-11-21 LAB — CK: Total CK: 75 U/L (ref 7–232)

## 2015-11-21 MED ORDER — ROSUVASTATIN CALCIUM 5 MG PO TABS: 2.5000 mg | ORAL_TABLET | Freq: Every day | ORAL | Status: DC

## 2015-11-21 NOTE — Telephone Encounter (Signed)
-----   Message from Lelon Perla, MD sent at 11/21/2015  5:28 AM EDT ----- Recheck lfts 12 weeks Kirk Ruths

## 2015-11-21 NOTE — Telephone Encounter (Signed)
Spoke with pt, aware of lab results and need for repeat lab work. His wife reports problems on the increased crestor so currently he is taking 2.5 mg of crestor onxce daily. Symptoms have resolved. Lab orders mailed to the pt

## 2015-11-29 ENCOUNTER — Ambulatory Visit (INDEPENDENT_AMBULATORY_CARE_PROVIDER_SITE_OTHER): Payer: Medicare Other | Admitting: Family Medicine

## 2015-11-29 ENCOUNTER — Encounter: Payer: Self-pay | Admitting: Family Medicine

## 2015-11-29 VITALS — BP 129/81 | HR 60 | Temp 98.6°F | Resp 16 | Ht 71.5 in | Wt 224.8 lb

## 2015-11-29 DIAGNOSIS — R740 Nonspecific elevation of levels of transaminase and lactic acid dehydrogenase [LDH]: Secondary | ICD-10-CM

## 2015-11-29 DIAGNOSIS — I10 Essential (primary) hypertension: Secondary | ICD-10-CM

## 2015-11-29 DIAGNOSIS — K5900 Constipation, unspecified: Secondary | ICD-10-CM | POA: Diagnosis not present

## 2015-11-29 DIAGNOSIS — R7401 Elevation of levels of liver transaminase levels: Secondary | ICD-10-CM

## 2015-11-29 DIAGNOSIS — E785 Hyperlipidemia, unspecified: Secondary | ICD-10-CM | POA: Diagnosis not present

## 2015-11-29 NOTE — Progress Notes (Signed)
OFFICE VISIT  12/03/2015   CC:  Chief Complaint  Patient presents with  . Follow-up    Pt is fasting.     HPI:    Patient is a 82 y.o. Caucasian male who presents for 6 mo f/u HTN, hyperlipidemia, hx of CAD. Recent increase in crestor from 2.5mg  to 10mg  by cardiologist resulted in myalgias and L leg "jerking". Since going back to 2.5mg  qd dosing of crestor all these sx's have resolved.  Constipated: hard stools consisting of little balls intermittently, has a stool once daily or once every other week.  He feels some mild lower abd fullness/discomfort.  He links some of it to his eating habits. Takes benefiber and colace.  Recent hx of very mildly elevated LFTs, pt says he does not want to pursue any w/u for the etiology of this at this time. A CT scan of abd/pelv in 07/2014 showed a few small hepatic cysts but liver appeared o/w normal.   Pt denies abd pain, nausea, gassiness, or skin color changes.  Past Medical History  Diagnosis Date  . DVT (deep venous thrombosis) ([facility]) 2009    Post-op from laparoscopic inguinal hernia repair  . CAD (coronary artery disease)     CABG 2008:  myoview  2010 showed no ischemia and a normal EF.  Nuclear study 11/2011: EF 55%. There was a prior inferior basal infarct but no ischemia.  . Nephrolithiasis   . Hyperlipidemia   . Hypertension   . Prostate nodule     multiple benign biopsies (Dr Rosana Hoes)  . MI (myocardial infarction) ([facility]) 10/14/2006    Inferior.    . Osteoarthritis     Primarily hips  . Melanoma ([place])     skin graft surgery required  . Groin pain, left lower quadrant     Chronic, intermittent: believed to be due to scarring around MESH placed at inguinal hernia repair  . CVA (cerebral vascular accident) ([facility])     possible/no residual deficit--following CABG   . Retroperitoneal hematoma 2009    on lovenox; greenfield filter placed after this  . Diverticulosis of rectosigmoid     Noted on CT 04/2011 done to eval chronic groin pain   . MELANOMA, HX OF 12/02/2008  . Nuclear sclerotic cataract of both eyes 2015    Past Surgical History  Procedure Laterality Date  . Rotator cuff repair    . Status post coronary bypass graft  10/14/06     (LIMA to the LAD, saphenous vein graft to the diagonal, saphenous vein graft to the mid obtuse marginal, saphenous vein graft placed in a sequential fashion ot the RV branch of the right coronary artery and distal right coronary artery  . Carpal tunnel repair b    . Trigger finger x 3    . Kidney stone removal  01/18/88  . Melanoma excision  08/1990    left shoulder - skin graft from leg  . Hernia repair  05/20/2008    open, X 3  . Colonoscopy w/ polypectomy  02/07/09    Adenomatous polyps, severe diverticulosis, internal hemorrhoids.  Repeat 2015---however, pt declines any further colon cancer screening  . Greenfield filter    . Bilateral anterior total hip arthroplasty      One in 2004 and one in 2011  . Cardiovascular stress test  11/2011    Nuclear study: EF 55%. There was a prior inferior basal infarct but no ischemia    Outpatient Prescriptions Prior to Visit  Medication Sig Dispense Refill  .  aspirin 325 MG tablet Take 325 mg by mouth daily.      Marland Kitchen docusate sodium (COLACE) 50 MG capsule Take by mouth 2 (two) times daily. Reported on 11/29/2015    . ibuprofen (ADVIL,MOTRIN) 800 MG tablet Take 800 mg by mouth daily.    . metoprolol (LOPRESSOR) 50 MG tablet TAKE 1 TABLET (50 MG TOTAL) BY MOUTH 2 (TWO) TIMES DAILY. 60 tablet 11  . Multiple Vitamin (MULTIVITAMIN) tablet Take 1 tablet by mouth daily.      . rosuvastatin (CRESTOR) 5 MG tablet Take 0.5 tablets (2.5 mg total) by mouth daily at 6 PM.    . amLODipine (NORVASC) 10 MG tablet Take 1 tablet (10 mg total) by mouth daily. (Patient not taking: Reported on 11/29/2015) 90 tablet 3  . AZITHROMYCIN PO Take by mouth. Reported on 11/29/2015     No facility-administered medications prior to visit.    Allergies  Allergen Reactions  .  Clindamycin/Lincomycin Itching    Difficulty breathing  . Penicillins Hives and Rash  . Sulfonamide Derivatives Hives and Rash    ROS As per HPI  PE: Blood pressure 129/81, pulse 60, temperature 98.6 F (37 C), temperature source Oral, resp. rate 16, height 5' 11.5" (1.816 m), weight 224 lb 12 oz (101.946 kg), SpO2 94 %. Gen: Alert, well appearing.  Patient is oriented to person, place, time, and situation. AFFECT: pleasant, lucid thought and speech. No jaundice or pallor.  LABS:  Lab Results  Component Value Date   TSH 1.77 09/15/2013   Lab Results  Component Value Date   WBC 7.9 05/31/2015   HGB 15.4 05/31/2015   HCT 46.7 05/31/2015   MCV 104.3* 05/31/2015   PLT 192.0 05/31/2015   Lab Results  Component Value Date   CREATININE 0.78 05/31/2015   BUN 11 05/31/2015   NA 140 05/31/2015   K 4.3 05/31/2015   CL 105 05/31/2015   CO2 25 05/31/2015   Lab Results  Component Value Date   ALT 47* 11/20/2015   AST 39* 11/20/2015   ALKPHOS 68 11/20/2015   BILITOT 0.5 11/20/2015   Lab Results  Component Value Date   CHOL 127 11/20/2015   Lab Results  Component Value Date   HDL 46 11/20/2015   Lab Results  Component Value Date   LDLCALC 54 11/20/2015   Lab Results  Component Value Date   TRIG 134 11/20/2015   Lab Results  Component Value Date   CHOLHDL 2.8 11/20/2015   IMPRESSION AND PLAN:  1) Hyperlipidemia: tolerating crestor 2.5mg  qd dosing but intolerant of trial of 10mg  qd dosing. Pt to continue 2.5mg  qd.  2) HTN: The current medical regimen is effective;  continue present plan and medications.  3) Mildly elevated LFTs: statin effect vs NASH.  Pt declines further testing to determine etiology of this at this time.  4) Constipation:  Instructions: Buy generic OTC senakot S and take 2 tabs every night.  Buy generic OTC miralax powder and take 1 capful with 4-6 oz of water 1-2 times a day as needed.  An After Visit Summary was printed and given to the  patient.  FOLLOW UP: Return in about 6 months (around 05/30/2016) for annual CPE (fasting).  Signed:  Crissie Sickles, MD           12/03/2015

## 2015-11-29 NOTE — Patient Instructions (Signed)
Buy generic OTC senakot S and take 2 tabs every night.  Buy generic OTC miralax powder and take 1 capful with 4-6 oz of water 1-2 times a day as needed.

## 2015-11-29 NOTE — Progress Notes (Signed)
Pre visit review using our clinic review tool, if applicable. No additional management support is needed unless otherwise documented below in the visit note. 

## 2015-12-20 DIAGNOSIS — M545 Low back pain: Secondary | ICD-10-CM | POA: Diagnosis not present

## 2015-12-20 DIAGNOSIS — M25511 Pain in right shoulder: Secondary | ICD-10-CM | POA: Diagnosis not present

## 2015-12-24 ENCOUNTER — Other Ambulatory Visit: Payer: Self-pay | Admitting: Cardiology

## 2015-12-25 NOTE — Telephone Encounter (Signed)
Rx request sent to pharmacy.  

## 2016-01-24 DIAGNOSIS — B351 Tinea unguium: Secondary | ICD-10-CM | POA: Diagnosis not present

## 2016-01-24 DIAGNOSIS — L820 Inflamed seborrheic keratosis: Secondary | ICD-10-CM | POA: Diagnosis not present

## 2016-01-26 DIAGNOSIS — B351 Tinea unguium: Secondary | ICD-10-CM | POA: Diagnosis not present

## 2016-02-12 DIAGNOSIS — R7989 Other specified abnormal findings of blood chemistry: Secondary | ICD-10-CM | POA: Diagnosis not present

## 2016-02-12 DIAGNOSIS — R799 Abnormal finding of blood chemistry, unspecified: Secondary | ICD-10-CM | POA: Diagnosis not present

## 2016-02-13 LAB — HEPATIC FUNCTION PANEL
ALT: 48 U/L — ABNORMAL HIGH (ref 9–46)
AST: 39 U/L — ABNORMAL HIGH (ref 10–35)
Albumin: 4.1 g/dL (ref 3.6–5.1)
Alkaline Phosphatase: 68 U/L (ref 40–115)
BILIRUBIN DIRECT: 0.1 mg/dL (ref <=0.2)
BILIRUBIN INDIRECT: 0.5 mg/dL (ref 0.2–1.2)
BILIRUBIN TOTAL: 0.6 mg/dL (ref 0.2–1.2)
Total Protein: 6.6 g/dL (ref 6.1–8.1)

## 2016-04-11 ENCOUNTER — Encounter: Payer: Self-pay | Admitting: Cardiology

## 2016-04-15 DIAGNOSIS — N200 Calculus of kidney: Secondary | ICD-10-CM | POA: Diagnosis not present

## 2016-04-15 DIAGNOSIS — N281 Cyst of kidney, acquired: Secondary | ICD-10-CM | POA: Diagnosis not present

## 2016-04-15 DIAGNOSIS — N401 Enlarged prostate with lower urinary tract symptoms: Secondary | ICD-10-CM | POA: Diagnosis not present

## 2016-04-22 NOTE — Progress Notes (Signed)
HPI: FU CAD. H/o inferior MI s/p CABG x 5 in 2/08 w/ ? CVA post CABG. ABIs in August of 2010 were normal. Abdominal CT December 2015 showed no aneurysm. Nuclear study December 2016 showed inferior scar but no ischemia. Ejection fraction 57%. Since I last saw him, the patient denies any dyspnea on exertion, orthopnea, PND, pedal edema, palpitations, syncope or chest pain. He has limited mobility because of bilateral leg and back pain.   Current Outpatient Prescriptions  Medication Sig Dispense Refill  . amLODipine (NORVASC) 5 MG tablet Take 0.5 mg by mouth 2 (two) times daily.  11  . aspirin 325 MG tablet Take 325 mg by mouth daily.      Marland Kitchen docusate sodium (COLACE) 50 MG capsule Take by mouth 2 (two) times daily. Reported on 11/29/2015    . ibuprofen (ADVIL,MOTRIN) 800 MG tablet Take 800 mg by mouth daily.    . metoprolol (LOPRESSOR) 50 MG tablet TAKE 1 TABLET (50 MG TOTAL) BY MOUTH 2 (TWO) TIMES DAILY. 60 tablet 3  . Multiple Vitamin (MULTIVITAMIN) tablet Take 1 tablet by mouth daily.      . rosuvastatin (CRESTOR) 5 MG tablet Take 0.5 tablets (2.5 mg total) by mouth daily at 6 PM.     No current facility-administered medications for this visit.      Past Medical History:  Diagnosis Date  . CAD (coronary artery disease)    CABG 2008:  myoview  2010 showed no ischemia and a normal EF.  Nuclear study 11/2011: EF 55%. There was a prior inferior basal infarct but no ischemia.  Marland Kitchen CVA (cerebral vascular accident) ([facility])    possible/no residual deficit--following CABG   . Diverticulosis of rectosigmoid    Noted on CT 04/2011 done to eval chronic groin pain  . DVT (deep venous thrombosis) ([facility]) 2009   Post-op from laparoscopic inguinal hernia repair  . Groin pain, left lower quadrant    Chronic, intermittent: believed to be due to scarring around MESH placed at inguinal hernia repair  . Hyperlipidemia   . Hypertension   . Melanoma ([facility])    skin graft surgery required  . MELANOMA, HX OF  12/02/2008  . MI (myocardial infarction) ([facility]) 10/14/2006   Inferior.    . Nephrolithiasis   . Nuclear sclerotic cataract of both eyes 2015  . Osteoarthritis    Primarily hips  . Prostate nodule    multiple benign biopsies (Dr Rosana Hoes)  . Retroperitoneal hematoma 2009   on lovenox; greenfield filter placed after this    Past Surgical History:  Procedure Laterality Date  . BILATERAL ANTERIOR TOTAL HIP ARTHROPLASTY     One in 2004 and one in 2011  . CARDIOVASCULAR STRESS TEST  11/2011   Nuclear study: EF 55%. There was a prior inferior basal infarct but no ischemia  . carpal tunnel repair B    . COLONOSCOPY W/ POLYPECTOMY  02/07/09   Adenomatous polyps, severe diverticulosis, internal hemorrhoids.  Repeat 2015---however, pt declines any further colon cancer screening  . greenfield filter    . HERNIA REPAIR  05/20/2008   open, X 3  . kidney stone removal  01/18/88  . MELANOMA EXCISION  08/1990   left shoulder - skin graft from leg  . ROTATOR CUFF REPAIR    . status post coronary bypass graft  10/14/06    (LIMA to the LAD, saphenous vein graft to the diagonal, saphenous vein graft to the mid obtuse marginal, saphenous vein graft placed in  a sequential fashion ot the RV branch of the right coronary artery and distal right coronary artery  . trigger finger X 3      Social History   Social History  . Marital status: Married    Spouse name: N/A  . Number of children: N/A  . Years of education: N/A   Occupational History  . Not on file.   Social History Main Topics  . Smoking status: Never Smoker  . Smokeless tobacco: Never Used  . Alcohol use No  . Drug use: No  . Sexual activity: Not on file   Other Topics Concern  . 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   No T/A/D's.    Family History  Problem Relation Age of Onset  . Cancer Mother 25    "male"  . Heart attack Father 62    . Heart disease Father   . Heart disease Brother     ROS:  ChronicLower extremity and back pain but no fevers or chills, productive cough, hemoptysis, dysphasia, odynophagia, melena, hematochezia, dysuria, hematuria, rash, seizure activity, orthopnea, PND, pedal edema, claudication. Remaining systems are negative.  Physical Exam: Well-developed well-nourished in no acute distress.  Skin is warm and dry.  HEENT is normal.  Neck is supple.  Chest is clear to auscultation with normal expansion.  Cardiovascular exam is regular rate and rhythm.  Abdominal exam nontender or distended. No masses palpated. Extremities show trace edema. neuro grossly intact  ECG-Sinus rhythm at a rate of 59. Nonspecific ST changes.  A/P  1 Coronary artery disease-continue aspirin and statin.  2 hyperlipidemia-continue statin. Check lipids and liver. He has not tolerated high-dose statins previously.  3 hypertension-blood pressure controlled. Continue present medications.  Kirk Ruths, MD

## 2016-04-24 ENCOUNTER — Encounter: Payer: Self-pay | Admitting: Cardiology

## 2016-04-24 ENCOUNTER — Ambulatory Visit (INDEPENDENT_AMBULATORY_CARE_PROVIDER_SITE_OTHER): Payer: Medicare Other | Admitting: Cardiology

## 2016-04-24 VITALS — BP 119/74 | HR 59 | Ht 71.5 in | Wt 223.4 lb

## 2016-04-24 DIAGNOSIS — E785 Hyperlipidemia, unspecified: Secondary | ICD-10-CM | POA: Diagnosis not present

## 2016-04-24 DIAGNOSIS — I10 Essential (primary) hypertension: Secondary | ICD-10-CM | POA: Diagnosis not present

## 2016-04-24 DIAGNOSIS — Z79899 Other long term (current) drug therapy: Secondary | ICD-10-CM

## 2016-04-24 DIAGNOSIS — I2510 Atherosclerotic heart disease of native coronary artery without angina pectoris: Secondary | ICD-10-CM | POA: Diagnosis not present

## 2016-04-24 NOTE — Patient Instructions (Addendum)
Your physician recommends that you continue on your current medications as directed. Please refer to the Current Medication list given to you today.   Your physician wants you to follow-up in: [place] will receive a reminder letter in the mail two months in advance. If you don't receive a letter, please call our office to schedule the follow-up appointment.  Your physician recommends that you return for lab work in: [place]

## 2016-04-25 ENCOUNTER — Telehealth: Payer: Self-pay | Admitting: *Deleted

## 2016-04-25 ENCOUNTER — Other Ambulatory Visit: Payer: Self-pay | Admitting: Cardiology

## 2016-04-25 ENCOUNTER — Other Ambulatory Visit: Payer: Medicare Other

## 2016-04-25 NOTE — Telephone Encounter (Signed)
OK with me.

## 2016-04-25 NOTE — Telephone Encounter (Signed)
Please advise 

## 2016-04-25 NOTE — Telephone Encounter (Signed)
Patient wife came in office and is wanting to transfer care to [facility]. He currently sees Dr. Anitra Lauth at the [place] office. They moved and the [place] office is closer to their house. She states you can call her if [name] is willing to accept  her husband as a patient .Her number is [phone]. Thank you.

## 2016-04-25 NOTE — Telephone Encounter (Signed)
Patient wife came in office and is wanting to transfer care to [facility]. Patient wife states they moved and the office in [place] is closer to their  house. She is wanting to make you aware. Thank you

## 2016-04-25 NOTE — Telephone Encounter (Signed)
That is ok with me if it is ok with Dr. Anitra Lauth

## 2016-04-26 ENCOUNTER — Other Ambulatory Visit: Payer: Medicare Other | Admitting: *Deleted

## 2016-04-26 DIAGNOSIS — Z79899 Other long term (current) drug therapy: Secondary | ICD-10-CM

## 2016-04-26 DIAGNOSIS — E785 Hyperlipidemia, unspecified: Secondary | ICD-10-CM | POA: Diagnosis not present

## 2016-04-26 LAB — LIPID PANEL
CHOL/HDL RATIO: 2.9 ratio (ref <=5.0)
Cholesterol: 131 mg/dL (ref 125–200)
HDL: 45 mg/dL (ref >=40)
LDL CALC: 57 mg/dL (ref <130)
TRIGLYCERIDES: 144 mg/dL (ref <150)
VLDL: 29 mg/dL (ref <30)

## 2016-04-26 LAB — HEPATIC FUNCTION PANEL
ALBUMIN: 3.7 g/dL (ref 3.6–5.1)
ALT: 43 U/L (ref 9–46)
AST: 33 U/L (ref 10–35)
Alkaline Phosphatase: 63 U/L (ref 40–115)
BILIRUBIN TOTAL: 0.5 mg/dL (ref 0.2–1.2)
Bilirubin, Direct: 0.1 mg/dL (ref <=0.2)
Indirect Bilirubin: 0.4 mg/dL (ref 0.2–1.2)
Total Protein: 6.5 g/dL (ref 6.1–8.1)

## 2016-04-29 NOTE — Telephone Encounter (Signed)
Please schedule transfer care visit.

## 2016-05-02 DIAGNOSIS — B351 Tinea unguium: Secondary | ICD-10-CM | POA: Diagnosis not present

## 2016-05-02 DIAGNOSIS — L570 Actinic keratosis: Secondary | ICD-10-CM | POA: Diagnosis not present

## 2016-05-07 DIAGNOSIS — B351 Tinea unguium: Secondary | ICD-10-CM | POA: Diagnosis not present

## 2016-05-13 DIAGNOSIS — Z23 Encounter for immunization: Secondary | ICD-10-CM | POA: Diagnosis not present

## 2016-05-13 DIAGNOSIS — D1800 Hemangioma unspecified site: Secondary | ICD-10-CM | POA: Diagnosis not present

## 2016-05-13 DIAGNOSIS — L570 Actinic keratosis: Secondary | ICD-10-CM | POA: Diagnosis not present

## 2016-05-13 DIAGNOSIS — D225 Melanocytic nevi of trunk: Secondary | ICD-10-CM | POA: Diagnosis not present

## 2016-05-13 DIAGNOSIS — B351 Tinea unguium: Secondary | ICD-10-CM | POA: Diagnosis not present

## 2016-05-13 DIAGNOSIS — Z85828 Personal history of other malignant neoplasm of skin: Secondary | ICD-10-CM | POA: Diagnosis not present

## 2016-05-13 DIAGNOSIS — L814 Other melanin hyperpigmentation: Secondary | ICD-10-CM | POA: Diagnosis not present

## 2016-05-13 DIAGNOSIS — Z85820 Personal history of malignant melanoma of skin: Secondary | ICD-10-CM | POA: Diagnosis not present

## 2016-05-13 DIAGNOSIS — L821 Other seborrheic keratosis: Secondary | ICD-10-CM | POA: Diagnosis not present

## 2016-05-14 ENCOUNTER — Encounter: Payer: Self-pay | Admitting: Adult Health

## 2016-05-14 ENCOUNTER — Ambulatory Visit (INDEPENDENT_AMBULATORY_CARE_PROVIDER_SITE_OTHER): Payer: Medicare Other | Admitting: Adult Health

## 2016-05-14 VITALS — BP 106/52 | Temp 98.6°F | Ht 71.5 in | Wt 225.0 lb

## 2016-05-14 DIAGNOSIS — I2510 Atherosclerotic heart disease of native coronary artery without angina pectoris: Secondary | ICD-10-CM | POA: Diagnosis not present

## 2016-05-14 DIAGNOSIS — Z7189 Other specified counseling: Secondary | ICD-10-CM | POA: Diagnosis not present

## 2016-05-14 DIAGNOSIS — Z23 Encounter for immunization: Secondary | ICD-10-CM | POA: Diagnosis not present

## 2016-05-14 DIAGNOSIS — Z7689 Persons encountering health services in other specified circumstances: Secondary | ICD-10-CM

## 2016-05-14 DIAGNOSIS — I10 Essential (primary) hypertension: Secondary | ICD-10-CM | POA: Diagnosis not present

## 2016-05-14 NOTE — Progress Notes (Signed)
Patient presents to clinic today to establish care. He is a pleasant 82 year old male who  has a past medical history of CAD (coronary artery disease); CVA (cerebral vascular accident) ([facility]); Diverticulosis of rectosigmoid; DVT (deep venous thrombosis) ([facility]) (2009); Groin pain, left lower quadrant; Hyperlipidemia; Hypertension; Melanoma ([facility]); MELANOMA, HX OF (12/02/2008); MI (myocardial infarction) ([facility]) (10/14/2006); Nephrolithiasis; Nuclear sclerotic cataract of both eyes (2015); Osteoarthritis; Prostate nodule; and Retroperitoneal hematoma (2009).   October 2016 was his last physical with MD McGowen    Acute Concerns: Establish Care   Chronic Issues: Hypertension - Well controlled with current medication.   Hyperlipidemia  - He feels as though this is well controlled with Crestor 5mg   CAD/MI - Is followed by Dr. Stanford Breed. Denies having any CP or SOB. He was last seen by Cardiology in January 2017    Health Maintenance: Dental -- Does not do routine care Vision -- Sees an eye doctor  Immunizations -- UTD  Colonoscopy -- No longer needs  Is followed by   Cardiology - [facility]    Past Medical History:  Diagnosis Date  . CAD (coronary artery disease)    CABG 2008:  myoview  2010 showed no ischemia and a normal EF.  Nuclear study 11/2011: EF 55%. There was a prior inferior basal infarct but no ischemia.  07/2015 no change on repeat nuclear study.  . CVA (cerebral vascular accident) ([facility])    possible/no residual deficit--following CABG   . Diverticulosis of rectosigmoid    Noted on CT 04/2011 done to eval chronic groin pain  . DVT (deep venous thrombosis) ([place]) 2009   Post-op from laparoscopic inguinal hernia repair  . Groin pain, left lower quadrant    Chronic, intermittent: believed to be due to scarring around MESH placed at inguinal hernia repair  . Hyperlipidemia   . Hypertension   . Melanoma ([facility])    skin graft surgery required  . MELANOMA, HX OF  12/02/2008  . MI (myocardial infarction) ([facility]) 10/14/2006   Inferior.    . Nephrolithiasis   . Nuclear sclerotic cataract of both eyes 2015  . Osteoarthritis    Primarily hips  . Prostate nodule    multiple benign biopsies (Dr Rosana Hoes)  . Retroperitoneal hematoma 2009   on lovenox; greenfield filter placed after this    Past Surgical History:  Procedure Laterality Date  . BILATERAL ANTERIOR TOTAL HIP ARTHROPLASTY     One in 2004 and one in 2011  . CARDIOVASCULAR STRESS TEST  11/2011; 07/2015   2013 Nuclear study: EF 55%. There was a prior inferior basal infarct but no ischemia.  No change 2016.  . carpal tunnel repair B    . COLONOSCOPY W/ POLYPECTOMY  02/07/09   Adenomatous polyps, severe diverticulosis, internal hemorrhoids.  Repeat 2015---however, pt declines any further colon cancer screening  . greenfield filter    . HERNIA REPAIR  05/20/2008   open, X 3  . kidney stone removal  01/18/88  . MELANOMA EXCISION  08/1990   left shoulder - skin graft from leg  . ROTATOR CUFF REPAIR    . status post coronary bypass graft  10/14/06    (LIMA to the LAD, saphenous vein graft to the diagonal, saphenous vein graft to the mid obtuse marginal, saphenous vein graft placed in a sequential fashion ot the RV branch of the right coronary artery and distal right coronary artery  . trigger finger X 3      Current Outpatient Prescriptions on  File Prior to Visit  Medication Sig Dispense Refill  . amLODipine (NORVASC) 5 MG tablet Take 0.5 mg by mouth 2 (two) times daily.  11  . aspirin 325 MG tablet Take 325 mg by mouth daily.      Marland Kitchen docusate sodium (COLACE) 50 MG capsule Take 100 mg by mouth 2 (two) times daily. Reported on 11/29/2015    . ibuprofen (ADVIL,MOTRIN) 800 MG tablet Take 800 mg by mouth daily.    . metoprolol (LOPRESSOR) 50 MG tablet Take 1 tablet (50 mg total) by mouth 2 (two) times daily. 60 tablet 11  . Multiple Vitamin (MULTIVITAMIN) tablet Take 1 tablet by mouth daily.      .  rosuvastatin (CRESTOR) 5 MG tablet Take 0.5 tablets (2.5 mg total) by mouth daily at 6 PM.    . [DISCONTINUED] fexofenadine (ALLEGRA) 180 MG tablet Take 1 tablet (180 mg total) by mouth daily. 30 tablet 11  . [DISCONTINUED] furosemide (LASIX) 20 MG tablet Take 20 mg by mouth as needed.       No current facility-administered medications on file prior to visit.     Allergies  Allergen Reactions  . Clindamycin Palmitate Hcl Itching    Difficulty breathing  . Clindamycin/Lincomycin Itching and Hives    Difficulty breathing  . Penicillins Hives and Rash  . Sulfa Antibiotics Hives  . Other Hives and Rash  . Sulfonamide Derivatives Hives and Rash    Family History  Problem Relation Age of Onset  . Cancer Mother 72    "male"  . Heart attack Father 57  . Heart disease Father   . Heart disease Brother     Social History   Social History  . Marital status: Married    Spouse name: N/A  . Number of children: N/A  . Years of education: N/A   Occupational History  . Not on file.   Social History Main Topics  . Smoking status: Never Smoker  . Smokeless tobacco: Never Used  . Alcohol use No  . Drug use: No  . Sexual activity: Not on file   Other Topics Concern  . 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   No T/A/D's.    Review of Systems  Constitutional: Negative.   Respiratory: Negative.   Cardiovascular: Negative.   Genitourinary: Negative.   Musculoskeletal: Negative.   Neurological: Negative.   Endo/Heme/Allergies: Negative.   Psychiatric/Behavioral: Negative.   All other systems reviewed and are negative.   BP (!) 106/52   Temp 98.6 F (37 C) (Oral)   Ht 5' 11.5" (1.816 m)   Wt 225 lb (102.1 kg)   BMI 30.94 kg/m   Physical Exam  Constitutional: He is oriented to person, place, and time and well-developed, well-nourished, and in no distress. No distress.    HENT:  Head: Normocephalic and atraumatic.  Right Ear: External ear normal.  Left Ear: External ear normal.  Nose: Nose normal.  Mouth/Throat: Oropharynx is clear and moist. No oropharyngeal exudate.  Eyes: Conjunctivae and EOM are normal. Pupils are equal, round, and reactive to light. Right eye exhibits no discharge. Left eye exhibits no discharge.  Neck: Normal range of motion. Neck supple. No thyromegaly present.  Cardiovascular: Normal rate, regular rhythm, normal heart sounds and intact distal pulses.  Exam reveals no gallop and no friction rub.   No murmur heard. Pulmonary/Chest: Effort  normal and breath sounds normal. No respiratory distress. He has no wheezes. He has no rales. He exhibits no tenderness.  Musculoskeletal: Normal range of motion.  Lymphadenopathy:    He has no cervical adenopathy.  Neurological: He is alert and oriented to person, place, and time. Gait normal. GCS score is 15.  Skin: Skin is warm and dry. No rash noted. He is not diaphoretic. No erythema. No pallor.  Psychiatric: Memory, affect and judgment normal.  Nursing note and vitals reviewed.   Recent Results (from the past 2160 hour(s))  Lipid panel     Status: None   Collection Time: 04/26/16  8:59 AM  Result Value Ref Range   Cholesterol 131 125 - 200 mg/dL   Triglycerides 144 <150 mg/dL   HDL 45 >=40 mg/dL   Total CHOL/HDL Ratio 2.9 <=5.0 Ratio   VLDL 29 <30 mg/dL   LDL Cholesterol 57 <130 mg/dL    Comment:   Total Cholesterol/HDL Ratio:CHD Risk                        Coronary Heart Disease Risk Table                                        Men       Women          1/2 Average Risk              3.4        3.3              Average Risk              5.0        4.4           2X Average Risk              9.6        7.1           3X Average Risk             23.4       11.0 Use the calculated Patient Ratio above and the CHD Risk table  to determine the patient's CHD Risk.   Hepatic function panel      Status: None   Collection Time: 04/26/16  8:59 AM  Result Value Ref Range   Total Bilirubin 0.5 0.2 - 1.2 mg/dL   Bilirubin, Direct 0.1 <=0.2 mg/dL   Indirect Bilirubin 0.4 0.2 - 1.2 mg/dL   Alkaline Phosphatase 63 40 - 115 U/L   AST 33 10 - 35 U/L   ALT 43 9 - 46 U/L   Total Protein 6.5 6.1 - 8.1 g/dL   Albumin 3.7 3.6 - 5.1 g/dL    Assessment/Plan: 1. Encounter to establish care - Follow up for CPE  - Follow up sooner if needed - Continue to eat healthy and exercise  2. HTN (hypertension), benign - Appears controlled.  - Low in the office today BP: (!) 106/52 - Would like patient to stop Amlodipine and monitor at home. He does not want to do this, states " If it isn't broke, then don't fix it."  - Will continue to monitor  3. Need for prophylactic vaccination and inoculation against influenza  - Flu vaccine HIGH DOSE PF (Fluzone High dose)  Dorothyann Peng, NP

## 2016-05-14 NOTE — Patient Instructions (Signed)
It was great meeting you today!  Please follow up with me for your physical.  Health Maintenance, Male A healthy lifestyle and preventative care can promote health and wellness.  Maintain regular health, dental, and eye exams.  Eat a healthy diet. Foods like vegetables, fruits, whole grains, low-fat dairy products, and lean protein foods contain the nutrients you need and are low in calories. Decrease your intake of foods high in solid fats, added sugars, and salt. Get information about a proper diet from your health care provider, if necessary.  Regular physical exercise is one of the most important things you can do for your health. Most adults should get at least 150 minutes of moderate-intensity exercise (any activity that increases your heart rate and causes you to sweat) each week. In addition, most adults need muscle-strengthening exercises on 2 or more days a week.   Maintain a healthy weight. The body mass index (BMI) is a screening tool to identify possible weight problems. It provides an estimate of body fat based on height and weight. Your health care provider can find your BMI and can help you achieve or maintain a healthy weight. For males 20 years and older:  A BMI below 18.5 is considered underweight.  A BMI of 18.5 to 24.9 is normal.  A BMI of 25 to 29.9 is considered overweight.  A BMI of 30 and above is considered obese.  Maintain normal blood lipids and cholesterol by exercising and minimizing your intake of saturated fat. Eat a balanced diet with plenty of fruits and vegetables. Blood tests for lipids and cholesterol should begin at age 46 and be repeated every 5 years. If your lipid or cholesterol levels are high, you are over age 54, or you are at high risk for heart disease, you may need your cholesterol levels checked more frequently.Ongoing high lipid and cholesterol levels should be treated with medicines if diet and exercise are not working.  If you smoke, find  out from your health care provider how to quit. If you do not use tobacco, do not start.  Lung cancer screening is recommended for adults aged 46-80 years who are at high risk for developing lung cancer because of a history of smoking. A yearly low-dose CT scan of the lungs is recommended for people who have at least a 30-pack-year history of smoking and are current smokers or have quit within the past 15 years. A pack year of smoking is smoking an average of 1 pack of cigarettes a day for 1 year (for example, a 30-pack-year history of smoking could mean smoking 1 pack a day for 30 years or 2 packs a day for 15 years). Yearly screening should continue until the smoker has stopped smoking for at least 15 years. Yearly screening should be stopped for people who develop a health problem that would prevent them from having lung cancer treatment.  If you choose to drink alcohol, do not have more than 2 drinks per day. One drink is considered to be 12 oz (360 mL) of beer, 5 oz (150 mL) of wine, or 1.5 oz (45 mL) of liquor.  Avoid the use of street drugs. Do not share needles with anyone. Ask for help if you need support or instructions about stopping the use of drugs.  High blood pressure causes heart disease and increases the risk of stroke. High blood pressure is more likely to develop in:  People who have blood pressure in the end of the normal range (100-139/85-89  mm Hg).  People who are overweight or obese.  People who are African American.  If you are 34-44 years of age, have your blood pressure checked every 3-5 years. If you are 31 years of age or older, have your blood pressure checked every year. You should have your blood pressure measured twice--once when you are at a hospital or clinic, and once when you are not at a hospital or clinic. Record the average of the two measurements. To check your blood pressure when you are not at a hospital or clinic, you can use:  An automated blood pressure  machine at a pharmacy.  A home blood pressure monitor.  If you are 45-36 years old, ask your health care provider if you should take aspirin to prevent heart disease.  Diabetes screening involves taking a blood sample to check your fasting blood sugar level. This should be done once every 3 years after age 41 if you are at a normal weight and without risk factors for diabetes. Testing should be considered at a younger age or be carried out more frequently if you are overweight and have at least 1 risk factor for diabetes.  Colorectal cancer can be detected and often prevented. Most routine colorectal cancer screening begins at the age of 8 and continues through age 72. However, your health care provider may recommend screening at an earlier age if you have risk factors for colon cancer. On a yearly basis, your health care provider may provide home test kits to check for hidden blood in the stool. A small camera at the end of a tube may be used to directly examine the colon (sigmoidoscopy or colonoscopy) to detect the earliest forms of colorectal cancer. Talk to your health care provider about this at age 26 when routine screening begins. A direct exam of the colon should be repeated every 5-10 years through age 71, unless early forms of precancerous polyps or small growths are found.  People who are at an increased risk for hepatitis B should be screened for this virus. You are considered at high risk for hepatitis B if:  You were born in a country where hepatitis B occurs often. Talk with your health care provider about which countries are considered high risk.  Your parents were born in a high-risk country and you have not received a shot to protect against hepatitis B (hepatitis B vaccine).  You have HIV or AIDS.  You use needles to inject street drugs.  You live with, or have sex with, someone who has hepatitis B.  You are a man who has sex with other men (MSM).  You get hemodialysis  treatment.  You take certain medicines for conditions like cancer, organ transplantation, and autoimmune conditions.  Hepatitis C blood testing is recommended for all people born from [birth year] through 1963 and any individual with known risk factors for hepatitis C.  Healthy men should no longer receive prostate-specific antigen (PSA) blood tests as part of routine cancer screening. Talk to your health care provider about prostate cancer screening.  Testicular cancer screening is not recommended for adolescents or adult males who have no symptoms. Screening includes self-exam, a health care provider exam, and other screening tests. Consult with your health care provider about any symptoms you have or any concerns you have about testicular cancer.  Practice safe sex. Use condoms and avoid high-risk sexual practices to reduce the spread of sexually transmitted infections (STIs).  You should be screened for STIs, including gonorrhea  and chlamydia if:  You are sexually active and are younger than 24 years.  You are older than 24 years, and your health care provider tells you that you are at risk for this type of infection.  Your sexual activity has changed since you were last screened, and you are at an increased risk for chlamydia or gonorrhea. Ask your health care provider if you are at risk.  If you are at risk of being infected with HIV, it is recommended that you take a prescription medicine daily to prevent HIV infection. This is called pre-exposure prophylaxis (PrEP). You are considered at risk if:  You are a man who has sex with other men (MSM).  You are a heterosexual man who is sexually active with multiple partners.  You take drugs by injection.  You are sexually active with a partner who has HIV.  Talk with your health care provider about whether you are at high risk of being infected with HIV. If you choose to begin PrEP, you should first be tested for HIV. You should then be tested  every 3 months for as long as you are taking PrEP.  Use sunscreen. Apply sunscreen liberally and repeatedly throughout the day. You should seek shade when your shadow is shorter than you. Protect yourself by wearing long sleeves, pants, a wide-brimmed hat, and sunglasses year round whenever you are outdoors.  Tell your health care provider of new moles or changes in moles, especially if there is a change in shape or color. Also, tell your health care provider if a mole is larger than the size of a pencil eraser.  A one-time screening for abdominal aortic aneurysm (AAA) and surgical repair of large AAAs by ultrasound is recommended for men aged 25-75 years who are current or former smokers.  Stay current with your vaccines (immunizations).   This information is not intended to replace advice given to you by your health care provider. Make sure you discuss any questions you have with your health care provider.   Document Released: 02/01/2008 Document Revised: 08/26/2014 Document Reviewed: 12/31/2010 Elsevier Interactive Patient Education [employer].

## 2016-05-30 ENCOUNTER — Ambulatory Visit: Payer: Medicare Other | Admitting: Family Medicine

## 2016-06-11 ENCOUNTER — Ambulatory Visit (INDEPENDENT_AMBULATORY_CARE_PROVIDER_SITE_OTHER): Payer: Medicare Other | Admitting: Adult Health

## 2016-06-11 ENCOUNTER — Encounter: Payer: Self-pay | Admitting: Adult Health

## 2016-06-11 VITALS — BP 112/64 | Temp 98.6°F | Ht 71.5 in | Wt 224.8 lb

## 2016-06-11 DIAGNOSIS — Z0000 Encounter for general adult medical examination without abnormal findings: Secondary | ICD-10-CM

## 2016-06-11 DIAGNOSIS — E785 Hyperlipidemia, unspecified: Secondary | ICD-10-CM

## 2016-06-11 DIAGNOSIS — I10 Essential (primary) hypertension: Secondary | ICD-10-CM

## 2016-06-11 NOTE — Progress Notes (Signed)
Subjective:  Patient presents today for their annual wellness visit. He is a pleasant 82 year old male who  has a past medical history of CAD (coronary artery disease); CVA (cerebral vascular accident) ([place]); Diverticulosis of rectosigmoid; DVT (deep venous thrombosis) ([place]) (2009); Groin pain, left lower quadrant; Hyperlipidemia; Hypertension; Melanoma ([place]); MELANOMA, HX OF (12/02/2008); MI (myocardial infarction) (10/14/2006); Nephrolithiasis; Nuclear sclerotic cataract of both eyes (2015); Osteoarthritis; Prostate nodule; and Retroperitoneal hematoma (2009).  He sees Cardioogy and Urology who manage him carefully for various issues. He has seen both within the year.   Preventive Screening-Counseling & Management  Smoking Status: Never Smoker Second Hand Smoking status: No smokers in home  Risk Factors Regular exercise: He does not exercise regularly  Diet: Does not follow a specific diet  Fall Risk: He does have a history of falls. Has not had one in the last year   Cardiac risk factors:  advanced age (older than 5 for men, 27 for women)  CAD/CABG/MI Hyperlipidemia- Controlled with Crestor  No diabetes.  Family History: Heart disease and MI with father and heart disease with brother  Depression Screen None. PHQ2 0   Activities of Daily Living Independent ADLs and IADLs   Hearing Difficulties: patient declines  Cognitive Testing No reported trouble.   Normal 3 word recall  List the Names of Other Physician/Practitioners you currently use: 1.Cardiology - Dr. Stanford Breed  2. Urology - Dr. Rosana Hoes  3. Dermatology    Immunization History  Administered Date(s) Administered  . Influenza Split 05/16/2011, 04/29/2012  . Influenza Whole 06/19/2009  . Influenza, High Dose Seasonal PF 05/31/2015, 05/14/2016  . Influenza,inj,Quad PF,36+ Mos 05/18/2014  . Influenza-Unspecified 05/11/2013  . Pneumococcal Conjugate-13 09/15/2013  . Pneumococcal Polysaccharide-23  12/15/2008  . Tdap 09/13/2011  . Zoster 04/08/2011   Required Immunizations needed today: None   Screening tests- up to date  ROS- No pertinent positives discovered in course of AWV  The following were reviewed and entered/updated in epic: Past Medical History:  Diagnosis Date  . CAD (coronary artery disease)    CABG 2008:  myoview  2010 showed no ischemia and a normal EF.  Nuclear study 11/2011: EF 55%. There was a prior inferior basal infarct but no ischemia.  07/2015 no change on repeat nuclear study.  . CVA (cerebral vascular accident) ([place])    possible/no residual deficit--following CABG   . Diverticulosis of rectosigmoid    Noted on CT 04/2011 done to eval chronic groin pain  . DVT (deep venous thrombosis) ([place]) 2009   Post-op from laparoscopic inguinal hernia repair  . Groin pain, left lower quadrant    Chronic, intermittent: believed to be due to scarring around MESH placed at inguinal hernia repair  . Hyperlipidemia   . Hypertension   . Melanoma ([place])    skin graft surgery required  . MELANOMA, HX OF 12/02/2008  . MI (myocardial infarction) 10/14/2006   Inferior.    . Nephrolithiasis   . Nuclear sclerotic cataract of both eyes 2015  . Osteoarthritis    Primarily hips  . Prostate nodule    multiple benign biopsies (Dr Rosana Hoes)  . Retroperitoneal hematoma 2009   on lovenox; greenfield filter placed after this   Patient Active Problem List   Diagnosis Date Noted  . Chest pain 07/19/2015  . H/O total hip arthroplasty 12/12/2014  . Kidney stone 08/08/2014  . Renal colic [id number]  . Nephrolithiasis 08/07/2014  . Hepatic steatosis 08/07/2014  . Cholelithiasis 08/07/2014  . CAD (coronary artery disease)  09/15/2013  . Cervical spine pain 09/11/2012  . 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  . Health maintenance examination 09/14/2011  . Hyperlipidemia 09/14/2011  . HTN (hypertension), benign 09/14/2011  . Macrocytosis without anemia 09/14/2011  . DYSPNEA 07/26/2009  . LEG PAIN,  LEFT 03/16/2009  . OSTEOARTHRITIS, HIP, RIGHT 12/02/2008   Past Surgical History:  Procedure Laterality Date  . BILATERAL ANTERIOR TOTAL HIP ARTHROPLASTY     One in 2004 and one in 2011  . brain hemorrhage     at age 67  . CARDIOVASCULAR STRESS TEST  11/2011; 07/2015   2013 Nuclear study: EF 55%. There was a prior inferior basal infarct but no ischemia.  No change 2016.  . carpal tunnel repair B Bilateral   . CATARACT EXTRACTION    . COLONOSCOPY W/ POLYPECTOMY  02/07/09   Adenomatous polyps, severe diverticulosis, internal hemorrhoids.  Repeat 2015---however, pt declines any further colon cancer screening  . greenfield filter    . HERNIA REPAIR  05/20/2008   open, X 3  . kidney stone removal  01/18/1988   x 5   . MELANOMA EXCISION  08/1990   left shoulder - skin graft from leg  . ROTATOR CUFF REPAIR Bilateral   . status post coronary bypass graft  10/14/06    (LIMA to the LAD, saphenous vein graft to the diagonal, saphenous vein graft to the mid obtuse marginal, saphenous vein graft placed in a sequential fashion ot the RV branch of the right coronary artery and distal right coronary artery  . trigger finger X 3      Family History  Problem Relation Age of Onset  . Cancer Mother 50    uterine and ovarian   . Heart attack Father 33  . Heart disease Father   . Heart disease Brother     Medications- reviewed and updated Current Outpatient Prescriptions  Medication Sig Dispense Refill  . amLODipine (NORVASC) 5 MG tablet Take 0.5 mg by mouth 2 (two) times daily.  11  . aspirin 325 MG tablet Take 325 mg by mouth daily.      Marland Kitchen docusate sodium (COLACE) 50 MG capsule Take 100 mg by mouth 2 (two) times daily. Reported on 11/29/2015    . ibuprofen (ADVIL,MOTRIN) 800 MG tablet Take 800 mg by mouth daily.    . metoprolol (LOPRESSOR) 50 MG tablet Take 1 tablet (50 mg total) by mouth 2 (two) times daily. 60 tablet 11  . Multiple Vitamin (MULTIVITAMIN) tablet Take 1 tablet by mouth daily.       . rosuvastatin (CRESTOR) 5 MG tablet Take 0.5 tablets (2.5 mg total) by mouth daily at 6 PM.     No current facility-administered medications for this visit.     Allergies-reviewed and updated Allergies  Allergen Reactions  . Clindamycin Palmitate Hcl Itching    Difficulty breathing  . Clindamycin/Lincomycin Itching and Hives    Difficulty breathing  . Penicillins Hives and Rash  . Sulfa Antibiotics Hives  . Other Hives and Rash  . Sulfonamide Derivatives Hives and Rash    Social History   Social History  . Marital status: Married    Spouse name: N/A  . Number of children: N/A  . Years of education: N/A   Social History Main Topics  . Smoking status: Never Smoker  . Smokeless tobacco: Never Used  . Alcohol use No  . Drug use: No  . Sexual activity: Not Asked   Other Topics Concern  . None   Social History Narrative  Married 50+ yrs.   Retired Risk analyst.   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       Objective: BP 112/64   Temp 98.6 F (37 C) (Oral)   Ht 5' 11.5" (1.816 m)   Wt 224 lb 12.8 oz (102 kg)   BMI 30.92 kg/m  Gen: NAD, resting comfortably HEENT: Mucous membranes are moist. Oropharynx normal Neck: no thyromegaly CV: RRR no murmurs rubs or gallops Lungs: CTAB no crackles, wheeze, rhonchi Abdomen: soft/nontender/nondistended/normal bowel sounds. No rebound or guarding.  Ext: no edema Skin: warm, dry Neuro: grossly normal, moves all extremities, PERRLA  Assessment/Plan: 1. Encounter for Medicare annual wellness exam - Labs and EKG reviewed from last cardiology visit in September - Continue to stay active and engaged  - Follow up with specialists as needed - Fall precautions reviewed with patient.  - Increase fiber and daily intake of water - Follow up as needed  2. HTN (hypertension), benign - Well controlled  3. Hyperlipidemia, unspecified hyperlipidemia type - Well controlled  Dorothyann Peng, NP

## 2016-09-17 ENCOUNTER — Other Ambulatory Visit: Payer: Self-pay

## 2016-09-17 ENCOUNTER — Telehealth: Payer: Self-pay | Admitting: *Deleted

## 2016-09-17 MED ORDER — AMLODIPINE BESYLATE 5 MG PO TABS: 0.5000 mg | ORAL_TABLET | Freq: Two times a day (BID) | ORAL | 11 refills | Status: DC

## 2016-09-17 MED ORDER — AMLODIPINE BESYLATE 10 MG PO TABS: 10.0000 mg | ORAL_TABLET | Freq: Every day | ORAL | 3 refills | Status: DC

## 2016-09-17 NOTE — Telephone Encounter (Signed)
amLODipine (NORVASC) 5 MG tablet  Medication  Date: 09/17/2016 Department: [facility] Ordering/Authorizing: Lelon Perla, MD  Order Providers   Prescribing Provider Encounter Provider  Lelon Perla, MD Carla Drape, CMA  Medication Detail    Disp Refills Start End   amLODipine (NORVASC) 5 MG tablet 1 tablet 11 09/17/2016    Sig - Route: Take 0.5 tablets (2.5 mg total) by mouth 2 (two) times daily. - Oral   E-Prescribing Status: Receipt confirmed by pharmacy (09/17/2016 10:57 AM EST)   Pharmacy   [id number] - [place], [address] AT [address]

## 2016-09-17 NOTE — Telephone Encounter (Signed)
Spoke with pt, he does not know when the script changed but he reports taking amlodipine 10 mg daily for sometime. New script sent to the pharmacy and encouraged patient to bring a current list of medications to next appointment.

## 2016-09-18 ENCOUNTER — Other Ambulatory Visit: Payer: Self-pay | Admitting: Cardiology

## 2016-09-30 ENCOUNTER — Telehealth: Payer: Self-pay | Admitting: Cardiology

## 2016-09-30 MED ORDER — ATORVASTATIN CALCIUM 10 MG PO TABS: 10.0000 mg | ORAL_TABLET | Freq: Every day | ORAL | 3 refills | Status: DC

## 2016-09-30 NOTE — Telephone Encounter (Signed)
Have him try atorvastatin 10 mg daily. If develops cramping/muscle aches, stop for 2 weeks then retry at 5 mg daily.

## 2016-09-30 NOTE — Telephone Encounter (Signed)
Communicated advice for change in therapy along w conditional hold instructions, should patient encounter problems on this medication. [facility] Wife aware to call back if any further guidance needed, and she expressed thanks for call.

## 2016-09-30 NOTE — Telephone Encounter (Signed)
Mrs. Tysdal is calling on behalf of patient (Bernard Byrd). She states that he is out of crestor and would like to know if he is supposed to continue taking medication. Please call, thanks.

## 2016-09-30 NOTE — Telephone Encounter (Signed)
Spoke to wife. She explains that the patient needs crestor refilled, but she checked with the pharmacy, and it is $80 for 15 tablets, so she's wondering if he can be prescribed a cheaper medication.  I advised that this may be due to deductible requirements -- she explains that they don't have Rx insurance. Paying out of pocket on all meds.  He takes the crestor 5mg  tablets, 1/2 tablet daily. Only concern is muscle cramps on larger dose of rosuvastatin. She's unaware that he has sensitivity to other statins.  Asking if suitable alternative. Aware I'll send this to be reviewed and contact them w recommendation.

## 2016-10-11 ENCOUNTER — Encounter: Payer: Self-pay | Admitting: Cardiology

## 2016-10-14 DIAGNOSIS — Z23 Encounter for immunization: Secondary | ICD-10-CM | POA: Diagnosis not present

## 2016-10-14 DIAGNOSIS — D485 Neoplasm of uncertain behavior of skin: Secondary | ICD-10-CM | POA: Diagnosis not present

## 2016-10-15 DIAGNOSIS — B079 Viral wart, unspecified: Secondary | ICD-10-CM | POA: Diagnosis not present

## 2016-10-17 NOTE — Progress Notes (Signed)
HPI: FU CAD. H/o inferior MI s/p CABG x 5 in 2/08 w/ ? CVA post CABG. ABIs in August of 2010 were normal. Abdominal CT December 2015 showed no aneurysm. Nuclear study December 2016 showed inferior scar but no ischemia. Ejection fraction 57%. Since I last saw him, the patient denies any dyspnea on exertion, orthopnea, PND, pedal edema, palpitations, syncope or chest pain.   Current Outpatient Prescriptions  Medication Sig Dispense Refill  . amLODipine (NORVASC) 10 MG tablet Take 1 tablet (10 mg total) by mouth daily. 90 tablet 3  . aspirin 325 MG tablet Take 325 mg by mouth daily.      Marland Kitchen atorvastatin (LIPITOR) 10 MG tablet Take 1 tablet (10 mg total) by mouth daily. 30 tablet 3  . docusate sodium (COLACE) 50 MG capsule Take 100 mg by mouth 2 (two) times daily. Reported on 11/29/2015    . ibuprofen (ADVIL,MOTRIN) 800 MG tablet Take 800 mg by mouth daily.    . metoprolol (LOPRESSOR) 50 MG tablet Take 1 tablet (50 mg total) by mouth 2 (two) times daily. 60 tablet 11  . Multiple Vitamin (MULTIVITAMIN) tablet Take 1 tablet by mouth daily.       No current facility-administered medications for this visit.      Past Medical History:  Diagnosis Date  . CAD (coronary artery disease)    CABG 2008:  myoview  2010 showed no ischemia and a normal EF.  Nuclear study 11/2011: EF 55%. There was a prior inferior basal infarct but no ischemia.  07/2015 no change on repeat nuclear study.  . CVA (cerebral vascular accident) ([place])    possible/no residual deficit--following CABG   . Diverticulosis of rectosigmoid    Noted on CT 04/2011 done to eval chronic groin pain  . DVT (deep venous thrombosis) ([place]) 2009   Post-op from laparoscopic inguinal hernia repair  . Groin pain, left lower quadrant    Chronic, intermittent: believed to be due to scarring around MESH placed at inguinal hernia repair  . Hyperlipidemia   . Hypertension   . Melanoma ([place])    skin graft surgery required  . MELANOMA, HX OF  12/02/2008  . MI (myocardial infarction) 10/14/2006   Inferior.    . Nephrolithiasis   . Nuclear sclerotic cataract of both eyes 2015  . Osteoarthritis    Primarily hips  . Prostate nodule    multiple benign biopsies (Dr Rosana Hoes)  . Retroperitoneal hematoma 2009   on lovenox; greenfield filter placed after this    Past Surgical History:  Procedure Laterality Date  . BILATERAL ANTERIOR TOTAL HIP ARTHROPLASTY     One in 2004 and one in 2011  . brain hemorrhage     at age 89  . CARDIOVASCULAR STRESS TEST  11/2011; 07/2015   2013 Nuclear study: EF 55%. There was a prior inferior basal infarct but no ischemia.  No change 2016.  . carpal tunnel repair B Bilateral   . CATARACT EXTRACTION    . COLONOSCOPY W/ POLYPECTOMY  02/07/09   Adenomatous polyps, severe diverticulosis, internal hemorrhoids.  Repeat 2015---however, pt declines any further colon cancer screening  . greenfield filter    . HERNIA REPAIR  05/20/2008   open, X 3  . kidney stone removal  01/18/1988   x 5   . MELANOMA EXCISION  08/1990   left shoulder - skin graft from leg  . ROTATOR CUFF REPAIR Bilateral   . status post coronary bypass graft  10/14/06    (  LIMA to the LAD, saphenous vein graft to the diagonal, saphenous vein graft to the mid obtuse marginal, saphenous vein graft placed in a sequential fashion ot the RV branch of the right coronary artery and distal right coronary artery  . trigger finger X 3      Social History   Social History  . Marital status: Married    Spouse name: N/A  . Number of children: N/A  . Years of education: N/A   Occupational History  . Not on file.   Social History Main Topics  . Smoking status: Never Smoker  . Smokeless tobacco: Never Used  . Alcohol use No  . Drug use: No  . Sexual activity: Not on file   Other Topics Concern  . 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        Family History  Problem Relation Age of Onset  . Cancer Mother 71    uterine and ovarian   . Heart attack Father 48  . Heart disease Father   . Heart disease Brother     ROS: no fevers or chills, productive cough, hemoptysis, dysphasia, odynophagia, melena, hematochezia, dysuria, hematuria, rash, seizure activity, orthopnea, PND, pedal edema, claudication. Remaining systems are negative.  Physical Exam: Well-developed well-nourished in no acute distress.  Skin is warm and dry.  HEENT is normal.  Neck is supple. No bruits Chest is clear to auscultation with normal expansion.  Cardiovascular exam is regular rate and rhythm.  Abdominal exam nontender or distended. No masses palpated. Extremities show no edema. neuro grossly intact  ECG- sinus bradycardia at a rate of 53. Nonspecific T-wave changes; personally reviewed  A/P  1 coronary artery disease-continue aspirin and statin.  2 hypertension-blood pressure controlled. Continue present medications.  3 hyperlipidemia-continue statin. Note he has not tolerated high-dose previously.  Kirk Ruths, MD

## 2016-10-21 ENCOUNTER — Encounter: Payer: Self-pay | Admitting: Cardiology

## 2016-10-21 ENCOUNTER — Ambulatory Visit (INDEPENDENT_AMBULATORY_CARE_PROVIDER_SITE_OTHER): Payer: Medicare Other | Admitting: Cardiology

## 2016-10-21 VITALS — BP 120/60 | HR 53 | Ht 71.0 in | Wt 229.0 lb

## 2016-10-21 DIAGNOSIS — I25810 Atherosclerosis of coronary artery bypass graft(s) without angina pectoris: Secondary | ICD-10-CM

## 2016-10-21 DIAGNOSIS — E7800 Pure hypercholesterolemia, unspecified: Secondary | ICD-10-CM

## 2016-10-21 DIAGNOSIS — I10 Essential (primary) hypertension: Secondary | ICD-10-CM

## 2016-10-21 NOTE — Patient Instructions (Signed)
Your physician wants you to follow-up in: ONE YEAR WITH DR CRENSHAW You will receive a reminder letter in the mail two months in advance. If you don't receive a letter, please call our office to schedule the follow-up appointment.   If you need a refill on your cardiac medications before your next appointment, please call your pharmacy.  

## 2016-11-29 IMAGING — CT CT ABD-PELV W/O CM
2 of 6 series · 9 of 46 positions shown, 10 images · non-contrast
Comparison: Pelvic MRI 05/05/2013.  Abdominal pelvic CT 06/14/2012.

CLINICAL DATA: Right-sided low abdominal pain extending into the
back. History of kidney stones. Difficulty urinating today.

EXAM:
CT ABDOMEN AND PELVIS WITHOUT CONTRAST
TECHNIQUE: Multidetector CT imaging of the abdomen and pelvis was performed
following the standard protocol without IV contrast.

[Series 201: routine, idose (2) · axial · 0.95mm/px · z∈[+76,+471]mm · 6 of 103 slices shown, 7 images]
[im 12/103  soft-tissue]
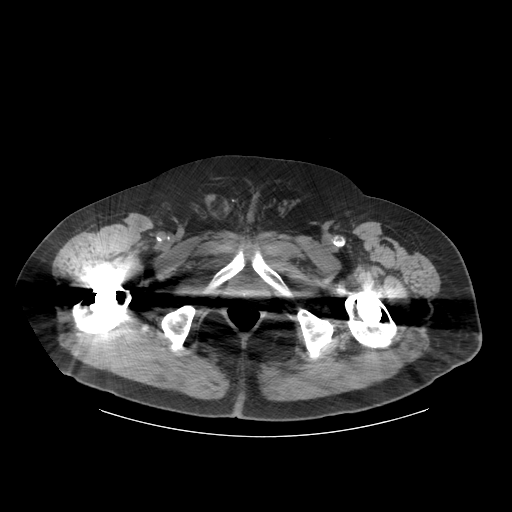
[im 12/103  bone]
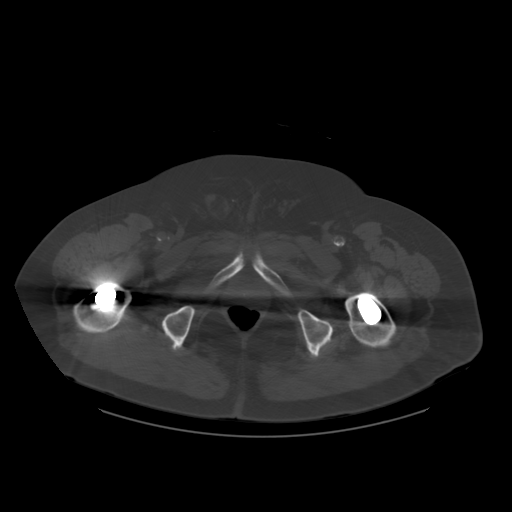
[im 35/103  soft-tissue]
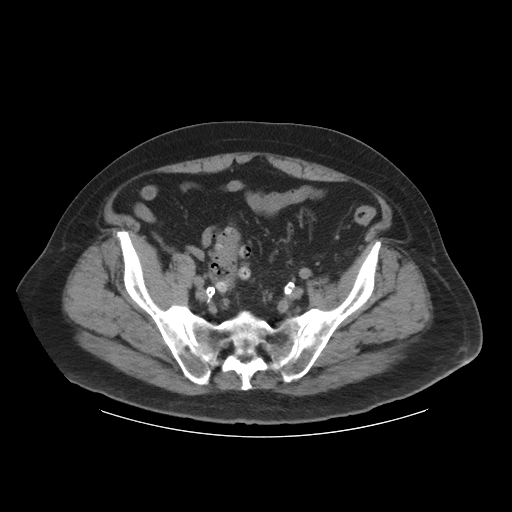
[im 46/103  soft-tissue]
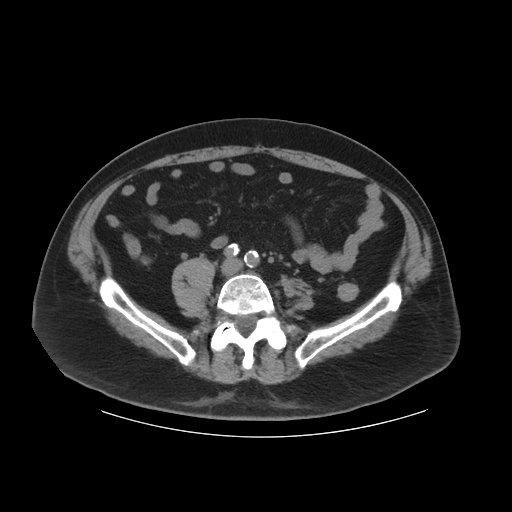
[im 57/103  soft-tissue]
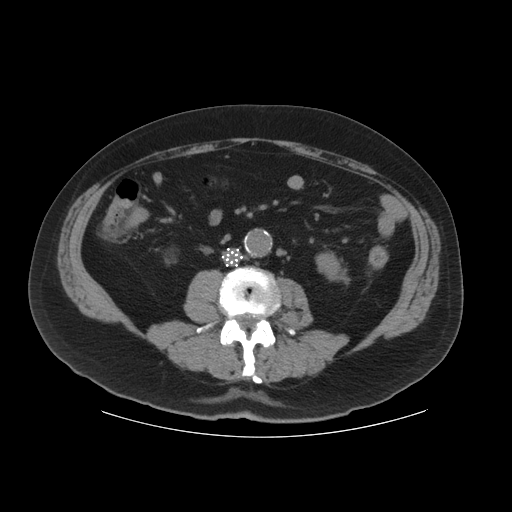
[im 80/103  soft-tissue]
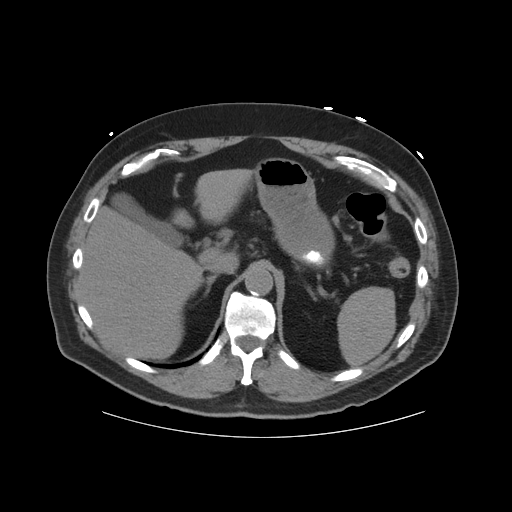
[im 91/103  soft-tissue]
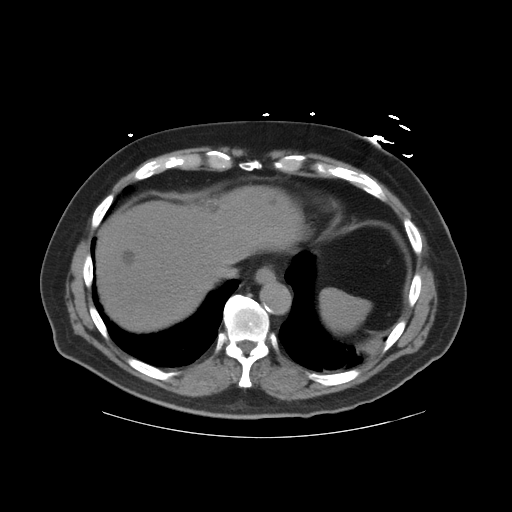

[Series 202: coronals, idose (2) · coronal · 0.50mm/px · 3 of 128 slices shown]
[im 43/128  soft-tissue]
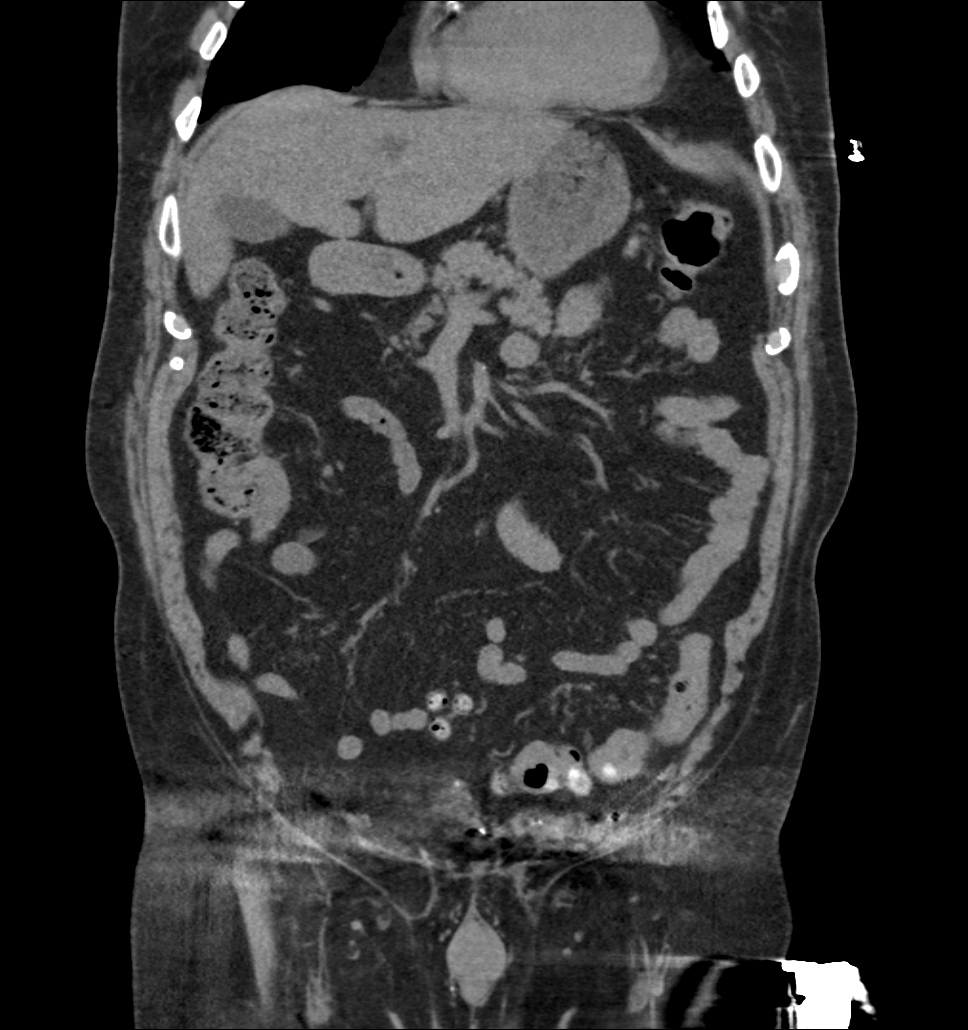
[im 57/128  soft-tissue]
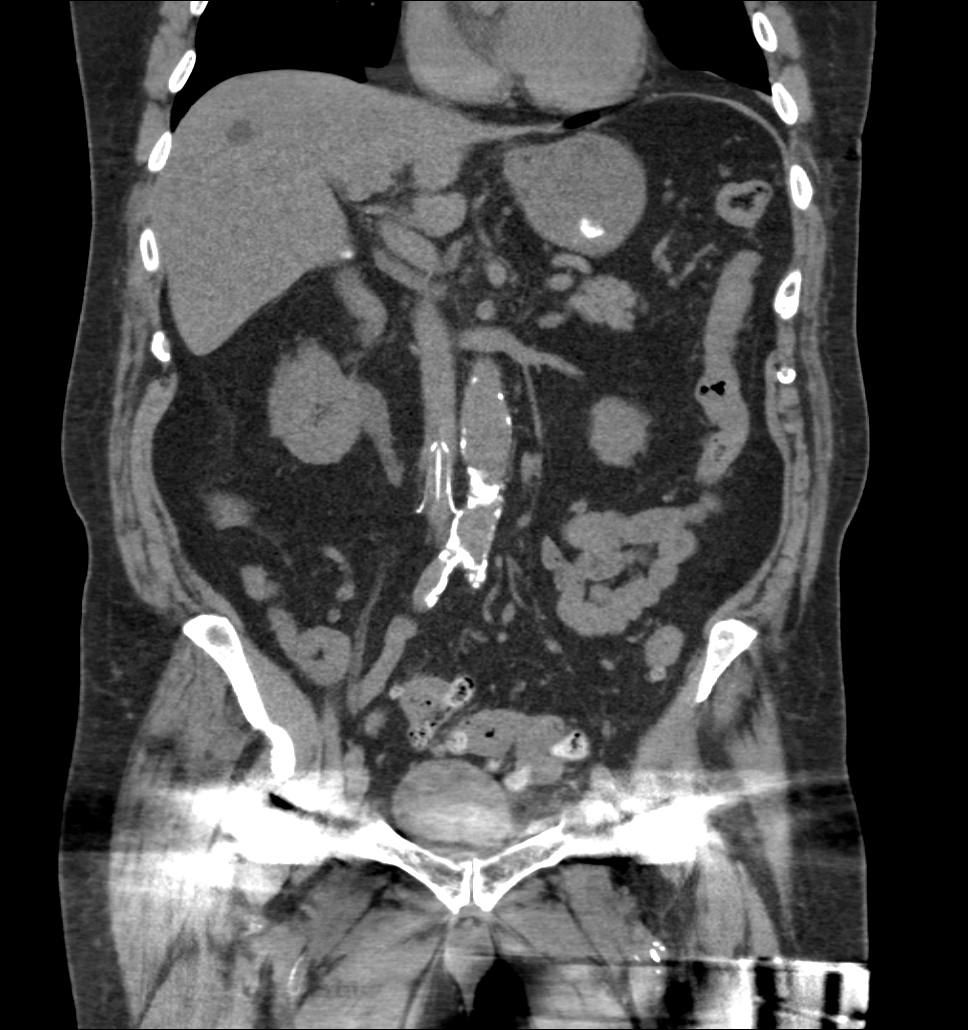
[im 71/128  soft-tissue]
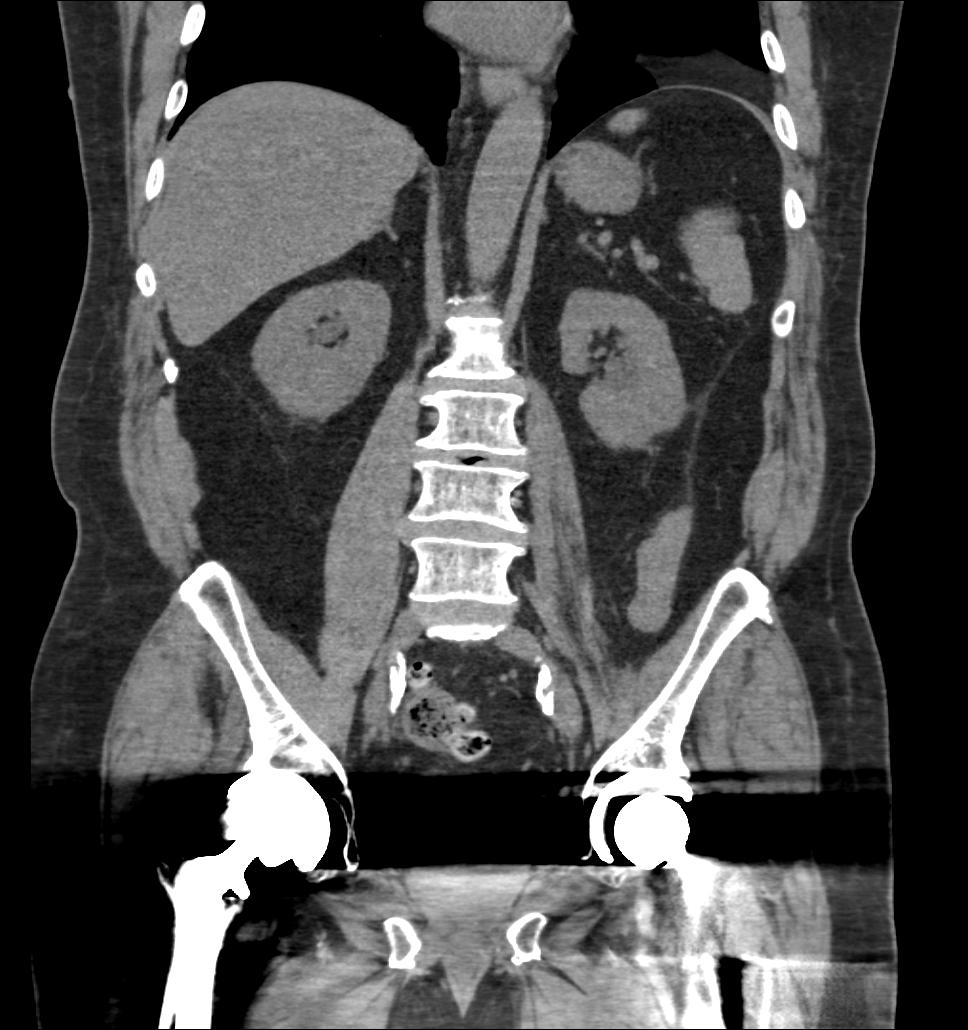

[9 of 46 positions shown; findings below may reference images not displayed]

FINDINGS: Lower chest: There is streaky atelectasis or scarring in both lung
bases, unchanged from the prior study.

Hepatobiliary: Multiple hepatic cysts are grossly unchanged. There
is diffuse hepatic steatosis. No new or enlarging liver lesions are
identified. There is a small calcified gallstone in the gallbladder
neck. There is no gallbladder wall distention, wall thickening or
surrounding inflammatory change. There is no biliary dilatation.

Pancreas: Unremarkable. No pancreatic ductal dilatation or
surrounding inflammatory changes.

Spleen: Normal in size without focal abnormality.

Adrenals/Urinary Tract: Both adrenal glands appear normal.There is a
2 mm nonobstructing calculus in the interpolar region of the right
kidney. There is a tiny calculus in the lower pole of the left
kidney. There is mild right-sided hydronephrosis and hydroureter.
The right ureter is obstructed proximal to a 2 mm calculus on image
57. This is located at the L5 level. There is no left-sided
hydronephrosis. The bladder is partly obscured by artifact from the
bilateral total hip arthroplasties, but demonstrates no definite
abnormality.

Stomach/Bowel: There are extensive diverticular changes throughout
the descending and sigmoid colon without surrounding inflammatory
change. There may be mild colonic wall thickening. The stomach and
small bowel demonstrate no significant findings. The appendix
appears normal. There is no extraluminal fluid collection.

Vascular/Lymphatic: Small lymph nodes within the porta hepatis are
not pathologically enlarged. There is no retroperitoneal or pelvic
lymphadenopathy. There is diffuse atherosclerosis of the aorta, its
branches and the iliac arteries. IVC filter noted.

Reproductive: Mild to moderate enlargement of the prostate gland,
grossly stable.

Other: There are postsurgical changes within the anterior abdominal
wall status post hernia repair. There is asymmetric fat within the
right inguinal canal which appears unchanged.

Musculoskeletal: No acute or significant osseous findings. Previous
bilateral total hip arthroplasty. Mild lumbar spondylosis noted.
IMPRESSION: 1. Obstructing 2 mm calculus in the mid right ureter.
2. Nonobstructing bilateral renal calculi.
3. Diffuse atherosclerosis.
4. Hepatic steatosis, hepatic cysts and cholelithiasis noted.

## 2016-12-02 IMAGING — CR DG ABD PORTABLE 1V
1 series · 1 of 1 positions shown · non-contrast
Comparison: Portable exam 9938 hr without priors for comparison ;
correlation made to CT abdomen and pelvis exam of 08/05/2014

CLINICAL DATA: RIGHT flank pain since [REDACTED]

EXAM:
PORTABLE ABDOMEN - 1 VIEW

[AP]
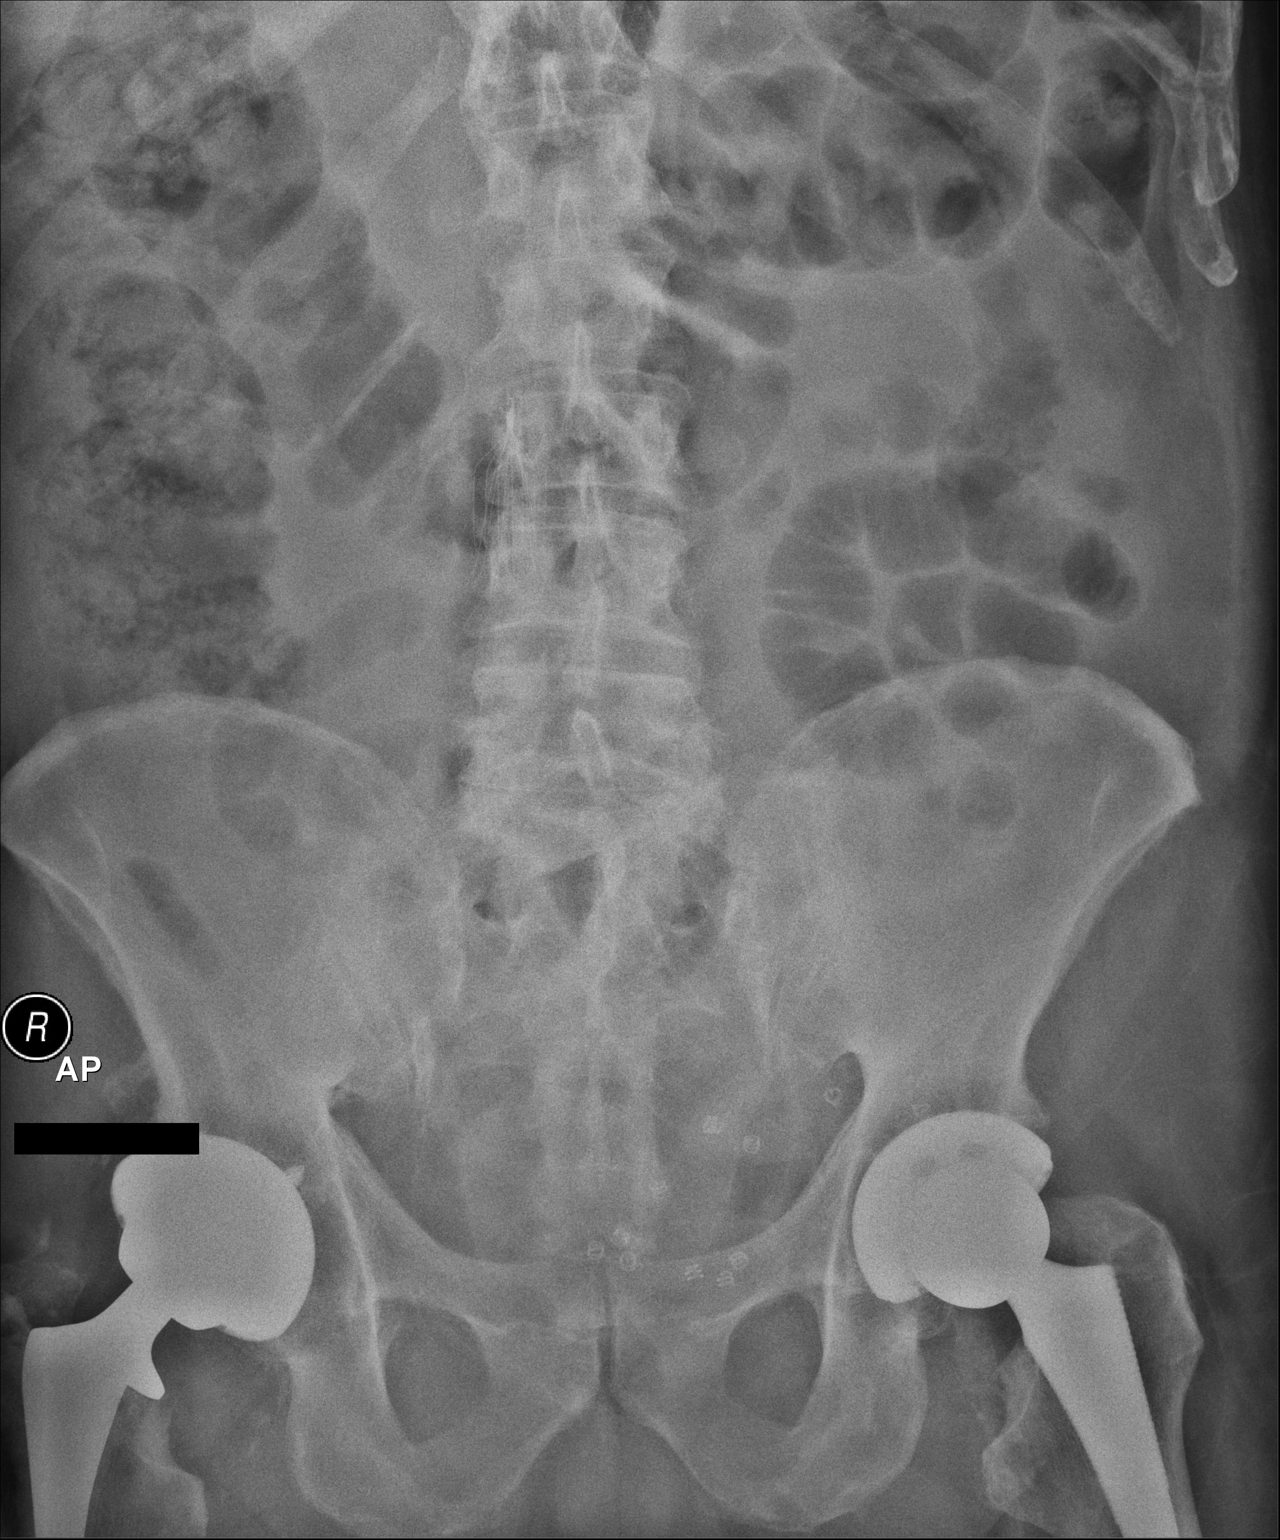

[1 of 1 positions shown; findings below may reference images not displayed]

FINDINGS: IVC filter noted.

BILATERAL hip prostheses.

Question prior LEFT inguinal hernia repair.

Normal bowel gas pattern without bowel dilatation or wall
thickening.

No urinary tract calcification identified.

Specifically, the 2 mm mid RIGHT ureteral calculus seen on prior CT
exam is not identified.

No acute osseous findings.
IMPRESSION: No urinary tract calcification identified.

With a small size of the previously identified calculus, this could
be present but radiographically occult; if there is persistent
clinical concern for retained RIGHT ureteral calculus, recommend
repeat CT imaging.

## 2016-12-16 DIAGNOSIS — M48061 Spinal stenosis, lumbar region without neurogenic claudication: Secondary | ICD-10-CM | POA: Diagnosis not present

## 2016-12-16 DIAGNOSIS — M25551 Pain in right hip: Secondary | ICD-10-CM | POA: Diagnosis not present

## 2016-12-31 DIAGNOSIS — M545 Low back pain: Secondary | ICD-10-CM | POA: Diagnosis not present

## 2016-12-31 DIAGNOSIS — M47817 Spondylosis without myelopathy or radiculopathy, lumbosacral region: Secondary | ICD-10-CM | POA: Diagnosis not present

## 2016-12-31 DIAGNOSIS — M48061 Spinal stenosis, lumbar region without neurogenic claudication: Secondary | ICD-10-CM | POA: Diagnosis not present

## 2017-01-06 DIAGNOSIS — M545 Low back pain: Secondary | ICD-10-CM | POA: Diagnosis not present

## 2017-01-07 ENCOUNTER — Other Ambulatory Visit: Payer: Self-pay

## 2017-01-07 DIAGNOSIS — M545 Low back pain: Secondary | ICD-10-CM | POA: Diagnosis not present

## 2017-01-07 DIAGNOSIS — M47817 Spondylosis without myelopathy or radiculopathy, lumbosacral region: Secondary | ICD-10-CM | POA: Diagnosis not present

## 2017-01-07 NOTE — Telephone Encounter (Signed)
Pt is requesting refill on Ibuprofen 800mg .  Please advise. Thanks!

## 2017-01-08 ENCOUNTER — Telehealth: Payer: Self-pay | Admitting: Cardiology

## 2017-01-08 DIAGNOSIS — E7800 Pure hypercholesterolemia, unspecified: Secondary | ICD-10-CM

## 2017-01-08 NOTE — Telephone Encounter (Signed)
New message    Pt is calling asking for a call back from RN. He did not say what it was about. He said he would talk to RN.

## 2017-01-08 NOTE — Telephone Encounter (Signed)
Ok to check lipid panel [facility]

## 2017-01-08 NOTE — Telephone Encounter (Signed)
Spoke with pt, Lab orders mailed to the pt  

## 2017-01-08 NOTE — Telephone Encounter (Signed)
Returned call to patient He states he is no longer taking atorvastatin 10mg  d/t leg pain, cramping He has been off this about 1 month He wants to know if Dr. Stanford Breed would order lipid panel for him to check his labs since he is no longer on statin therapy Last lipid 04/2016  Routed to MD/RN

## 2017-01-09 MED ORDER — IBUPROFEN 800 MG PO TABS: 800.0000 mg | ORAL_TABLET | Freq: Every day | ORAL | 0 refills | Status: DC

## 2017-01-09 NOTE — Telephone Encounter (Signed)
Ok to refill 90 + 0

## 2017-01-16 ENCOUNTER — Other Ambulatory Visit: Payer: Medicare Other | Admitting: *Deleted

## 2017-01-16 DIAGNOSIS — E7800 Pure hypercholesterolemia, unspecified: Secondary | ICD-10-CM

## 2017-01-16 DIAGNOSIS — I10 Essential (primary) hypertension: Secondary | ICD-10-CM

## 2017-01-16 LAB — LIPID PANEL
CHOL/HDL RATIO: 3.7 ratio (ref 0.0–5.0)
Cholesterol, Total: 194 mg/dL (ref 100–199)
HDL: 53 mg/dL (ref >39)
LDL CALC: 115 mg/dL — AB (ref 0–99)
Triglycerides: 130 mg/dL (ref 0–149)
VLDL CHOLESTEROL CAL: 26 mg/dL (ref 5–40)

## 2017-01-16 NOTE — Addendum Note (Signed)
Addended by: Eulis Foster on: 01/16/2017 08:35 AM   Modules accepted: Orders

## 2017-01-21 ENCOUNTER — Encounter: Payer: Self-pay | Admitting: Adult Health

## 2017-01-21 ENCOUNTER — Ambulatory Visit (INDEPENDENT_AMBULATORY_CARE_PROVIDER_SITE_OTHER): Payer: Medicare Other | Admitting: Adult Health

## 2017-01-21 VITALS — BP 118/70 | Temp 98.6°F | Ht 71.0 in | Wt 224.3 lb

## 2017-01-21 DIAGNOSIS — L259 Unspecified contact dermatitis, unspecified cause: Secondary | ICD-10-CM | POA: Diagnosis not present

## 2017-01-21 DIAGNOSIS — H6123 Impacted cerumen, bilateral: Secondary | ICD-10-CM | POA: Diagnosis not present

## 2017-01-21 DIAGNOSIS — I25810 Atherosclerosis of coronary artery bypass graft(s) without angina pectoris: Secondary | ICD-10-CM

## 2017-01-21 MED ORDER — TRIAMCINOLONE ACETONIDE 0.1 % EX CREA
1.0000 | TOPICAL_CREAM | Freq: Two times a day (BID) | CUTANEOUS | 0 refills | Status: DC
Start: 2017-01-21 — End: 2017-02-21

## 2017-01-21 MED ORDER — PREDNISONE 10 MG PO TABS: ORAL_TABLET | ORAL | 0 refills | Status: DC

## 2017-01-21 NOTE — Progress Notes (Signed)
Subjective:    Patient ID: Bernard Byrd, male    DOB: 1930-10-04, 83 y.o.   MRN: [id number]  HPI  83 year old male who  has a past medical history of CAD (coronary artery disease); CVA (cerebral vascular accident) ([place]); Diverticulosis of rectosigmoid; DVT (deep venous thrombosis) ([place]) (2009); Groin pain, left lower quadrant; Hyperlipidemia; Hypertension; Melanoma ([place]); MELANOMA, HX OF (12/02/2008); MI (myocardial infarction) ([place]) (10/14/2006); Nephrolithiasis; Nuclear sclerotic cataract of both eyes (2015); Osteoarthritis; Prostate nodule; and Retroperitoneal hematoma (2009).  He presents to the office with two acute complaints.   1. He needs bilateral ear irrigation. He has been using mineral oil at home and has been able to remove some cerumen but feels as though it is not completely out  2. He has a red rash on his left leg and and right forearm. He has not changed detergents, soaps, or diet. He has not been working out in the yard. The rash is red and itchy. Has been using OTC Cortisone cream without resolution. He denies any pain or weeping.    Review of Systems See HPI   Past Medical History:  Diagnosis Date  . CAD (coronary artery disease)    CABG 2008:  myoview  2010 showed no ischemia and a normal EF.  Nuclear study 11/2011: EF 55%. There was a prior inferior basal infarct but no ischemia.  07/2015 no change on repeat nuclear study.  . CVA (cerebral vascular accident) ([place])    possible/no residual deficit--following CABG   . Diverticulosis of rectosigmoid    Noted on CT 04/2011 done to eval chronic groin pain  . DVT (deep venous thrombosis) ([place]) 2009   Post-op from laparoscopic inguinal hernia repair  . Groin pain, left lower quadrant    Chronic, intermittent: believed to be due to scarring around MESH placed at inguinal hernia repair  . Hyperlipidemia   . Hypertension   . Melanoma ([place])    skin graft surgery required  . MELANOMA, HX OF 12/02/2008  . MI (myocardial  infarction) ([place]) 10/14/2006   Inferior.    . Nephrolithiasis   . Nuclear sclerotic cataract of both eyes 2015  . Osteoarthritis    Primarily hips  . Prostate nodule    multiple benign biopsies (Dr Rosana Hoes)  . Retroperitoneal hematoma 2009   on lovenox; greenfield filter placed after this    Social History   Social History  . Marital status: Married    Spouse name: N/A  . Number of children: N/A  . Years of education: N/A   Occupational History  . Not on file.   Social History Main Topics  . Smoking status: Never Smoker  . Smokeless tobacco: Never Used  . Alcohol use No  . Drug use: No  . Sexual activity: Not on file   Other Topics Concern  . 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       Past Surgical History:  Procedure Laterality Date  . BILATERAL ANTERIOR TOTAL HIP ARTHROPLASTY     One in 2004 and one in 2011  . brain hemorrhage     at age 24  . CARDIOVASCULAR STRESS TEST  11/2011; 07/2015   2013 Nuclear study: EF 55%. There was a prior inferior basal infarct but no ischemia.  No change 2016.  . carpal tunnel repair B Bilateral   . CATARACT EXTRACTION    .  COLONOSCOPY W/ POLYPECTOMY  02/07/09   Adenomatous polyps, severe diverticulosis, internal hemorrhoids.  Repeat 2015---however, pt declines any further colon cancer screening  . greenfield filter    . HERNIA REPAIR  05/20/2008   open, X 3  . kidney stone removal  01/18/1988   x 5   . MELANOMA EXCISION  08/1990   left shoulder - skin graft from leg  . ROTATOR CUFF REPAIR Bilateral   . status post coronary bypass graft  10/14/06    (LIMA to the LAD, saphenous vein graft to the diagonal, saphenous vein graft to the mid obtuse marginal, saphenous vein graft placed in a sequential fashion ot the RV branch of the right coronary artery and distal right coronary artery  . trigger finger X 3      Family History  Problem  Relation Age of Onset  . Cancer Mother 89       uterine and ovarian   . Heart attack Father 81  . Heart disease Father   . Heart disease Brother     Allergies  Allergen Reactions  . Clindamycin Palmitate Hcl Itching    Difficulty breathing  . Clindamycin/Lincomycin Itching and Hives    Difficulty breathing  . Penicillins Hives and Rash  . Sulfa Antibiotics Hives  . Other Hives and Rash  . Sulfonamide Derivatives Hives and Rash    Current Outpatient Prescriptions on File Prior to Visit  Medication Sig Dispense Refill  . amLODipine (NORVASC) 10 MG tablet Take 1 tablet (10 mg total) by mouth daily. 90 tablet 3  . aspirin 325 MG tablet Take 325 mg by mouth daily.      Marland Kitchen docusate sodium (COLACE) 50 MG capsule Take 100 mg by mouth 2 (two) times daily. Reported on 11/29/2015    . ibuprofen (ADVIL,MOTRIN) 800 MG tablet Take 1 tablet (800 mg total) by mouth daily. 90 tablet 0  . metoprolol (LOPRESSOR) 50 MG tablet Take 1 tablet (50 mg total) by mouth 2 (two) times daily. 60 tablet 11  . Multiple Vitamin (MULTIVITAMIN) tablet Take 1 tablet by mouth daily.      . [DISCONTINUED] fexofenadine (ALLEGRA) 180 MG tablet Take 1 tablet (180 mg total) by mouth daily. 30 tablet 11  . [DISCONTINUED] furosemide (LASIX) 20 MG tablet Take 20 mg by mouth as needed.       No current facility-administered medications on file prior to visit.     BP 118/70 (BP Location: Left Arm, Patient Position: Sitting, Cuff Size: Normal)   Temp 98.6 F (37 C) (Oral)   Ht 5\' 11"  (1.803 m)   Wt 224 lb 4.8 oz (101.7 kg)   BMI 31.28 kg/m       Objective:   Physical Exam  Constitutional: He is oriented to person, place, and time. He appears well-developed and well-nourished. No distress.  HENT:  Bilateral cerumen impaction   Cardiovascular: Normal rate, regular rhythm, normal heart sounds and intact distal pulses.  Exam reveals no gallop and no friction rub.   No murmur heard. Pulmonary/Chest: Effort normal and  breath sounds normal. No respiratory distress. He has no wheezes.  Neurological: He is alert and oriented to person, place, and time.  Skin: Skin is warm and dry. Rash (red raised rash throughout left leg and right arm. No wounds present. No weeping noted. ) noted. He is not diaphoretic.  Psychiatric: He has a normal mood and affect. His behavior is normal. Judgment and thought content normal.  Vitals reviewed.  Assessment & Plan:  1. Bilateral impacted cerumen - Bilateral cerumen impaction was easily removed with irrigation. No signs of infection or trauma   2. Contact dermatitis, unspecified contact dermatitis type, unspecified trigger  - predniSONE (DELTASONE) 10 MG tablet; 40 mg x 3 days, 20 mg x 3 days, 10 mg x 3 days  Dispense: 21 tablet; Refill: 0 - triamcinolone cream (KENALOG) 0.1 %; Apply 1 application topically 2 (two) times daily.  Dispense: 30 g; Refill: 0  - Follow up if not resolved in  7-10 days   Dorothyann Peng, NP

## 2017-02-03 DIAGNOSIS — B354 Tinea corporis: Secondary | ICD-10-CM | POA: Diagnosis not present

## 2017-02-21 ENCOUNTER — Encounter: Payer: Self-pay | Admitting: Adult Health

## 2017-02-21 ENCOUNTER — Ambulatory Visit (INDEPENDENT_AMBULATORY_CARE_PROVIDER_SITE_OTHER): Payer: Medicare Other | Admitting: Adult Health

## 2017-02-21 VITALS — BP 116/72 | Temp 98.0°F | Ht 71.0 in | Wt 223.2 lb

## 2017-02-21 DIAGNOSIS — B372 Candidiasis of skin and nail: Secondary | ICD-10-CM | POA: Diagnosis not present

## 2017-02-21 DIAGNOSIS — I25810 Atherosclerosis of coronary artery bypass graft(s) without angina pectoris: Secondary | ICD-10-CM

## 2017-02-21 LAB — POCT GLYCOSYLATED HEMOGLOBIN (HGB A1C): Hemoglobin A1C: 5.5

## 2017-02-21 NOTE — Progress Notes (Signed)
Subjective:    Patient ID: Bernard Byrd, male    DOB: 1931/07/12, 83 y.o.   MRN: [id number]  HPI  83 year old male who  has a past medical history of CAD (coronary artery disease); CVA (cerebral vascular accident) ([place]); Diverticulosis of rectosigmoid; DVT (deep venous thrombosis) ([place]) (2009); Groin pain, left lower quadrant; Hyperlipidemia; Hypertension; Melanoma ([place]); MELANOMA, HX OF (12/02/2008); MI (myocardial infarction) ([place]) (10/14/2006); Nephrolithiasis; Nuclear sclerotic cataract of both eyes (2015); Osteoarthritis; Prostate nodule; and Retroperitoneal hematoma (2009).  He presents to the office today for follow up regarding rash on arms and legs. I last saw him on 01/21/2017 and prescribed him prednisone taper and kenalog cream for suspected contact dermatitis. His rash became worse and more pruritic. He went and saw dermatology who diagnosed him with a yeast infection. He is on his second round of diflucan.   Family reports that they believe this rash is improving but the patient does not agree with this statement.   He continues to complain of severe itching, redness and peeling.   He has been using Benadryl and anti itch cream which helps slightly.   Family would like to make sure he is not diabetic   He has follow up with Dermatology in 3 days    Review of Systems See HPI   Past Medical History:  Diagnosis Date  . CAD (coronary artery disease)    CABG 2008:  myoview  2010 showed no ischemia and a normal EF.  Nuclear study 11/2011: EF 55%. There was a prior inferior basal infarct but no ischemia.  07/2015 no change on repeat nuclear study.  . CVA (cerebral vascular accident) ([place])    possible/no residual deficit--following CABG   . Diverticulosis of rectosigmoid    Noted on CT 04/2011 done to eval chronic groin pain  . DVT (deep venous thrombosis) ([place]) 2009   Post-op from laparoscopic inguinal hernia repair  . Groin pain, left lower quadrant    Chronic, intermittent:  believed to be due to scarring around MESH placed at inguinal hernia repair  . Hyperlipidemia   . Hypertension   . Melanoma ([place])    skin graft surgery required  . MELANOMA, HX OF 12/02/2008  . MI (myocardial infarction) ([place]) 10/14/2006   Inferior.    . Nephrolithiasis   . Nuclear sclerotic cataract of both eyes 2015  . Osteoarthritis    Primarily hips  . Prostate nodule    multiple benign biopsies (Dr Rosana Hoes)  . Retroperitoneal hematoma 2009   on lovenox; greenfield filter placed after this    Social History   Social History  . Marital status: Married    Spouse name: N/A  . Number of children: N/A  . Years of education: N/A   Occupational History  . Not on file.   Social History Main Topics  . Smoking status: Never Smoker  . Smokeless tobacco: Never Used  . Alcohol use No  . Drug use: No  . Sexual activity: Not on file   Other Topics Concern  . 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       Past Surgical History:  Procedure Laterality Date  . BILATERAL ANTERIOR TOTAL HIP ARTHROPLASTY     One in 2004 and one in 2011  . brain hemorrhage     at age 55  . CARDIOVASCULAR STRESS TEST  11/2011; 07/2015   2013 Nuclear study: EF 55%. There was a prior inferior basal infarct but no ischemia.  No change 2016.  . carpal tunnel repair B Bilateral   . CATARACT EXTRACTION    . COLONOSCOPY W/ POLYPECTOMY  02/07/09   Adenomatous polyps, severe diverticulosis, internal hemorrhoids.  Repeat 2015---however, pt declines any further colon cancer screening  . greenfield filter    . HERNIA REPAIR  05/20/2008   open, X 3  . kidney stone removal  01/18/1988   x 5   . MELANOMA EXCISION  08/1990   left shoulder - skin graft from leg  . ROTATOR CUFF REPAIR Bilateral   . status post coronary bypass graft  10/14/06    (LIMA to the LAD, saphenous vein graft to the diagonal, saphenous vein graft to  the mid obtuse marginal, saphenous vein graft placed in a sequential fashion ot the RV branch of the right coronary artery and distal right coronary artery  . trigger finger X 3      Family History  Problem Relation Age of Onset  . Cancer Mother 31       uterine and ovarian   . Heart attack Father 54  . Heart disease Father   . Heart disease Brother     Allergies  Allergen Reactions  . Clindamycin Palmitate Hcl Itching    Difficulty breathing  . Clindamycin/Lincomycin Itching and Hives    Difficulty breathing  . Penicillins Hives and Rash  . Sulfa Antibiotics Hives  . Other Hives and Rash  . Sulfonamide Derivatives Hives and Rash    Current Outpatient Prescriptions on File Prior to Visit  Medication Sig Dispense Refill  . amLODipine (NORVASC) 10 MG tablet Take 1 tablet (10 mg total) by mouth daily. 90 tablet 3  . aspirin 325 MG tablet Take 325 mg by mouth daily.      Marland Kitchen docusate sodium (COLACE) 50 MG capsule Take 100 mg by mouth 2 (two) times daily. Reported on 11/29/2015    . ibuprofen (ADVIL,MOTRIN) 800 MG tablet Take 1 tablet (800 mg total) by mouth daily. 90 tablet 0  . metoprolol (LOPRESSOR) 50 MG tablet Take 1 tablet (50 mg total) by mouth 2 (two) times daily. 60 tablet 11  . Multiple Vitamin (MULTIVITAMIN) tablet Take 1 tablet by mouth daily.      . [DISCONTINUED] fexofenadine (ALLEGRA) 180 MG tablet Take 1 tablet (180 mg total) by mouth daily. 30 tablet 11  . [DISCONTINUED] furosemide (LASIX) 20 MG tablet Take 20 mg by mouth as needed.       No current facility-administered medications on file prior to visit.     BP 116/72 (BP Location: Left Arm, Patient Position: Sitting, Cuff Size: Normal)   Temp 98 F (36.7 C) (Oral)   Ht 5\' 11"  (1.803 m)   Wt 223 lb 3.2 oz (101.2 kg)   BMI 31.13 kg/m       Objective:   Physical Exam  Constitutional: He is oriented to person, place, and time. He appears well-developed and well-nourished. No distress.  Cardiovascular:  Normal rate, regular rhythm, normal heart sounds and intact distal pulses.  Exam reveals no gallop and no friction rub.   No murmur heard. Pulmonary/Chest: Effort normal and breath sounds normal. No respiratory distress. He has no wheezes. He has no rales. He exhibits no tenderness.  Neurological: He is alert and oriented to person, place, and time.  Skin: Skin is warm and dry. Rash noted. He is not diaphoretic. There  is erythema. No pallor.  Red scaly rash with severe peeling skin throughout arms and legs    Psychiatric: He has a normal mood and affect. His behavior is normal. Judgment and thought content normal.  Nursing note and vitals reviewed.     Assessment & Plan:  1. Candidiasis of skin - POC HgB A1c- 5.5 - Follow up with Dermatology as directed    Dorothyann Peng, NP

## 2017-02-24 DIAGNOSIS — B354 Tinea corporis: Secondary | ICD-10-CM | POA: Diagnosis not present

## 2017-03-10 DIAGNOSIS — B354 Tinea corporis: Secondary | ICD-10-CM | POA: Diagnosis not present

## 2017-04-06 ENCOUNTER — Other Ambulatory Visit: Payer: Self-pay | Admitting: Adult Health

## 2017-04-08 ENCOUNTER — Other Ambulatory Visit: Payer: Self-pay

## 2017-04-08 MED ORDER — METOPROLOL TARTRATE 50 MG PO TABS
50.0000 mg | ORAL_TABLET | Freq: Two times a day (BID) | ORAL | 11 refills | Status: DC
Start: 2017-04-08 — End: 2018-03-26

## 2017-04-08 NOTE — Telephone Encounter (Signed)
Ok to refill for one year  

## 2017-04-08 NOTE — Telephone Encounter (Signed)
Last filled 01/09/17 #90 Last seen 02/21/17 for candidiasis of skin No future appt scheduled at this time. Please advise.  Thanks!!

## 2017-04-08 NOTE — Telephone Encounter (Signed)
Sent to the pharmacy by e-scribe for 1 year as instructed.

## 2017-04-22 DIAGNOSIS — Z23 Encounter for immunization: Secondary | ICD-10-CM | POA: Diagnosis not present

## 2017-04-25 DIAGNOSIS — S0501XA Injury of conjunctiva and corneal abrasion without foreign body, right eye, initial encounter: Secondary | ICD-10-CM | POA: Diagnosis not present

## 2017-04-30 DIAGNOSIS — H04123 Dry eye syndrome of bilateral lacrimal glands: Secondary | ICD-10-CM | POA: Diagnosis not present

## 2017-05-13 DIAGNOSIS — L570 Actinic keratosis: Secondary | ICD-10-CM | POA: Diagnosis not present

## 2017-05-13 DIAGNOSIS — L821 Other seborrheic keratosis: Secondary | ICD-10-CM | POA: Diagnosis not present

## 2017-05-13 DIAGNOSIS — Z23 Encounter for immunization: Secondary | ICD-10-CM | POA: Diagnosis not present

## 2017-05-13 DIAGNOSIS — B351 Tinea unguium: Secondary | ICD-10-CM | POA: Diagnosis not present

## 2017-05-13 DIAGNOSIS — Z85828 Personal history of other malignant neoplasm of skin: Secondary | ICD-10-CM | POA: Diagnosis not present

## 2017-05-13 DIAGNOSIS — L814 Other melanin hyperpigmentation: Secondary | ICD-10-CM | POA: Diagnosis not present

## 2017-05-13 DIAGNOSIS — Z85820 Personal history of malignant melanoma of skin: Secondary | ICD-10-CM | POA: Diagnosis not present

## 2017-05-13 DIAGNOSIS — D1800 Hemangioma unspecified site: Secondary | ICD-10-CM | POA: Diagnosis not present

## 2017-05-13 DIAGNOSIS — D225 Melanocytic nevi of trunk: Secondary | ICD-10-CM | POA: Diagnosis not present

## 2017-06-16 DIAGNOSIS — M47817 Spondylosis without myelopathy or radiculopathy, lumbosacral region: Secondary | ICD-10-CM | POA: Diagnosis not present

## 2017-06-16 DIAGNOSIS — M545 Low back pain: Secondary | ICD-10-CM | POA: Diagnosis not present

## 2017-06-16 DIAGNOSIS — H18831 Recurrent erosion of cornea, right eye: Secondary | ICD-10-CM | POA: Diagnosis not present

## 2017-07-08 DIAGNOSIS — M47817 Spondylosis without myelopathy or radiculopathy, lumbosacral region: Secondary | ICD-10-CM | POA: Diagnosis not present

## 2017-07-08 DIAGNOSIS — M545 Low back pain: Secondary | ICD-10-CM | POA: Diagnosis not present

## 2017-07-08 DIAGNOSIS — M48061 Spinal stenosis, lumbar region without neurogenic claudication: Secondary | ICD-10-CM | POA: Diagnosis not present

## 2017-09-29 ENCOUNTER — Other Ambulatory Visit: Payer: Self-pay | Admitting: Cardiology

## 2017-09-29 NOTE — Telephone Encounter (Signed)
REFILL 

## 2017-09-30 ENCOUNTER — Ambulatory Visit: Payer: Medicare Other | Admitting: Physical Therapy

## 2017-10-14 ENCOUNTER — Ambulatory Visit: Payer: Medicare Other | Attending: Orthopedic Surgery | Admitting: Physical Therapy

## 2017-10-14 ENCOUNTER — Encounter: Payer: Self-pay | Admitting: Physical Therapy

## 2017-10-14 ENCOUNTER — Other Ambulatory Visit: Payer: Self-pay

## 2017-10-14 DIAGNOSIS — M62830 Muscle spasm of back: Secondary | ICD-10-CM | POA: Diagnosis not present

## 2017-10-14 DIAGNOSIS — M542 Cervicalgia: Secondary | ICD-10-CM | POA: Diagnosis not present

## 2017-10-14 DIAGNOSIS — R293 Abnormal posture: Secondary | ICD-10-CM | POA: Insufficient documentation

## 2017-10-15 NOTE — Therapy (Signed)
[facility] [address] [place], [place], [address] Phone: [phone]   Fax:  [phone]  Physical Therapy Evaluation  Patient Details  Name: Bernard Byrd MRN: [id number] Date of Birth: 11-15-1930 Referring Provider: Marchia Bond, MD    Encounter Date: 10/14/2017  PT End of Session - 10/14/17 1700    Visit Number  1    Date for PT Re-Evaluation  12/12/17    Authorization Type  Medicare     Authorization - Visit Number  1    Authorization - Number of Visits  15 KX    PT Start Time  0160    PT Stop Time  1093    PT Time Calculation (min)  42 min    Activity Tolerance  Patient tolerated treatment well;No increased pain    Behavior During Therapy  WFL for tasks assessed/performed       Past Medical History:  Diagnosis Date  . CAD (coronary artery disease)    CABG 2008:  myoview  2010 showed no ischemia and a normal EF.  Nuclear study 11/2011: EF 55%. There was a prior inferior basal infarct but no ischemia.  07/2015 no change on repeat nuclear study.  . CVA (cerebral vascular accident) ([place])    possible/no residual deficit--following CABG   . Diverticulosis of rectosigmoid    Noted on CT 04/2011 done to eval chronic groin pain  . DVT (deep venous thrombosis) ([place]) 2009   Post-op from laparoscopic inguinal hernia repair  . Groin pain, left lower quadrant    Chronic, intermittent: believed to be due to scarring around MESH placed at inguinal hernia repair  . Hyperlipidemia   . Hypertension   . Melanoma ([place])    skin graft surgery required  . MELANOMA, HX OF 12/02/2008  . MI (myocardial infarction) ([place]) 10/14/2006   Inferior.    . Nephrolithiasis   . Nuclear sclerotic cataract of both eyes 2015  . Osteoarthritis    Primarily hips  . Prostate nodule    multiple benign biopsies (Dr Rosana Hoes)  . Retroperitoneal hematoma 2009   on lovenox; greenfield filter placed after this    Past Surgical History:   Procedure Laterality Date  . BILATERAL ANTERIOR TOTAL HIP ARTHROPLASTY     One in 2004 and one in 2011  . brain hemorrhage     at age 51  . CARDIOVASCULAR STRESS TEST  11/2011; 07/2015   2013 Nuclear study: EF 55%. There was a prior inferior basal infarct but no ischemia.  No change 2016.  . carpal tunnel repair B Bilateral   . CATARACT EXTRACTION    . COLONOSCOPY W/ POLYPECTOMY  02/07/09   Adenomatous polyps, severe diverticulosis, internal hemorrhoids.  Repeat 2015---however, pt declines any further colon cancer screening  . greenfield filter    . HERNIA REPAIR  05/20/2008   open, X 3  . kidney stone removal  01/18/1988   x 5   . MELANOMA EXCISION  08/1990   left shoulder - skin graft from leg  . ROTATOR CUFF REPAIR Bilateral   . status post coronary bypass graft  10/14/06    (LIMA to the LAD, saphenous vein graft to the diagonal, saphenous vein graft to the mid obtuse marginal, saphenous vein graft placed in a sequential fashion ot the RV branch of the right coronary artery and distal right coronary artery  . trigger finger X 3      There were no vitals filed for this visit.  Subjective Assessment - 10/14/17 1630    Subjective  Pt reports that his neck started bothering him about 3 months ago. He does not know of any specific cause. Overall, it isn't worse or better, but will usually bother him when he is turning his neck certain directions.     Pertinent History  CAD, CABG, CVA, melanoma, osteoarthritis, B THA, B RTC repairs     Patient Stated Goals  improve stiffness and decrease pain    Currently in Pain?  Yes    Pain Score  2     Pain Location  Neck    Pain Orientation  Right    Pain Descriptors / Indicators  Aching;Other (Comment) sometimes sharp    Pain Type  Chronic pain    Pain Radiating Towards  none     Pain Onset  More than a month ago    Pain Frequency  Intermittent    Aggravating Factors   turning his head or looking up    Pain Relieving Factors  heat  (temporary), avoiding irritating movements    Effect of Pain on Daily Activities  difficulty looking when driving         [facility] PT Assessment - 10/15/17 0001      Assessment   Medical Diagnosis  Cervicalgia    Referring Provider  Marchia Bond, MD     Onset Date/Surgical Date  -- ~3 months ago    Prior Therapy  none       Precautions   Precautions  None      Balance Screen   Has the patient fallen in the past 6 months  Yes    How many times?  -- multiple times, out of nowhere    Has the patient had a decrease in activity level because of a fear of falling?   Yes    Is the patient reluctant to leave their home because of a fear of falling?   No      Prior Function   Level of Independence  Requires assistive device for independence      Observation/Other Assessments   Focus on Therapeutic Outcomes (FOTO)   56% limited       Sensation   Additional Comments  Pt denies any numbness and tingling      Posture/Postural Control   Posture Comments  forward head, rounded shoulders       AROM   Cervical Extension  20 deg pain upper cervical region    Cervical - Right Side Bend  15 deg pain mid/upper cervical spine    Cervical - Left Side Bend  15 pain mid/upper cervical spine    Cervical - Right Rotation  20 pain Rt side of the neck    Cervical - Left Rotation  35 pain Rt side of the neck      Palpation   Spinal mobility  hypomobile all directions cervical and thoracic spine     Palpation comment  tenderness along Rt cervical spine and paraspinals.             Objective measurements completed on examination: See above findings.              PT Education - 10/14/17 1659    Education provided  Yes    Education Details  eval findings/POC; benefits of improving posture and cervical alignment     Person(s) Educated  Patient    Methods  Explanation    Comprehension  Verbalized understanding  PT Short Term Goals - 10/15/17 1329      PT SHORT TERM GOAL  #1   Title  Pt will demo consistency and independence with his HEP to improve cervical ROM and decrease pain.    Time  4    Period  Weeks    Status  New    Target Date  11/11/17      PT SHORT TERM GOAL #2   Title  Pt will report atleast 20% improvement in his pain and stiffness from the start of PT.     Time  4    Period  Weeks    Status  New      PT SHORT TERM GOAL #3   Title  Pt will demo proper set up and use of a towell roll for additional lumbar support and improved posture and alignment throughout the day.    Time  4    Period  Weeks    Status  New      PT SHORT TERM GOAL #4   Title  Pt will demo proper log roll technique during his sessions, without the need for additional cuing, in order to decrease strain to the neck and back.    Time  4    Period  Weeks        PT Long Term Goals - 10/15/17 1332      PT LONG TERM GOAL #1   Title  Pt will demo improved active cervical rotation to atleast 45 deg Lt and Rt which will allow him to turn his head with less difficulty while driving.    Time  8    Period  Weeks    Status  New      PT LONG TERM GOAL #2   Title  Pt will demo improved cervical extension to atleast 40 deg which will assist with his ability to look overhead when changing a lightbulb at home.    Time  8    Period  Weeks    Status  New      PT LONG TERM GOAL #3   Title  Pt will report atleast 50% improvement in his pain from the start of PT, to improve his quality of life with daily activity around the house.    Time  8    Period  Weeks    Status  New      PT LONG TERM GOAL #4   Title  Pt will score no greater than 42% limitation on FOTO to reflect improvements in functional activity and limitation.     Time  8    Period  Weeks    Status  New             Plan - 10/15/17 1336    Clinical Impression Statement  Pt is an 84 y.o M referred to OPPT with complaints of neck pain and stiffness onset insidiously 3 months ago. He is overall deconditioned,  using a rollator for mobility, and has a lengthy past medical history including bilateral rotator cuff repairs. He demonstrates poor posture of forward head and rounded shoulders as well as significant limitations in cervical ROM, with no more than 35 deg of rotation both directions and no more than 20 deg of extension. In addition, he has pain with active and passive cervical ROM. He would benefit from skilled PT to address his limitations in ROM, strength and body mechanics to improve his independence with daily activity and decrease pain throughout the  day.    History and Personal Factors relevant to plan of care:  CVA, melanoma, B RTC repairs     Clinical Presentation  Stable    Clinical Presentation due to:  no change    Clinical Decision Making  Low    Rehab Potential  Good    PT Frequency  2x / week    PT Duration  8 weeks    PT Treatment/Interventions  ADLs/Self Care Home Management;Cryotherapy;Electrical Stimulation;Traction;Moist Heat;Functional mobility training;Therapeutic activities;Therapeutic exercise;Patient/family education;Neuromuscular re-education;Manual techniques;Taping;Dry needling;Passive range of motion    PT Next Visit Plan  provide HEP for cervical ROM/stretches; gentle cervical mobilizations; log roll and towel roll     PT Home Exercise Plan  reclined pillow adjustment to encourage extension; need more next session    Consulted and Agree with Plan of Care  Patient       Patient will benefit from skilled therapeutic intervention in order to improve the following deficits and impairments:  Impaired flexibility, Impaired UE functional use, Hypomobility, Decreased range of motion, Decreased endurance, Decreased strength, Decreased mobility, Increased muscle spasms, Postural dysfunction, Pain, Improper body mechanics  Visit Diagnosis: Cervicalgia  Muscle spasm of back  Abnormal posture     Problem List Patient Active Problem List   Diagnosis Date Noted  .  Encounter for Medicare annual wellness exam 06/11/2016  . Chest pain 07/19/2015  . H/O total hip arthroplasty 12/12/2014  . Kidney stone 08/08/2014  . Renal colic 27/74/1287  . Nephrolithiasis 08/07/2014  . Hepatic steatosis 08/07/2014  . Cholelithiasis 08/07/2014  . CAD (coronary artery disease) 09/15/2013  . Cervical spine pain 09/11/2012  . 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  . Health maintenance examination 09/14/2011  . Hyperlipidemia 09/14/2011  . HTN (hypertension), benign 09/14/2011  . Macrocytosis without anemia 09/14/2011  . DYSPNEA 07/26/2009  . LEG PAIN, LEFT 03/16/2009  . OSTEOARTHRITIS, HIP, RIGHT 12/02/2008    1:49 PM,10/15/17 Sherol Dade PT, DPT [facility] [address] [place], [place], [address] Phone: [phone]   Fax:  [phone]  Name: Bernard Byrd MRN: [id number] Date of Birth: 03-04-1931

## 2017-10-16 ENCOUNTER — Encounter: Payer: Self-pay | Admitting: Physical Therapy

## 2017-10-16 ENCOUNTER — Ambulatory Visit: Payer: Medicare Other | Admitting: Physical Therapy

## 2017-10-16 DIAGNOSIS — R293 Abnormal posture: Secondary | ICD-10-CM | POA: Diagnosis not present

## 2017-10-16 DIAGNOSIS — M542 Cervicalgia: Secondary | ICD-10-CM | POA: Diagnosis not present

## 2017-10-16 DIAGNOSIS — M62830 Muscle spasm of back: Secondary | ICD-10-CM | POA: Diagnosis not present

## 2017-10-16 NOTE — Therapy (Signed)
[facility] [address], [place], [place], [address] Phone: [phone]   Fax:  [phone]  Physical Therapy Treatment  Patient Details  Name: Bernard Byrd MRN: [id number] Date of Birth: 03-01-1931 Referring Provider: Marchia Bond, MD    Encounter Date: 10/16/2017  PT End of Session - 10/16/17 1213    Visit Number  2    Date for PT Re-Evaluation  12/12/17    Authorization Type  Medicare     Authorization Time Period  10/14/17 to 12/12/17    Authorization - Visit Number  2    Authorization - Number of Visits  15 KX    PT Start Time  0867    PT Stop Time  6195    PT Time Calculation (min)  40 min    Activity Tolerance  Patient tolerated treatment well;No increased pain    Behavior During Therapy  WFL for tasks assessed/performed       Past Medical History:  Diagnosis Date  . CAD (coronary artery disease)    CABG 2008:  myoview  2010 showed no ischemia and a normal EF.  Nuclear study 11/2011: EF 55%. There was a prior inferior basal infarct but no ischemia.  07/2015 no change on repeat nuclear study.  . CVA (cerebral vascular accident) ([place])    possible/no residual deficit--following CABG   . Diverticulosis of rectosigmoid    Noted on CT 04/2011 done to eval chronic groin pain  . DVT (deep venous thrombosis) ([place]) 2009   Post-op from laparoscopic inguinal hernia repair  . Groin pain, left lower quadrant    Chronic, intermittent: believed to be due to scarring around MESH placed at inguinal hernia repair  . Hyperlipidemia   . Hypertension   . Melanoma ([place])    skin graft surgery required  . MELANOMA, HX OF 12/02/2008  . MI (myocardial infarction) ([place]) 10/14/2006   Inferior.    . Nephrolithiasis   . Nuclear sclerotic cataract of both eyes 2015  . Osteoarthritis    Primarily hips  . Prostate nodule    multiple benign biopsies (Dr Rosana Hoes)  . Retroperitoneal hematoma 2009   on lovenox; greenfield filter  placed after this    Past Surgical History:  Procedure Laterality Date  . BILATERAL ANTERIOR TOTAL HIP ARTHROPLASTY     One in 2004 and one in 2011  . brain hemorrhage     at age 11  . CARDIOVASCULAR STRESS TEST  11/2011; 07/2015   2013 Nuclear study: EF 55%. There was a prior inferior basal infarct but no ischemia.  No change 2016.  . carpal tunnel repair B Bilateral   . CATARACT EXTRACTION    . COLONOSCOPY W/ POLYPECTOMY  02/07/09   Adenomatous polyps, severe diverticulosis, internal hemorrhoids.  Repeat 2015---however, pt declines any further colon cancer screening  . greenfield filter    . HERNIA REPAIR  05/20/2008   open, X 3  . kidney stone removal  01/18/1988   x 5   . MELANOMA EXCISION  08/1990   left shoulder - skin graft from leg  . ROTATOR CUFF REPAIR Bilateral   . status post coronary bypass graft  10/14/06    (LIMA to the LAD, saphenous vein graft to the diagonal, saphenous vein graft to the mid obtuse marginal, saphenous vein graft placed in a sequential fashion ot the RV branch of the right coronary artery and distal right coronary artery  . trigger finger X 3  There were no vitals filed for this visit.  Subjective Assessment - 10/16/17 1151    Subjective  Pt states that his neck is the worst it has every been today. He is not sure that he is able to be fixed.     Pertinent History  CAD, CABG, CVA, melanoma, osteoarthritis, B THA, B RTC repairs     Patient Stated Goals  improve stiffness and decrease pain    Currently in Pain?  Yes    Pain Score  8     Pain Location  Neck    Pain Orientation  Right    Pain Descriptors / Indicators  Aching    Pain Type  Chronic pain    Pain Radiating Towards  none     Pain Onset  More than a month ago    Pain Frequency  Intermittent    Aggravating Factors   just painful    Pain Relieving Factors  heat (temporary), avoiding alot of movement                      [facility] Adult PT Treatment/Exercise - 10/16/17 0001       Exercises   Exercises  Neck      Neck Exercises: Supine   Neck Retraction  15 reps    Neck Retraction Limitations  inclined mat table with towel behind neck    Cervical Rotation  10 reps;Left;Right    Cervical Rotation Limitations  inclined mat table    Other Supine Exercise  scap retraction x15 reps, incline mat      Manual Therapy   Manual Therapy  Joint mobilization;Passive ROM    Manual therapy comments  Pt on inclined mat    Joint Mobilization  Grade II-III CPAs cervical spine x2 bouts, Grade II-III medial glides along cervical spine (Lt and Rt) x2 bouts    Passive ROM  lateral cervical flexion x10 reps each direction, cervical rotation each direction x10 reps             PT Education - 10/16/17 1213    Education provided  Yes    Education Details  HEP implemented; discussed importance of adhering to HEP in order to make progress towards goals and maintain gains made during sessions    Person(s) Educated  Patient    Methods  Explanation;Verbal cues;Handout    Comprehension  Verbalized understanding;Returned demonstration       PT Short Term Goals - 10/15/17 1329      PT SHORT TERM GOAL #1   Title  Pt will demo consistency and independence with his HEP to improve cervical ROM and decrease pain.    Time  4    Period  Weeks    Status  New    Target Date  11/11/17      PT SHORT TERM GOAL #2   Title  Pt will report atleast 20% improvement in his pain and stiffness from the start of PT.     Time  4    Period  Weeks    Status  New      PT SHORT TERM GOAL #3   Title  Pt will demo proper set up and use of a towell roll for additional lumbar support and improved posture and alignment throughout the day.    Time  4    Period  Weeks    Status  New      PT SHORT TERM GOAL #4   Title  Pt  will demo proper log roll technique during his sessions, without the need for additional cuing, in order to decrease strain to the neck and back.    Time  4    Period  Weeks         PT Long Term Goals - 10/15/17 1332      PT LONG TERM GOAL #1   Title  Pt will demo improved active cervical rotation to atleast 45 deg Lt and Rt which will allow him to turn his head with less difficulty while driving.    Time  8    Period  Weeks    Status  New      PT LONG TERM GOAL #2   Title  Pt will demo improved cervical extension to atleast 40 deg which will assist with his ability to look overhead when changing a lightbulb at home.    Time  8    Period  Weeks    Status  New      PT LONG TERM GOAL #3   Title  Pt will report atleast 50% improvement in his pain from the start of PT, to improve his quality of life with daily activity around the house.    Time  8    Period  Weeks    Status  New      PT LONG TERM GOAL #4   Title  Pt will score no greater than 42% limitation on FOTO to reflect improvements in functional activity and limitation.     Time  8    Period  Weeks    Status  New            Plan - 10/16/17 1234    Clinical Impression Statement  Pt arrived with increased pain in the neck today and overall discouraged by his physical condition. Therapist was able to provide encouragement and educate pt on the benefits of consistent HEP adherence. Completed manual treatment to the cervical spine to improve mobility and decrease pain, and provided gentle cervical ROM exercises for home. End of session, pt reported decrease in neck pain from 8/10 down to 5/10. Will continue with current POC.    Rehab Potential  Good    PT Frequency  2x / week    PT Duration  8 weeks    PT Treatment/Interventions  ADLs/Self Care Home Management;Cryotherapy;Electrical Stimulation;Traction;Moist Heat;Functional mobility training;Therapeutic activities;Therapeutic exercise;Patient/family education;Neuromuscular re-education;Manual techniques;Taping;Dry needling;Passive range of motion    PT Next Visit Plan  f/u on HEP adherence; cervical rotation/lateral flexion ROM/stretches; gentle  cervical mobilizations (lateral and central glides); log roll and towel roll     PT Home Exercise Plan  inclined cervical rotation, neck retraction    Consulted and Agree with Plan of Care  Patient       Patient will benefit from skilled therapeutic intervention in order to improve the following deficits and impairments:  Impaired flexibility, Impaired UE functional use, Hypomobility, Decreased range of motion, Decreased endurance, Decreased strength, Decreased mobility, Increased muscle spasms, Postural dysfunction, Pain, Improper body mechanics  Visit Diagnosis: Cervicalgia  Muscle spasm of back  Abnormal posture     Problem List Patient Active Problem List   Diagnosis Date Noted  . Encounter for Medicare annual wellness exam 06/11/2016  . Chest pain 07/19/2015  . H/O total hip arthroplasty 12/12/2014  . Kidney stone 08/08/2014  . Renal colic 33/82/5053  . Nephrolithiasis 08/07/2014  . Hepatic steatosis 08/07/2014  . Cholelithiasis 08/07/2014  . CAD (coronary artery disease) 09/15/2013  .  Cervical spine pain 09/11/2012  . 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  . Health maintenance examination 09/14/2011  . Hyperlipidemia 09/14/2011  . HTN (hypertension), benign 09/14/2011  . Macrocytosis without anemia 09/14/2011  . DYSPNEA 07/26/2009  . LEG PAIN, LEFT 03/16/2009  . OSTEOARTHRITIS, HIP, RIGHT 12/02/2008    12:38 PM,10/16/17 Sherol Dade PT, DPT [place] [facility] [address] [place], [place], [address] Phone: [phone]   Fax:  [phone]  Name: Bernard Byrd MRN: [id number] Date of Birth: Aug 23, 1930

## 2017-10-16 NOTE — Patient Instructions (Signed)
   Cervical Rotation  In a supine position with head resting on a pillow, begin with head forward looking at the ceiling. Rotate the head to one side looking at the wall, then return to beginning position slowly.   Repeat to the other side.  x10 reps each side. Do this during commercials       Supine Cervical Retraction into Towel Roll  Patient tucks their chin and pulls their neck back into the towel.  Hold 3 sec, repeat 10x. Complete during commercials.    Bernard Byrd [address], [place], [place] [address] Phone # [phone] Fax [phone]

## 2017-10-21 ENCOUNTER — Ambulatory Visit: Payer: Medicare Other | Attending: Orthopedic Surgery | Admitting: Physical Therapy

## 2017-10-21 ENCOUNTER — Encounter: Payer: Self-pay | Admitting: Physical Therapy

## 2017-10-21 DIAGNOSIS — R293 Abnormal posture: Secondary | ICD-10-CM | POA: Diagnosis not present

## 2017-10-21 DIAGNOSIS — M62830 Muscle spasm of back: Secondary | ICD-10-CM | POA: Diagnosis not present

## 2017-10-21 DIAGNOSIS — M542 Cervicalgia: Secondary | ICD-10-CM | POA: Diagnosis not present

## 2017-10-21 NOTE — Therapy (Signed)
[facility] [address], [place], [place], [address] Phone: [phone]   Fax:  [phone]  Physical Therapy Treatment  Patient Details  Name: Bernard Byrd MRN: [id number] Date of Birth: [birth year] Referring Provider: Marchia Bond, MD    Encounter Date: 10/21/2017  PT End of Session - 10/21/17 1227    Visit Number  3    Date for PT Re-Evaluation  12/12/17    Authorization Type  Medicare     Authorization Time Period  10/14/17 to 12/12/17    Authorization - Visit Number  3    Authorization - Number of Visits  15 KX    PT Start Time  1150    PT Stop Time  1228    PT Time Calculation (min)  38 min    Activity Tolerance  Patient tolerated treatment well;No increased pain    Behavior During Therapy  WFL for tasks assessed/performed       Past Medical History:  Diagnosis Date  . CAD (coronary artery disease)    CABG 2008:  myoview  2010 showed no ischemia and a normal EF.  Nuclear study 11/2011: EF 55%. There was a prior inferior basal infarct but no ischemia.  07/2015 no change on repeat nuclear study.  . CVA (cerebral vascular accident) ([place])    possible/no residual deficit--following CABG   . Diverticulosis of rectosigmoid    Noted on CT 04/2011 done to eval chronic groin pain  . DVT (deep venous thrombosis) ([place]) 2009   Post-op from laparoscopic inguinal hernia repair  . Groin pain, left lower quadrant    Chronic, intermittent: believed to be due to scarring around MESH placed at inguinal hernia repair  . Hyperlipidemia   . Hypertension   . Melanoma ([place])    skin graft surgery required  . MELANOMA, HX OF 12/02/2008  . MI (myocardial infarction) ([place]) 10/14/2006   Inferior.    . Nephrolithiasis   . Nuclear sclerotic cataract of both eyes 2015  . Osteoarthritis    Primarily hips  . Prostate nodule    multiple benign biopsies (Dr Rosana Hoes)  . Retroperitoneal hematoma 2009   on lovenox; greenfield filter placed  after this    Past Surgical History:  Procedure Laterality Date  . BILATERAL ANTERIOR TOTAL HIP ARTHROPLASTY     One in 2004 and one in 2011  . brain hemorrhage     at age 4  . CARDIOVASCULAR STRESS TEST  11/2011; 07/2015   2013 Nuclear study: EF 55%. There was a prior inferior basal infarct but no ischemia.  No change 2016.  . carpal tunnel repair B Bilateral   . CATARACT EXTRACTION    . COLONOSCOPY W/ POLYPECTOMY  02/07/09   Adenomatous polyps, severe diverticulosis, internal hemorrhoids.  Repeat 2015---however, pt declines any further colon cancer screening  . greenfield filter    . HERNIA REPAIR  05/20/2008   open, X 3  . kidney stone removal  01/18/1988   x 5   . MELANOMA EXCISION  08/1990   left shoulder - skin graft from leg  . ROTATOR CUFF REPAIR Bilateral   . status post coronary bypass graft  10/14/06    (LIMA to the LAD, saphenous vein graft to the diagonal, saphenous vein graft to the mid obtuse marginal, saphenous vein graft placed in a sequential fashion ot the RV branch of the right coronary artery and distal right coronary artery  . trigger finger X 3  There were no vitals filed for this visit.  Subjective Assessment - 10/21/17 1151    Subjective  Pt reports that he was a little sore after his last session. He cut back the number of reps that he was completing. His neck is feeling much less sore now.     Pertinent History  CAD, CABG, CVA, melanoma, osteoarthritis, B THA, B RTC repairs     Patient Stated Goals  improve stiffness and decrease pain    Currently in Pain?  No/denies    Pain Onset  More than a month ago                      [facility] Adult PT Treatment/Exercise - 10/21/17 0001      Neck Exercises: Seated   Other Seated Exercise  thoracic extension over lumbar roll x5 reps       Neck Exercises: Supine   Neck Retraction  10 reps    Neck Retraction Limitations  inclined mat table with towel behind neck    Cervical Rotation  10 reps     Cervical Rotation Limitations  inclined mat table, x5 reps with therapist assistance    Other Supine Exercise  scap retraction x15 reps, with therapist overpressure      Manual Therapy   Manual Therapy  Soft tissue mobilization    Manual therapy comments  Pt on inclined mat    Joint Mobilization  Grade II-III CPAs cervical spine x2 bouts, Grade II-III medial glides along cervical spine (Lt and Rt) x2 bouts    Soft tissue mobilization  Rt sub occipital release             PT Education - 10/21/17 1226    Education provided  Yes    Education Details  expectations of soreness following sessions; technique with therex    Person(s) Educated  Patient    Methods  Explanation;Verbal cues;Tactile cues    Comprehension  Verbalized understanding;Returned demonstration       PT Short Term Goals - 10/15/17 1329      PT SHORT TERM GOAL #1   Title  Pt will demo consistency and independence with his HEP to improve cervical ROM and decrease pain.    Time  4    Period  Weeks    Status  New    Target Date  11/11/17      PT SHORT TERM GOAL #2   Title  Pt will report atleast 20% improvement in his pain and stiffness from the start of PT.     Time  4    Period  Weeks    Status  New      PT SHORT TERM GOAL #3   Title  Pt will demo proper set up and use of a towell roll for additional lumbar support and improved posture and alignment throughout the day.    Time  4    Period  Weeks    Status  New      PT SHORT TERM GOAL #4   Title  Pt will demo proper log roll technique during his sessions, without the need for additional cuing, in order to decrease strain to the neck and back.    Time  4    Period  Weeks        PT Long Term Goals - 10/15/17 1332      PT LONG TERM GOAL #1   Title  Pt will demo improved active cervical rotation to  atleast 45 deg Lt and Rt which will allow him to turn his head with less difficulty while driving.    Time  8    Period  Weeks    Status  New      PT  LONG TERM GOAL #2   Title  Pt will demo improved cervical extension to atleast 40 deg which will assist with his ability to look overhead when changing a lightbulb at home.    Time  8    Period  Weeks    Status  New      PT LONG TERM GOAL #3   Title  Pt will report atleast 50% improvement in his pain from the start of PT, to improve his quality of life with daily activity around the house.    Time  8    Period  Weeks    Status  New      PT LONG TERM GOAL #4   Title  Pt will score no greater than 42% limitation on FOTO to reflect improvements in functional activity and limitation.     Time  8    Period  Weeks    Status  New            Plan - 10/21/17 1215    Clinical Impression Statement  Pt is making progress towards his goals, noting improved pain upon arrival. He also reports being able to turn his head to see his wife while sitting in his recliner at home. Session continued with gentle manual techniques and therex to improve pt's ROM and decrease muscle spasm. Educated pt on proper mechanics and expectations of soreness for 1-2 days following his sessions, and he verbalized understanding. Will continue with current POC.    Rehab Potential  Good    PT Frequency  2x / week    PT Duration  8 weeks    PT Treatment/Interventions  ADLs/Self Care Home Management;Cryotherapy;Electrical Stimulation;Traction;Moist Heat;Functional mobility training;Therapeutic activities;Therapeutic exercise;Patient/family education;Neuromuscular re-education;Manual techniques;Taping;Dry needling;Passive range of motion    PT Next Visit Plan  f/u on HEP adherence; cervical rotation/lateral flexion ROM/stretches; gentle cervical mobilizations (lateral and central glides); log roll and towel roll     PT Home Exercise Plan  inclined cervical rotation, neck retraction    Consulted and Agree with Plan of Care  Patient       Patient will benefit from skilled therapeutic intervention in order to improve the  following deficits and impairments:  Impaired flexibility, Impaired UE functional use, Hypomobility, Decreased range of motion, Decreased endurance, Decreased strength, Decreased mobility, Increased muscle spasms, Postural dysfunction, Pain, Improper body mechanics  Visit Diagnosis: Cervicalgia  Muscle spasm of back  Abnormal posture     Problem List Patient Active Problem List   Diagnosis Date Noted  . Encounter for Medicare annual wellness exam 06/11/2016  . Chest pain 07/19/2015  . H/O total hip arthroplasty 12/12/2014  . Kidney stone 08/08/2014  . Renal colic 40/05/2724  . Nephrolithiasis 08/07/2014  . Hepatic steatosis 08/07/2014  . Cholelithiasis 08/07/2014  . CAD (coronary artery disease) 09/15/2013  . Cervical spine pain 09/11/2012  . 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  . Health maintenance examination 09/14/2011  . Hyperlipidemia 09/14/2011  . HTN (hypertension), benign 09/14/2011  . Macrocytosis without anemia 09/14/2011  . DYSPNEA 07/26/2009  . LEG PAIN, LEFT 03/16/2009  . OSTEOARTHRITIS, HIP, RIGHT 12/02/2008   12:28 PM,10/21/17 Sherol Dade PT, DPT [facility] [address], [place], [place], [address] Phone: [phone]   Fax:  [phone]  Name: Bernard Byrd MRN: [id number] Date of Birth: Jan 13, 1931

## 2017-10-23 ENCOUNTER — Encounter: Payer: Self-pay | Admitting: Physical Therapy

## 2017-10-23 ENCOUNTER — Ambulatory Visit: Payer: Medicare Other | Admitting: Physical Therapy

## 2017-10-23 DIAGNOSIS — R293 Abnormal posture: Secondary | ICD-10-CM | POA: Diagnosis not present

## 2017-10-23 DIAGNOSIS — M542 Cervicalgia: Secondary | ICD-10-CM | POA: Diagnosis not present

## 2017-10-23 DIAGNOSIS — M62830 Muscle spasm of back: Secondary | ICD-10-CM

## 2017-10-23 NOTE — Therapy (Signed)
[facility] [address], [place], [place], [address] Phone: [phone]   Fax:  [phone]  Physical Therapy Treatment  Patient Details  Name: Bernard Byrd MRN: [id number] Date of Birth: 03-Sep-1930 Referring Provider: Marchia Bond, MD    Encounter Date: 10/23/2017  PT End of Session - 10/23/17 1226    Visit Number  4    Date for PT Re-Evaluation  12/12/17    Authorization Type  Medicare     Authorization Time Period  10/14/17 to 12/12/17    Authorization - Visit Number  4    Authorization - Number of Visits  15 KX    PT Start Time  6063    PT Stop Time  1226    PT Time Calculation (min)  38 min    Activity Tolerance  Patient tolerated treatment well;No increased pain    Behavior During Therapy  WFL for tasks assessed/performed       Past Medical History:  Diagnosis Date  . CAD (coronary artery disease)    CABG 2008:  myoview  2010 showed no ischemia and a normal EF.  Nuclear study 11/2011: EF 55%. There was a prior inferior basal infarct but no ischemia.  07/2015 no change on repeat nuclear study.  . CVA (cerebral vascular accident) ([place])    possible/no residual deficit--following CABG   . Diverticulosis of rectosigmoid    Noted on CT 04/2011 done to eval chronic groin pain  . DVT (deep venous thrombosis) ([place]) 2009   Post-op from laparoscopic inguinal hernia repair  . Groin pain, left lower quadrant    Chronic, intermittent: believed to be due to scarring around MESH placed at inguinal hernia repair  . Hyperlipidemia   . Hypertension   . Melanoma ([place])    skin graft surgery required  . MELANOMA, HX OF 12/02/2008  . MI (myocardial infarction) ([place]) 10/14/2006   Inferior.    . Nephrolithiasis   . Nuclear sclerotic cataract of both eyes 2015  . Osteoarthritis    Primarily hips  . Prostate nodule    multiple benign biopsies (Dr Rosana Hoes)  . Retroperitoneal hematoma 2009   on lovenox; greenfield filter placed  after this    Past Surgical History:  Procedure Laterality Date  . BILATERAL ANTERIOR TOTAL HIP ARTHROPLASTY     One in 2004 and one in 2011  . brain hemorrhage     at age 41  . CARDIOVASCULAR STRESS TEST  11/2011; 07/2015   2013 Nuclear study: EF 55%. There was a prior inferior basal infarct but no ischemia.  No change 2016.  . carpal tunnel repair B Bilateral   . CATARACT EXTRACTION    . COLONOSCOPY W/ POLYPECTOMY  02/07/09   Adenomatous polyps, severe diverticulosis, internal hemorrhoids.  Repeat 2015---however, pt declines any further colon cancer screening  . greenfield filter    . HERNIA REPAIR  05/20/2008   open, X 3  . kidney stone removal  01/18/1988   x 5   . MELANOMA EXCISION  08/1990   left shoulder - skin graft from leg  . ROTATOR CUFF REPAIR Bilateral   . status post coronary bypass graft  10/14/06    (LIMA to the LAD, saphenous vein graft to the diagonal, saphenous vein graft to the mid obtuse marginal, saphenous vein graft placed in a sequential fashion ot the RV branch of the right coronary artery and distal right coronary artery  . trigger finger X 3  There were no vitals filed for this visit.  Subjective Assessment - 10/23/17 1153    Subjective  Pt    Pertinent History  CAD, CABG, CVA, melanoma, osteoarthritis, B THA, B RTC repairs     Patient Stated Goals  improve stiffness and decrease pain    Currently in Pain?  Yes    Pain Score  3     Pain Location  Neck    Pain Orientation  Right    Pain Descriptors / Indicators  Sore    Pain Type  Chronic pain    Pain Radiating Towards  none    Pain Onset  More than a month ago    Pain Frequency  Intermittent    Aggravating Factors   alot of neck movement    Pain Relieving Factors  heat, some exercises are helping    Effect of Pain on Daily Activities  difficulty looking while driving                      [facility] Adult PT Treatment/Exercise - 10/23/17 0001      Neck Exercises: Seated   Neck  Retraction  15 reps;3 secs;Limitations    Neck Retraction Limitations  incline mat table     Lateral Flexion  Both;5 reps;Limitations    Lateral Flexion Limitations  x2 sets, therapist assistance on incline mat table     Other Seated Exercise  rows with green TB x10 reps    Other Seated Exercise  thoracic rotation x15 reps each; thoracic extension x10 reps       Manual Therapy   Manual therapy comments  isometric cervical rotation 2x5 reps each, 3 sec hold with therapist resistance    Joint Mobilization  Grade II CPAs cervical spine x2 bouts     Passive ROM  AAROM lateral flexion x15 reps each             PT Education - 10/23/17 1226    Education provided  Yes    Education Details  noted improvements in posture and technique with therex    Person(s) Educated  Patient    Methods  Explanation    Comprehension  Verbalized understanding       PT Short Term Goals - 10/15/17 1329      PT SHORT TERM GOAL #1   Title  Pt will demo consistency and independence with his HEP to improve cervical ROM and decrease pain.    Time  4    Period  Weeks    Status  New    Target Date  11/11/17      PT SHORT TERM GOAL #2   Title  Pt will report atleast 20% improvement in his pain and stiffness from the start of PT.     Time  4    Period  Weeks    Status  New      PT SHORT TERM GOAL #3   Title  Pt will demo proper set up and use of a towell roll for additional lumbar support and improved posture and alignment throughout the day.    Time  4    Period  Weeks    Status  New      PT SHORT TERM GOAL #4   Title  Pt will demo proper log roll technique during his sessions, without the need for additional cuing, in order to decrease strain to the neck and back.    Time  4  Period  Weeks        PT Long Term Goals - 10/15/17 1332      PT LONG TERM GOAL #1   Title  Pt will demo improved active cervical rotation to atleast 45 deg Lt and Rt which will allow him to turn his head with less  difficulty while driving.    Time  8    Period  Weeks    Status  New      PT LONG TERM GOAL #2   Title  Pt will demo improved cervical extension to atleast 40 deg which will assist with his ability to look overhead when changing a lightbulb at home.    Time  8    Period  Weeks    Status  New      PT LONG TERM GOAL #3   Title  Pt will report atleast 50% improvement in his pain from the start of PT, to improve his quality of life with daily activity around the house.    Time  8    Period  Weeks    Status  New      PT LONG TERM GOAL #4   Title  Pt will score no greater than 42% limitation on FOTO to reflect improvements in functional activity and limitation.     Time  8    Period  Weeks    Status  New            Plan - 10/23/17 1226    Clinical Impression Statement  Pt continues to report improvements in neck stiffness and pain. He did arrive with 3/10 discomfort on the Rt side of the neck, but was able to complete his exercises with decrease in pain to 1/10 end of session. Noting improved activation of cervical musculature this session evident by his ability to complete chin tucks without the need for therapist cuing. Will continue with current POC.    Rehab Potential  Good    PT Frequency  2x / week    PT Duration  8 weeks    PT Treatment/Interventions  ADLs/Self Care Home Management;Cryotherapy;Electrical Stimulation;Traction;Moist Heat;Functional mobility training;Therapeutic activities;Therapeutic exercise;Patient/family education;Neuromuscular re-education;Manual techniques;Taping;Dry needling;Passive range of motion    PT Next Visit Plan  cervical rotation/lateral flexion ROM/stretches; gentle cervical mobilizations (lateral and central glides); log roll and towel roll     PT Home Exercise Plan  inclined cervical rotation, neck retraction    Consulted and Agree with Plan of Care  Patient       Patient will benefit from skilled therapeutic intervention in order to improve  the following deficits and impairments:  Impaired flexibility, Impaired UE functional use, Hypomobility, Decreased range of motion, Decreased endurance, Decreased strength, Decreased mobility, Increased muscle spasms, Postural dysfunction, Pain, Improper body mechanics  Visit Diagnosis: Cervicalgia  Muscle spasm of back  Abnormal posture     Problem List Patient Active Problem List   Diagnosis Date Noted  . Encounter for Medicare annual wellness exam 06/11/2016  . Chest pain 07/19/2015  . H/O total hip arthroplasty 12/12/2014  . Kidney stone 08/08/2014  . Renal colic 78/67/6720  . Nephrolithiasis 08/07/2014  . Hepatic steatosis 08/07/2014  . Cholelithiasis 08/07/2014  . CAD (coronary artery disease) 09/15/2013  . Cervical spine pain 09/11/2012  . 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  . Health maintenance examination 09/14/2011  . Hyperlipidemia 09/14/2011  . HTN (hypertension), benign 09/14/2011  . Macrocytosis without anemia 09/14/2011  . DYSPNEA 07/26/2009  . LEG PAIN, LEFT 03/16/2009  .  OSTEOARTHRITIS, HIP, RIGHT 12/02/2008    1:43 PM,10/23/17 Sherol Dade PT, DPT [facility] [address], [place], [place], [address] Phone: [phone]   Fax:  [phone]  Name: Bernard Byrd MRN: [id number] Date of Birth: 1930/09/23

## 2017-10-27 ENCOUNTER — Encounter: Payer: Self-pay | Admitting: Physical Therapy

## 2017-10-27 ENCOUNTER — Ambulatory Visit: Payer: Medicare Other | Admitting: Physical Therapy

## 2017-10-27 DIAGNOSIS — R293 Abnormal posture: Secondary | ICD-10-CM

## 2017-10-27 DIAGNOSIS — M542 Cervicalgia: Secondary | ICD-10-CM | POA: Diagnosis not present

## 2017-10-27 DIAGNOSIS — M62830 Muscle spasm of back: Secondary | ICD-10-CM | POA: Diagnosis not present

## 2017-10-27 NOTE — Therapy (Signed)
[facility] [address], [place], [place], [address] Phone: [phone]   Fax:  [phone]  Physical Therapy Treatment  Patient Details  Name: Bernard Byrd MRN: [id number] Date of Birth: 05/17/1931 Referring Provider: Marchia Bond, MD    Encounter Date: 10/27/2017  PT End of Session - 10/27/17 1217    Visit Number  5    Date for PT Re-Evaluation  12/12/17    Authorization Type  Medicare     Authorization Time Period  10/14/17 to 12/12/17    Authorization - Visit Number  5    Authorization - Number of Visits  15 KX    PT Start Time  3976    PT Stop Time  1225    PT Time Calculation (min)  39 min    Activity Tolerance  Patient tolerated treatment well;No increased pain    Behavior During Therapy  WFL for tasks assessed/performed       Past Medical History:  Diagnosis Date  . CAD (coronary artery disease)    CABG 2008:  myoview  2010 showed no ischemia and a normal EF.  Nuclear study 11/2011: EF 55%. There was a prior inferior basal infarct but no ischemia.  07/2015 no change on repeat nuclear study.  . CVA (cerebral vascular accident) ([place])    possible/no residual deficit--following CABG   . Diverticulosis of rectosigmoid    Noted on CT 04/2011 done to eval chronic groin pain  . DVT (deep venous thrombosis) ([place]) 2009   Post-op from laparoscopic inguinal hernia repair  . Groin pain, left lower quadrant    Chronic, intermittent: believed to be due to scarring around MESH placed at inguinal hernia repair  . Hyperlipidemia   . Hypertension   . Melanoma ([place])    skin graft surgery required  . MELANOMA, HX OF 12/02/2008  . MI (myocardial infarction) ([place]) 10/14/2006   Inferior.    . Nephrolithiasis   . Nuclear sclerotic cataract of both eyes 2015  . Osteoarthritis    Primarily hips  . Prostate nodule    multiple benign biopsies (Dr Rosana Hoes)  . Retroperitoneal hematoma 2009   on lovenox; greenfield filter  placed after this    Past Surgical History:  Procedure Laterality Date  . BILATERAL ANTERIOR TOTAL HIP ARTHROPLASTY     One in 2004 and one in 2011  . brain hemorrhage     at age 84  . CARDIOVASCULAR STRESS TEST  11/2011; 07/2015   2013 Nuclear study: EF 55%. There was a prior inferior basal infarct but no ischemia.  No change 2016.  . carpal tunnel repair B Bilateral   . CATARACT EXTRACTION    . COLONOSCOPY W/ POLYPECTOMY  02/07/09   Adenomatous polyps, severe diverticulosis, internal hemorrhoids.  Repeat 2015---however, pt declines any further colon cancer screening  . greenfield filter    . HERNIA REPAIR  05/20/2008   open, X 3  . kidney stone removal  01/18/1988   x 5   . MELANOMA EXCISION  08/1990   left shoulder - skin graft from leg  . ROTATOR CUFF REPAIR Bilateral   . status post coronary bypass graft  10/14/06    (LIMA to the LAD, saphenous vein graft to the diagonal, saphenous vein graft to the mid obtuse marginal, saphenous vein graft placed in a sequential fashion ot the RV branch of the right coronary artery and distal right coronary artery  . trigger finger X 3  There were no vitals filed for this visit.  Subjective Assessment - 10/27/17 1151    Subjective  Pt reports that he is a little sore today. He is working on his exercises at home.     Pertinent History  CAD, CABG, CVA, melanoma, osteoarthritis, B THA, B RTC repairs     Patient Stated Goals  improve stiffness and decrease pain    Currently in Pain?  Yes    Pain Score  6     Pain Location  Neck    Pain Orientation  Right    Pain Descriptors / Indicators  Aching;Sore    Pain Type  Chronic pain    Pain Radiating Towards  none     Pain Onset  More than a month ago    Pain Frequency  Intermittent    Aggravating Factors   alot of neck movement    Pain Relieving Factors  heat, exercises help some    Effect of Pain on Daily Activities  difficulty turning his head         [facility] PT Assessment - 10/27/17  0001      AROM   Cervical - Right Rotation  30    Cervical - Left Rotation  35                  [facility] Adult PT Treatment/Exercise - 10/27/17 0001      Neck Exercises: Machines for Strengthening   UBE (Upper Arm Bike)  L0, x3 min forward, x3 min backward PT present to discuss progress      Neck Exercises: Seated   Other Seated Exercise  scapular retraction and cervical retraction with pool noodle for feedback 2x10 reps     Other Seated Exercise  thoracic extension over pool noodle from T8 up x5 reps each      Manual Therapy   Manual therapy comments  isometric holds x5 reps each for 3 sec: Rt and Lt rotation, Lt and Rt lateral flexion    Joint Mobilization  Rt cervical rotation MWM 2x10 reps (grade III)     Passive ROM  Thoracic rotation Rt/Lt x10 reps each, AAROm             PT Education - 10/27/17 1228    Education provided  Yes    Education Details  improvements in ROM    Person(s) Educated  Patient    Methods  Explanation    Comprehension  Verbalized understanding       PT Short Term Goals - 10/27/17 1231      PT SHORT TERM GOAL #1   Title  Pt will demo consistency and independence with his HEP to improve cervical ROM and decrease pain.    Time  4    Period  Weeks    Status  Achieved      PT SHORT TERM GOAL #2   Title  Pt will report atleast 20% improvement in his pain and stiffness from the start of PT.     Time  4    Period  Weeks    Status  On-going      PT SHORT TERM GOAL #3   Title  Pt will demo proper set up and use of a towell roll for additional lumbar support and improved posture and alignment throughout the day.    Time  4    Period  Weeks    Status  Achieved      PT SHORT TERM GOAL #4  Title  Pt will demo proper log roll technique during his sessions, without the need for additional cuing, in order to decrease strain to the neck and back.    Time  4    Period  Weeks    Status  On-going        PT Long Term Goals - 10/15/17  1332      PT LONG TERM GOAL #1   Title  Pt will demo improved active cervical rotation to atleast 45 deg Lt and Rt which will allow him to turn his head with less difficulty while driving.    Time  8    Period  Weeks    Status  New      PT LONG TERM GOAL #2   Title  Pt will demo improved cervical extension to atleast 40 deg which will assist with his ability to look overhead when changing a lightbulb at home.    Time  8    Period  Weeks    Status  New      PT LONG TERM GOAL #3   Title  Pt will report atleast 50% improvement in his pain from the start of PT, to improve his quality of life with daily activity around the house.    Time  8    Period  Weeks    Status  New      PT LONG TERM GOAL #4   Title  Pt will score no greater than 42% limitation on FOTO to reflect improvements in functional activity and limitation.     Time  8    Period  Weeks    Status  New            Plan - 10/27/17 1228    Clinical Impression Statement  Pt is making progress towards his goals, demonstrating improvements in cervical rotation to the Rt up to 30 deg this visit. Session focused on therex to promote posture and cervical ROM/strength. Pt does demonstrate limited cervical rotation during conversation with the therapist despite his available active ROM. Therapist encouraged him to utilize new ranges of motion despite previous limitations. Pt verbalized understanding of this.     Rehab Potential  Good    PT Frequency  2x / week    PT Duration  8 weeks    PT Treatment/Interventions  ADLs/Self Care Home Management;Cryotherapy;Electrical Stimulation;Traction;Moist Heat;Functional mobility training;Therapeutic activities;Therapeutic exercise;Patient/family education;Neuromuscular re-education;Manual techniques;Taping;Dry needling;Passive range of motion    PT Next Visit Plan  cervical rotation/lateral flexion ROM/stretches; gentle cervical mobilizations (lateral and central glides); log roll and towel  roll     PT Home Exercise Plan  inclined cervical rotation, neck retraction    Consulted and Agree with Plan of Care  Patient       Patient will benefit from skilled therapeutic intervention in order to improve the following deficits and impairments:  Impaired flexibility, Impaired UE functional use, Hypomobility, Decreased range of motion, Decreased endurance, Decreased strength, Decreased mobility, Increased muscle spasms, Postural dysfunction, Pain, Improper body mechanics  Visit Diagnosis: Cervicalgia  Muscle spasm of back  Abnormal posture     Problem List Patient Active Problem List   Diagnosis Date Noted  . Encounter for Medicare annual wellness exam 06/11/2016  . Chest pain 07/19/2015  . H/O total hip arthroplasty 12/12/2014  . Kidney stone 08/08/2014  . Renal colic 71/01/2693  . Nephrolithiasis 08/07/2014  . Hepatic steatosis 08/07/2014  . Cholelithiasis 08/07/2014  . CAD (coronary artery disease) 09/15/2013  .  Cervical spine pain 09/11/2012  . 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  . Health maintenance examination 09/14/2011  . Hyperlipidemia 09/14/2011  . HTN (hypertension), benign 09/14/2011  . Macrocytosis without anemia 09/14/2011  . DYSPNEA 07/26/2009  . LEG PAIN, LEFT 03/16/2009  . OSTEOARTHRITIS, HIP, RIGHT 12/02/2008    12:32 PM,10/27/17 Sherol Dade PT, DPT [place] [facility] [place]  [facility] [address], [place], [place], [address] Phone: [phone]   Fax:  [phone]  Name: JERIAN MORAIS MRN: [id number] Date of Birth: 03-28-1931

## 2017-10-28 ENCOUNTER — Encounter: Payer: BLUE CROSS/BLUE SHIELD | Admitting: Physical Therapy

## 2017-10-30 ENCOUNTER — Encounter: Payer: Self-pay | Admitting: Physical Therapy

## 2017-10-30 ENCOUNTER — Ambulatory Visit: Payer: Medicare Other | Admitting: Physical Therapy

## 2017-10-30 DIAGNOSIS — R293 Abnormal posture: Secondary | ICD-10-CM | POA: Diagnosis not present

## 2017-10-30 DIAGNOSIS — M542 Cervicalgia: Secondary | ICD-10-CM | POA: Diagnosis not present

## 2017-10-30 DIAGNOSIS — M62830 Muscle spasm of back: Secondary | ICD-10-CM | POA: Diagnosis not present

## 2017-10-30 NOTE — Therapy (Signed)
[facility] [address] [place], [place], [address] Phone: [phone]   Fax:  [phone]  Physical Therapy Treatment/Discharge  Patient Details  Name: Bernard Byrd MRN: [id number] Date of Birth: 01/15/1931 Referring Provider: Marchia Bond, MD   Encounter Date: 10/30/2017  PT End of Session - 10/30/17 1155    Visit Number  6    Date for PT Re-Evaluation  12/12/17    Authorization Type  Medicare     Authorization Time Period  10/14/17 to 12/12/17    Authorization - Visit Number  6    Authorization - Number of Visits  15 KX    PT Start Time  1025    PT Stop Time  1219 Pt needed to leave early for another appointment     PT Time Calculation (min)  33 min    Activity Tolerance  Patient tolerated treatment well;No increased pain    Behavior During Therapy  WFL for tasks assessed/performed       Past Medical History:  Diagnosis Date  . CAD (coronary artery disease)    CABG 2008:  myoview  2010 showed no ischemia and a normal EF.  Nuclear study 11/2011: EF 55%. There was a prior inferior basal infarct but no ischemia.  07/2015 no change on repeat nuclear study.  . CVA (cerebral vascular accident) ([place])    possible/no residual deficit--following CABG   . Diverticulosis of rectosigmoid    Noted on CT 04/2011 done to eval chronic groin pain  . DVT (deep venous thrombosis) ([place]) 2009   Post-op from laparoscopic inguinal hernia repair  . Groin pain, left lower quadrant    Chronic, intermittent: believed to be due to scarring around MESH placed at inguinal hernia repair  . Hyperlipidemia   . Hypertension   . Melanoma ([place])    skin graft surgery required  . MELANOMA, HX OF 12/02/2008  . MI (myocardial infarction) ([place]) 10/14/2006   Inferior.    . Nephrolithiasis   . Nuclear sclerotic cataract of both eyes 2015  . Osteoarthritis    Primarily hips  . Prostate nodule    multiple benign biopsies (Dr Rosana Hoes)  .  Retroperitoneal hematoma 2009   on lovenox; greenfield filter placed after this    Past Surgical History:  Procedure Laterality Date  . BILATERAL ANTERIOR TOTAL HIP ARTHROPLASTY     One in 2004 and one in 2011  . brain hemorrhage     at age 84  . CARDIOVASCULAR STRESS TEST  11/2011; 07/2015   2013 Nuclear study: EF 55%. There was a prior inferior basal infarct but no ischemia.  No change 2016.  . carpal tunnel repair B Bilateral   . CATARACT EXTRACTION    . COLONOSCOPY W/ POLYPECTOMY  02/07/09   Adenomatous polyps, severe diverticulosis, internal hemorrhoids.  Repeat 2015---however, pt declines any further colon cancer screening  . greenfield filter    . HERNIA REPAIR  05/20/2008   open, X 3  . kidney stone removal  01/18/1988   x 5   . MELANOMA EXCISION  08/1990   left shoulder - skin graft from leg  . ROTATOR CUFF REPAIR Bilateral   . status post coronary bypass graft  10/14/06    (LIMA to the LAD, saphenous vein graft to the diagonal, saphenous vein graft to the mid obtuse marginal, saphenous vein graft placed in a sequential fashion ot the RV branch of the right coronary artery and distal right coronary artery  . trigger  finger X 3      There were no vitals filed for this visit.  Subjective Assessment - 10/30/17 1158    Subjective  Pt reports that he is ready for this to be his last day. He is working on his exercises at home, but is also noting more soreness in the Rt side of the neck. He is able to turn his head when wanting to look out the window. He feels that he has made as much progress as he can make and is comfortable with completing his HEP on his own moving forward.     Pertinent History  CAD, CABG, CVA, melanoma, osteoarthritis, B THA, B RTC repairs     Patient Stated Goals  improve stiffness and decrease pain    Currently in Pain?  No/denies    Pain Onset  More than a month ago         [facility] PT Assessment - 10/30/17 0001      Assessment   Medical Diagnosis   Cervicalgia    Referring Provider  Marchia Bond, MD    Prior Therapy  none       Precautions   Precautions  None      Balance Screen   Has the patient fallen in the past 6 months  Yes    How many times?  -- multiple times    Has the patient had a decrease in activity level because of a fear of falling?   Yes    Is the patient reluctant to leave their home because of a fear of falling?   No      Prior Function   Level of Independence  Requires assistive device for independence      Observation/Other Assessments   Focus on Therapeutic Outcomes (FOTO)   36% limited       Sensation   Additional Comments  Pt denies numbness/tingling       Posture/Postural Control   Posture Comments  forward head, rounded shoulders       AROM   Cervical Extension  30    Cervical - Right Side Bend  25    Cervical - Left Side Bend  20    Cervical - Right Rotation  35    Cervical - Left Rotation  40      Palpation   Palpation comment  Tenderness along Rt cervical spine column                   [facility] Adult PT Treatment/Exercise - 10/30/17 0001      Neck Exercises: Stretches   Other Neck Stretches  gentle cervical rotation stretch with towel x5 sec each direction for HEP              PT Education - 10/30/17 1202    Education provided  Yes    Education Details  benefits of continuing PT and decreasing frequency but acknowledging that pt can atleast maintain and increase cervical ROM/pain with continued HEP adherence; addition to HEP     Person(s) Educated  Patient    Methods  Explanation;Handout;Verbal cues    Comprehension  Verbalized understanding;Returned demonstration       PT Short Term Goals - 10/30/17 1225      PT SHORT TERM GOAL #1   Title  Pt will demo consistency and independence with his HEP to improve cervical ROM and decrease pain.    Time  4    Period  Weeks    Status  Achieved      PT SHORT TERM GOAL #2   Title  Pt will report atleast 20% improvement  in his pain and stiffness from the start of PT.     Baseline  unable to give percentage, FOTO improved 20%    Time  4    Period  Weeks    Status  Achieved      PT SHORT TERM GOAL #3   Title  Pt will demo proper set up and use of a towell roll for additional lumbar support and improved posture and alignment throughout the day.    Time  4    Period  Weeks    Status  Achieved      PT SHORT TERM GOAL #4   Title  Pt will demo proper log roll technique during his sessions, without the need for additional cuing, in order to decrease strain to the neck and back.    Baseline  pt not consistent with this     Time  4    Period  Weeks    Status  Partially Met        PT Long Term Goals - 10/30/17 1225      PT LONG TERM GOAL #1   Title  Pt will demo improved active cervical rotation to atleast 45 deg Lt and Rt which will allow him to turn his head with less difficulty while driving.    Baseline  35 and 40 deg     Time  8    Period  Weeks    Status  Partially Met      PT LONG TERM GOAL #2   Title  Pt will demo improved cervical extension to atleast 40 deg which will assist with his ability to look overhead when changing a lightbulb at home.    Baseline  30 deg     Time  8    Period  Weeks    Status  Partially Met      PT LONG TERM GOAL #3   Title  Pt will report atleast 50% improvement in his pain from the start of PT, to improve his quality of life with daily activity around the house.    Time  8    Period  Weeks    Status  Not Met      PT LONG TERM GOAL #4   Title  Pt will score no greater than 42% limitation on FOTO to reflect improvements in functional activity and limitation.     Baseline  36% limited     Time  8    Period  Weeks    Status  Achieved            Plan - 10/30/17 1227    Clinical Impression Statement  Pt was discharged this visit, by his request, having met several of his short term goals and making steady progress towards his long term goals. His  cervical rotation ROM has improved from 15 deg to 35deg and 40 deg each direction. His pain has decreased overall, and he reports being able to turn and look out the window without as much difficulty or pain. Therapist recommended decrease in PT frequency rather than d/c due to his improvements he has made in just 6 visits, however he does not feel that he will benefit more from therapy due to the intermittent soreness that he will experience for 24 hours following his sessions. He was encouraged to continue with his HEP moving  forward and he verbalized understanding and agreement with this.     Rehab Potential  Good    PT Frequency  2x / week    PT Duration  8 weeks    PT Treatment/Interventions  ADLs/Self Care Home Management;Cryotherapy;Electrical Stimulation;Traction;Moist Heat;Functional mobility training;Therapeutic activities;Therapeutic exercise;Patient/family education;Neuromuscular re-education;Manual techniques;Taping;Dry needling;Passive range of motion    PT Next Visit Plan  d/c home with HEP    PT Home Exercise Plan  inclined cervical rotation, neck retraction, cervical rotation stretch     Consulted and Agree with Plan of Care  Patient       Patient will benefit from skilled therapeutic intervention in order to improve the following deficits and impairments:  Impaired flexibility, Impaired UE functional use, Hypomobility, Decreased range of motion, Decreased endurance, Decreased strength, Decreased mobility, Increased muscle spasms, Postural dysfunction, Pain, Improper body mechanics  Visit Diagnosis: Cervicalgia  Muscle spasm of back     Problem List Patient Active Problem List   Diagnosis Date Noted  . Encounter for Medicare annual wellness exam 06/11/2016  . Chest pain 07/19/2015  . H/O total hip arthroplasty 12/12/2014  . Kidney stone 08/08/2014  . Renal colic 14/48/1856  . Nephrolithiasis 08/07/2014  . Hepatic steatosis 08/07/2014  . Cholelithiasis 08/07/2014  .  CAD (coronary artery disease) 09/15/2013  . Cervical spine pain 09/11/2012  . 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  . Health maintenance examination 09/14/2011  . Hyperlipidemia 09/14/2011  . HTN (hypertension), benign 09/14/2011  . Macrocytosis without anemia 09/14/2011  . DYSPNEA 07/26/2009  . LEG PAIN, LEFT 03/16/2009  . OSTEOARTHRITIS, HIP, RIGHT 12/02/2008    PHYSICAL THERAPY DISCHARGE SUMMARY  Visits from Start of Care: 6  Current functional level related to goals / functional outcomes: See above for more details    Remaining deficits: See above for more details    Education / Equipment: See above for more details  Plan: Patient agrees to discharge.  Patient goals were partially met. Patient is being discharged due to being pleased with the current functional level.  ?????    1:32 PM,10/30/17 Sherol Dade PT, DPT [place] [facility] [place]  [facility] [address], [place], [place], [address] Phone: [phone]   Fax:  [phone]  Name: LADISLAV CASELLI MRN: [id number] Date of Birth: 11-19-1930

## 2017-10-30 NOTE — Patient Instructions (Signed)
    CERVICAL TOWEL ROTATION STRETCH  Hold the ends of a small folded bath towel and wrap it around your head and neck as shown. Place the towel on your face so as to minimize placing pressure on your jaw. Pressure should be placed on the side of your face/cheek bone.   Use your bottom most arm to anchor the towel in place. Use your top most arm to pull the towel to cause a gentle rotational stretch in your neck. Hold, then return to starting position and repeat.     x5 reps, gentle stretch   [facility] [address] [place], [place] [address] Phone # [phone] Fax [phone]

## 2017-11-04 ENCOUNTER — Encounter: Payer: BLUE CROSS/BLUE SHIELD | Admitting: Physical Therapy

## 2017-11-06 ENCOUNTER — Encounter: Payer: BLUE CROSS/BLUE SHIELD | Admitting: Physical Therapy

## 2017-11-11 ENCOUNTER — Encounter: Payer: BLUE CROSS/BLUE SHIELD | Admitting: Physical Therapy

## 2017-11-11 ENCOUNTER — Other Ambulatory Visit: Payer: Self-pay | Admitting: Cardiology

## 2017-11-13 ENCOUNTER — Encounter: Payer: BLUE CROSS/BLUE SHIELD | Admitting: Physical Therapy

## 2017-11-17 ENCOUNTER — Other Ambulatory Visit: Payer: Self-pay | Admitting: Cardiology

## 2017-11-18 ENCOUNTER — Encounter: Payer: BLUE CROSS/BLUE SHIELD | Admitting: Physical Therapy

## 2017-11-20 ENCOUNTER — Encounter: Payer: BLUE CROSS/BLUE SHIELD | Admitting: Physical Therapy

## 2017-11-25 ENCOUNTER — Encounter: Payer: BLUE CROSS/BLUE SHIELD | Admitting: Physical Therapy

## 2017-11-27 ENCOUNTER — Encounter: Payer: BLUE CROSS/BLUE SHIELD | Admitting: Physical Therapy

## 2017-12-02 ENCOUNTER — Encounter: Payer: BLUE CROSS/BLUE SHIELD | Admitting: Physical Therapy

## 2017-12-04 ENCOUNTER — Encounter: Payer: BLUE CROSS/BLUE SHIELD | Admitting: Physical Therapy

## 2017-12-09 ENCOUNTER — Encounter: Payer: BLUE CROSS/BLUE SHIELD | Admitting: Physical Therapy

## 2017-12-09 NOTE — Progress Notes (Signed)
HPI: FU CAD. H/o inferior MI s/p CABG x 5 in 2/08 w/ ? CVA post CABG. ABIs in August of 2010 were normal. Abdominal CT December 2015 showed no aneurysm. Nuclear study December 2016 showed inferior scar but no ischemia. Ejection fraction 57%. Since I last saw him,  he has some dyspnea on exertion but no orthopnea or PND.  Mild pedal edema.  No chest pain or syncope.  Mild dizziness with standing at times.  Current Outpatient Medications  Medication Sig Dispense Refill  . amLODipine (NORVASC) 10 MG tablet Take 1 tablet (10 mg total) by mouth daily. KEEP OV. 90 tablet 0  . aspirin 325 MG tablet Take 325 mg by mouth daily.      Marland Kitchen atorvastatin (LIPITOR) 10 MG tablet TAKE 1 TABLET (10 MG TOTAL) BY MOUTH DAILY. 30 tablet 2  . docusate sodium (COLACE) 50 MG capsule Take 100 mg by mouth 2 (two) times daily. Reported on 11/29/2015    . ibuprofen (ADVIL,MOTRIN) 800 MG tablet TAKE 1 TABLET (800 MG TOTAL) BY MOUTH DAILY. 90 tablet 3  . metoprolol tartrate (LOPRESSOR) 50 MG tablet Take 1 tablet (50 mg total) by mouth 2 (two) times daily. 60 tablet 11  . Multiple Vitamin (MULTIVITAMIN) tablet Take 1 tablet by mouth daily.       No current facility-administered medications for this visit.      Past Medical History:  Diagnosis Date  . CAD (coronary artery disease)    CABG 2008:  myoview  2010 showed no ischemia and a normal EF.  Nuclear study 11/2011: EF 55%. There was a prior inferior basal infarct but no ischemia.  07/2015 no change on repeat nuclear study.  . CVA (cerebral vascular accident) ([place])    possible/no residual deficit--following CABG   . Diverticulosis of rectosigmoid    Noted on CT 04/2011 done to eval chronic groin pain  . DVT (deep venous thrombosis) ([place]) 2009   Post-op from laparoscopic inguinal hernia repair  . Groin pain, left lower quadrant    Chronic, intermittent: believed to be due to scarring around MESH placed at inguinal hernia repair  . Hyperlipidemia   . Hypertension    . Melanoma ([place])    skin graft surgery required  . MELANOMA, HX OF 12/02/2008  . MI (myocardial infarction) ([place]) 10/14/2006   Inferior.    . Nephrolithiasis   . Nuclear sclerotic cataract of both eyes 2015  . Osteoarthritis    Primarily hips  . Prostate nodule    multiple benign biopsies (Dr Rosana Hoes)  . Retroperitoneal hematoma 2009   on lovenox; greenfield filter placed after this    Past Surgical History:  Procedure Laterality Date  . BILATERAL ANTERIOR TOTAL HIP ARTHROPLASTY     One in 2004 and one in 2011  . brain hemorrhage     at age 73  . CARDIOVASCULAR STRESS TEST  11/2011; 07/2015   2013 Nuclear study: EF 55%. There was a prior inferior basal infarct but no ischemia.  No change 2016.  . carpal tunnel repair B Bilateral   . CATARACT EXTRACTION    . COLONOSCOPY W/ POLYPECTOMY  02/07/09   Adenomatous polyps, severe diverticulosis, internal hemorrhoids.  Repeat 2015---however, pt declines any further colon cancer screening  . greenfield filter    . HERNIA REPAIR  05/20/2008   open, X 3  . kidney stone removal  01/18/1988   x 5   . MELANOMA EXCISION  08/1990   left shoulder - skin  graft from leg  . ROTATOR CUFF REPAIR Bilateral   . status post coronary bypass graft  10/14/06    (LIMA to the LAD, saphenous vein graft to the diagonal, saphenous vein graft to the mid obtuse marginal, saphenous vein graft placed in a sequential fashion ot the RV branch of the right coronary artery and distal right coronary artery  . trigger finger X 3      Social History   Socioeconomic History  . Marital status: Married    Spouse name: Not on file  . Number of children: Not on file  . Years of education: Not on file  . Highest education level: Not on file  Occupational History  . Not on file  Social Needs  . Financial resource strain: Not on file  . Food insecurity:    Worry: Not on file    Inability: Not on file  . Transportation needs:    Medical: Not on file    Non-medical: Not  on file  Tobacco Use  . Smoking status: Never Smoker  . Smokeless tobacco: Never Used  Substance and Sexual Activity  . Alcohol use: No  . Drug use: No  . Sexual activity: Not on file  Lifestyle  . Physical activity:    Days per week: Not on file    Minutes per session: Not on file  . Stress: Not on file  Relationships  . Social connections:    Talks on phone: Not on file    Gets together: Not on file    Attends religious service: Not on file    Active member of club or organization: Not on file    Attends meetings of clubs or organizations: Not on file    Relationship status: Not on file  . Intimate partner violence:    Fear of current or ex partner: Not on file    Emotionally abused: Not on file    Physically abused: Not on file    Forced sexual activity: Not on file  Other Topics Concern  . 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   Retired Risk analyst.   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    Family History  Problem Relation Age of Onset  . Cancer Mother 40       uterine and ovarian   . Heart attack Father 6  . Heart disease Father   . Heart disease Brother     ROS: no fevers or chills, productive cough, hemoptysis, dysphasia, odynophagia, melena, hematochezia, dysuria, hematuria, rash, seizure activity, orthopnea, PND, claudication. Remaining systems are negative.  Physical Exam: Well-developed well-nourished in no acute distress.  Skin is warm and dry.  HEENT is normal.  Neck is supple.  Chest is clear to auscultation with normal expansion.  Cardiovascular exam is regular rate and rhythm. 2/6 SEM Abdominal exam nontender or distended. No masses palpated. Extremities show trace edema. neuro grossly intact  ECG-sinus bradycardia at a rate of 53, PVC, nonspecific ST changes.  Personally reviewed  A/P  1 coronary artery disease-patient doing well with no chest pain.  Continue aspirin and statin.  2 hypertension-blood  pressure is controlled.  Continue present medications.  Check potassium and renal function.   3 hyperlipidemia-continue statin.  He has not tolerated higher doses in the past.  Check lipids and liver.  Kirk Ruths, MD

## 2017-12-10 ENCOUNTER — Ambulatory Visit: Payer: Medicare Other | Admitting: Cardiology

## 2017-12-11 ENCOUNTER — Encounter: Payer: BLUE CROSS/BLUE SHIELD | Admitting: Physical Therapy

## 2017-12-16 ENCOUNTER — Ambulatory Visit (INDEPENDENT_AMBULATORY_CARE_PROVIDER_SITE_OTHER): Payer: Medicare Other | Admitting: Cardiology

## 2017-12-16 ENCOUNTER — Encounter: Payer: Self-pay | Admitting: Cardiology

## 2017-12-16 ENCOUNTER — Ambulatory Visit: Payer: Medicare Other | Admitting: Cardiology

## 2017-12-16 VITALS — BP 147/75 | HR 53 | Ht 72.0 in | Wt 228.0 lb

## 2017-12-16 DIAGNOSIS — E7800 Pure hypercholesterolemia, unspecified: Secondary | ICD-10-CM | POA: Diagnosis not present

## 2017-12-16 DIAGNOSIS — I10 Essential (primary) hypertension: Secondary | ICD-10-CM | POA: Diagnosis not present

## 2017-12-16 DIAGNOSIS — I25810 Atherosclerosis of coronary artery bypass graft(s) without angina pectoris: Secondary | ICD-10-CM | POA: Diagnosis not present

## 2017-12-16 NOTE — Patient Instructions (Signed)
Medication Instructions:   NO CHANGE  Labwork:  Your physician recommends that you return for lab work in: PRIOR TO EATING  Follow-Up:  Your physician wants you to follow-up in: 6 MONTHS WITH DR CRENSHAW You will receive a reminder letter in the mail two months in advance. If you don't receive a letter, please call our office to schedule the follow-up appointment.   If you need a refill on your cardiac medications before your next appointment, please call your pharmacy.    

## 2017-12-23 ENCOUNTER — Other Ambulatory Visit: Payer: Self-pay | Admitting: Cardiology

## 2017-12-30 ENCOUNTER — Other Ambulatory Visit: Payer: Medicare Other

## 2017-12-30 DIAGNOSIS — I25810 Atherosclerosis of coronary artery bypass graft(s) without angina pectoris: Secondary | ICD-10-CM | POA: Diagnosis not present

## 2017-12-30 DIAGNOSIS — E7800 Pure hypercholesterolemia, unspecified: Secondary | ICD-10-CM | POA: Diagnosis not present

## 2017-12-30 LAB — LIPID PANEL
CHOLESTEROL TOTAL: 132 mg/dL (ref 100–199)
Chol/HDL Ratio: 3 ratio (ref 0.0–5.0)
HDL: 44 mg/dL (ref >39)
LDL Calculated: 65 mg/dL (ref 0–99)
TRIGLYCERIDES: 117 mg/dL (ref 0–149)
VLDL CHOLESTEROL CAL: 23 mg/dL (ref 5–40)

## 2017-12-30 LAB — COMPREHENSIVE METABOLIC PANEL
A/G RATIO: 1.5 (ref 1.2–2.2)
ALBUMIN: 3.8 g/dL (ref 3.5–4.7)
ALK PHOS: 124 IU/L — AB (ref 39–117)
ALT: 61 IU/L — ABNORMAL HIGH (ref 0–44)
AST: 68 IU/L — ABNORMAL HIGH (ref 0–40)
BUN / CREAT RATIO: 14 (ref 10–24)
BUN: 12 mg/dL (ref 8–27)
Bilirubin Total: 0.6 mg/dL (ref 0.0–1.2)
CO2: 20 mmol/L (ref 20–29)
CREATININE: 0.87 mg/dL (ref 0.76–1.27)
Calcium: 8.9 mg/dL (ref 8.6–10.2)
Chloride: 105 mmol/L (ref 96–106)
GFR calc Af Amer: 92 mL/min/{1.73_m2} (ref >59)
GFR, EST NON AFRICAN AMERICAN: 79 mL/min/{1.73_m2} (ref >59)
GLOBULIN, TOTAL: 2.6 g/dL (ref 1.5–4.5)
Glucose: 91 mg/dL (ref 65–99)
POTASSIUM: 4.5 mmol/L (ref 3.5–5.2)
SODIUM: 140 mmol/L (ref 134–144)
Total Protein: 6.4 g/dL (ref 6.0–8.5)

## 2017-12-31 ENCOUNTER — Telehealth: Payer: Self-pay | Admitting: *Deleted

## 2017-12-31 DIAGNOSIS — R7989 Other specified abnormal findings of blood chemistry: Secondary | ICD-10-CM

## 2017-12-31 DIAGNOSIS — R945 Abnormal results of liver function studies: Principal | ICD-10-CM

## 2017-12-31 NOTE — Telephone Encounter (Signed)
-----   Message from Lelon Perla, MD sent at 12/30/2017  5:00 PM EDT ----- LFTs mildly elevated; repeat 12 weeks Kirk Ruths

## 2017-12-31 NOTE — Telephone Encounter (Signed)
Left message for pt to call.

## 2017-12-31 NOTE — Telephone Encounter (Signed)
Spoke with pt wife, aware of results and need for repeat labs.

## 2017-12-31 NOTE — Telephone Encounter (Signed)
Pt wife returning call from [place] today. Please call

## 2018-03-18 ENCOUNTER — Other Ambulatory Visit: Payer: Self-pay

## 2018-03-26 ENCOUNTER — Other Ambulatory Visit: Payer: Self-pay | Admitting: Adult Health

## 2018-03-26 ENCOUNTER — Other Ambulatory Visit: Payer: Self-pay | Admitting: Cardiology

## 2018-03-26 NOTE — Telephone Encounter (Signed)
Rx sent to pharmacy   

## 2018-03-26 NOTE — Telephone Encounter (Signed)
Denied. Needs appt

## 2018-04-01 ENCOUNTER — Ambulatory Visit (INDEPENDENT_AMBULATORY_CARE_PROVIDER_SITE_OTHER): Payer: Medicare Other

## 2018-04-01 VITALS — BP 130/70 | HR 59 | Ht 71.0 in | Wt 221.0 lb

## 2018-04-01 DIAGNOSIS — Z0000 Encounter for general adult medical examination without abnormal findings: Secondary | ICD-10-CM

## 2018-04-01 NOTE — Progress Notes (Addendum)
Subjective:   Bernard Byrd is a 84 y.o. male who presents for Medicare Annual/Subsequent preventive examination.  Reports health as fair Recent rehab for Cervicalgia  Loved sports  [place] [place]ived next door to family     BMI 19   Brain hemorrhagic at age 28  CABG 09/2006  "rolls with life"   Diet Cho/ hdl 3  Breakfast oatmeal; Rasin toast , medicine with juice  Lunch [place] and [place] for [employer] does not eat much supper  A1c5.5   Exercise Walks through the house on his walker 4 sets of exercise  Played softball until he was 8   Tried out for [place] Maintenance Due  Topic Date Due  . INFLUENZA VACCINE  03/19/2018   Colonoscopy 01/2009   Ambulation is compromised Hips are now the primary reason he has difficulty walking Does well with rollator        Objective:    Vitals: BP 130/70   Pulse (!) 59   Ht 5\' 11"  (1.803 m)   Wt 221 lb (100.2 kg)   SpO2 95%   BMI 30.82 kg/m   Body mass index is 30.82 kg/m.  Advanced Directives 10/14/2017 08/07/2014 08/06/2014 08/05/2014  Does Patient Have a Medical Advance Directive? No No - No  Would patient like information on creating a medical advance directive? No - Patient declined No - patient declined information No - patient declined information -    Tobacco Social History   Tobacco Use  Smoking Status Never Smoker  Smokeless Tobacco Never Used     Counseling given: Yes   Clinical Intake:   Past Medical History:  Diagnosis Date  . CAD (coronary artery disease)    CABG 2008:  myoview  2010 showed no ischemia and a normal EF.  Nuclear study 11/2011: EF 55%. There was a prior inferior basal infarct but no ischemia.  07/2015 no change on repeat nuclear study.  . CVA (cerebral vascular accident) ([place])    possible/no residual deficit--following CABG   . Diverticulosis of rectosigmoid    Noted on CT 04/2011 done to eval chronic  groin pain  . DVT (deep venous thrombosis) ([place]) 2009   Post-op from laparoscopic inguinal hernia repair  . Groin pain, left lower quadrant    Chronic, intermittent: believed to be due to scarring around MESH placed at inguinal hernia repair  . Hyperlipidemia   . Hypertension   . Melanoma ([place])    skin graft surgery required  . MELANOMA, HX OF 12/02/2008  . MI (myocardial infarction) ([place]) 10/14/2006   Inferior.    . Nephrolithiasis   . Nuclear sclerotic cataract of both eyes 2015  . Osteoarthritis    Primarily hips  . Prostate nodule    multiple benign biopsies (Dr Rosana Hoes)  . Retroperitoneal hematoma 2009   on lovenox; greenfield filter placed after this   Past Surgical History:  Procedure Laterality Date  . BILATERAL ANTERIOR TOTAL HIP ARTHROPLASTY     One in 2004 and one in 2011  . brain hemorrhage     at age 66  . CARDIOVASCULAR STRESS TEST  11/2011; 07/2015   2013 Nuclear study: EF 55%. There was a prior inferior basal infarct but no ischemia.  No change 2016.  . carpal tunnel repair B Bilateral   . CATARACT EXTRACTION    . COLONOSCOPY W/ POLYPECTOMY  02/07/09   Adenomatous polyps,  severe diverticulosis, internal hemorrhoids.  Repeat 2015---however, pt declines any further colon cancer screening  . greenfield filter    . HERNIA REPAIR  05/20/2008   open, X 3  . kidney stone removal  01/18/1988   x 5   . MELANOMA EXCISION  08/1990   left shoulder - skin graft from leg  . ROTATOR CUFF REPAIR Bilateral   . status post coronary bypass graft  10/14/06    (LIMA to the LAD, saphenous vein graft to the diagonal, saphenous vein graft to the mid obtuse marginal, saphenous vein graft placed in a sequential fashion ot the RV branch of the right coronary artery and distal right coronary artery  . trigger finger X 3     Family History  Problem Relation Age of Onset  . Cancer Mother 33       uterine and ovarian   . Heart attack Father 64  . Heart disease Father   . Heart disease  Brother    Social History   Socioeconomic History  . Marital status: Married    Spouse name: Not on file  . Number of children: Not on file  . Years of education: Not on file  . Highest education level: Not on file  Occupational History  . Not on file  Social Needs  . Financial resource strain: Not on file  . Food insecurity:    Worry: Not on file    Inability: Not on file  . Transportation needs:    Medical: Not on file    Non-medical: Not on file  Tobacco Use  . Smoking status: Never Smoker  . Smokeless tobacco: Never Used  Substance and Sexual Activity  . Alcohol use: No  . Drug use: No  . Sexual activity: Not on file  Lifestyle  . Physical activity:    Days per week: Not on file    Minutes per session: Not on file  . Stress: Not on file  Relationships  . Social connections:    Talks on phone: Not on file    Gets together: Not on file    Attends religious service: Not on file    Active member of club or organization: Not on file    Attends meetings of clubs or organizations: Not on file    Relationship status: Not on file  Other Topics Concern  . 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   Retired Risk analyst.   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    Outpatient Encounter Medications as of 04/01/2018  Medication Sig  . amLODipine (NORVASC) 10 MG tablet Take 1 tablet (10 mg total) by mouth daily.  Marland Kitchen aspirin 325 MG tablet Take 325 mg by mouth daily.    Marland Kitchen atorvastatin (LIPITOR) 10 MG tablet TAKE 1 TABLET (10 MG TOTAL) BY MOUTH DAILY.  Marland Kitchen docusate sodium (COLACE) 50 MG capsule Take 100 mg by mouth 2 (two) times daily. Reported on 11/29/2015  . ibuprofen (ADVIL,MOTRIN) 800 MG tablet TAKE 1 TABLET (800 MG TOTAL) BY MOUTH DAILY.  . metoprolol tartrate (LOPRESSOR) 50 MG tablet TAKE 1 TABLET BY MOUTH TWICE A DAY  . Multiple Vitamin (MULTIVITAMIN) tablet Take 1 tablet by mouth daily.    . [DISCONTINUED] fexofenadine (ALLEGRA) 180 MG tablet  Take 1 tablet (180 mg total) by mouth daily.  . [DISCONTINUED] furosemide (LASIX) 20 MG tablet Take 20 mg by mouth as needed.     No facility-administered encounter medications  on file as of 04/01/2018.     Activities of Daily Living No flowsheet data found.  Patient Care Team: Dorothyann Peng, NP as PCP - General (Family Medicine) Stanford Breed Denice Bors, MD as PCP - Cardiology (Cardiology) Stanford Breed Denice Bors, MD as Consulting Physician (Cardiology) Myrlene Broker, MD as Consulting Physician (Urology) Marchia Bond, MD as Consulting Physician (Orthopedic Surgery)   Assessment:   This is a routine wellness examination for Bernard Byrd.  Exercise Activities and Dietary recommendations    Goals   None     Fall Risk Fall Risk  06/11/2016 05/31/2015  Falls in the past year? No Yes  Number falls in past yr: - 2 or more  Injury with Fall? - No  Risk for fall due to : History of fall(s);Impaired balance/gait -    Depression Screen PHQ 2/9 Scores 06/11/2016 05/31/2015  PHQ - 2 Score 0 0    Cognitive Function     Ad8 score reviewed for issues:  Issues making decisions:  Less interest in hobbies / activities:  Repeats questions, stories (family complaining):  Trouble using ordinary gadgets (microwave, computer, phone):  Forgets the month or year:   Mismanaging finances:   Remembering appts:  Daily problems with thinking and/or memory: Ad8 score is=0        Immunization History  Administered Date(s) Administered  . Influenza Split 05/16/2011, 04/29/2012  . Influenza Whole 06/19/2009  . Influenza, High Dose Seasonal PF 05/31/2015, 05/14/2016, 04/22/2017  . Influenza,inj,Quad PF,6+ Mos 05/18/2014  . Influenza-Unspecified 05/11/2013  . Pneumococcal Conjugate-13 09/15/2013  . Pneumococcal Polysaccharide-23 12/15/2008  . Tdap 09/13/2011  . Zoster 04/08/2011    Screening Tests Health Maintenance  Topic Date Due  . INFLUENZA VACCINE  03/19/2018  . TETANUS/TDAP   09/12/2021  . PNA vac Low Risk Adult  Completed       Plan:      PCP Notes   Health Maintenance Colonoscopy 01/2009 - no more Educated regarding shingrix Flu vaccine in the fall   Ambulation is compromised Hips are now the primary reason he has difficulty walking Does well with rollator      Abnormal Screens  none  Referrals  none  Patient concerns; Staying healthy and enjoying life  Adjusting to life in [place]   Nurse Concerns; none  Next PCP apt TBS         I have personally reviewed and noted the following in the patient's chart:   . Medical and social history . Use of alcohol, tobacco or illicit drugs  . Current medications and supplements . Functional ability and status . Nutritional status . Physical activity . Advanced directives . List of other physicians . Hospitalizations, surgeries, and ER visits in previous 12 months . Vitals . Screenings to include cognitive, depression, and falls . Referrals and appointments  In addition, I have reviewed and discussed with patient certain preventive protocols, quality metrics, and best practice recommendations. A written personalized care plan for preventive services as well as general preventive health recommendations were provided to patient.     PYKDX,IPJAS, RN  04/01/2018  I have reviewed the documentation for the AW[facility] and [place] provided by the health coach and agree with their documentation. I was immediately available for any questions  Eulas Post MD [facility]

## 2018-04-01 NOTE — Patient Instructions (Addendum)
Bernard Byrd , Thank you for taking time to come for your Medicare Wellness Visit. I appreciate your ongoing commitment to your health goals. Please review the following plan we discussed and let me know if I can assist you in the future.   Shingrix is a vaccine for the prevention of Shingles in Adults 50 and older.  If you are on Medicare, the shingrix is covered under your Part D plan, so you will take both of the vaccines in the series at your pharmacy. Please check with your benefits regarding applicable copays or out of pocket expenses.  The Shingrix is given in 2 vaccines approx 8 weeks apart. You must receive the 2nd dose prior to 6 months from receipt of the first. Please have the pharmacist print out you Immunization  dates for our office records    These are the goals we discussed: Goals    . Patient Stated     Enjoy your life keep your health        This is a list of the screening recommended for you and due dates:  Health Maintenance  Topic Date Due  . Flu Shot  03/19/2018  . Tetanus Vaccine  09/12/2021  . Pneumonia vaccines  Completed   Prevention of falls: Remove rugs or any tripping hazards in the home Use Non slip mats in bathtubs and showers Placing grab bars next to the toilet and or shower Placing handrails on both sides of the stair way Adding extra lighting in the home.   Personal safety issues reviewed:  1. Consider starting a community watch program per [facility] 2.  Changes batteries is smoke detector and/or carbon monoxide detector  3.  If you have firearms; keep them in a safe place 4.  Wear protection when in the sun; Always wear sunscreen or a hat; It is good to have your doctor check your skin annually or review any new areas of concern 5. Driving safety; Keep in the right lane; stay 3 car lengths behind the car in front of you on the highway; look 3 times prior to pulling out; carry your cell phone everywhere you go!      Fall  Prevention in the Home Falls can cause injuries. They can happen to people of all ages. There are many things you can do to make your home safe and to help prevent falls. What can I do on the outside of my home?  Regularly fix the edges of walkways and driveways and fix any cracks.  Remove anything that might make you trip as you walk through a door, such as a raised step or threshold.  Trim any bushes or trees on the path to your home.  Use bright outdoor lighting.  Clear any walking paths of anything that might make someone trip, such as rocks or tools.  Regularly check to see if handrails are loose or broken. Make sure that both sides of any steps have handrails.  Any raised decks and porches should have guardrails on the edges.  Have any leaves, snow, or ice cleared regularly.  Use sand or salt on walking paths during winter.  Clean up any spills in your garage right away. This includes oil or grease spills. What can I do in the bathroom?  Use night lights.  Install grab bars by the toilet and in the tub and shower. Do not use towel bars as grab bars.  Use non-skid mats or decals in the tub or shower.  If you need to sit down in the shower, use a plastic, non-slip stool.  Keep the floor dry. Clean up any water that spills on the floor as soon as it happens.  Remove soap buildup in the tub or shower regularly.  Attach bath mats securely with double-sided non-slip rug tape.  Do not have throw rugs and other things on the floor that can make you trip. What can I do in the bedroom?  Use night lights.  Make sure that you have a light by your bed that is easy to reach.  Do not use any sheets or blankets that are too big for your bed. They should not hang down onto the floor.  Have a firm chair that has side arms. You can use this for support while you get dressed.  Do not have throw rugs and other things on the floor that can make you trip. What can I do in the  kitchen?  Clean up any spills right away.  Avoid walking on wet floors.  Keep items that you use a lot in easy-to-reach places.  If you need to reach something above you, use a strong step stool that has a grab bar.  Keep electrical cords out of the way.  Do not use floor polish or wax that makes floors slippery. If you must use wax, use non-skid floor wax.  Do not have throw rugs and other things on the floor that can make you trip. What can I do with my stairs?  Do not leave any items on the stairs.  Make sure that there are handrails on both sides of the stairs and use them. Fix handrails that are broken or loose. Make sure that handrails are as long as the stairways.  Check any carpeting to make sure that it is firmly attached to the stairs. Fix any carpet that is loose or worn.  Avoid having throw rugs at the top or bottom of the stairs. If you do have throw rugs, attach them to the floor with carpet tape.  Make sure that you have a light switch at the top of the stairs and the bottom of the stairs. If you do not have them, ask someone to add them for you. What else can I do to help prevent falls?  Wear shoes that: ? Do not have high heels. ? Have rubber bottoms. ? Are comfortable and fit you well. ? Are closed at the toe. Do not wear sandals.  If you use a stepladder: ? Make sure that it is fully opened. Do not climb a closed stepladder. ? Make sure that both sides of the stepladder are locked into place. ? Ask someone to hold it for you, if possible.  Clearly mark and make sure that you can see: ? Any grab bars or handrails. ? First and last steps. ? Where the edge of each step is.  Use tools that help you move around (mobility aids) if they are needed. These include: ? Canes. ? Walkers. ? Scooters. ? Crutches.  Turn on the lights when you go into a dark area. Replace any light bulbs as soon as they burn out.  Set up your furniture so you have a clear path.  Avoid moving your furniture around.  If any of your floors are uneven, fix them.  If there are any pets around you, be aware of where they are.  Review your medicines with your doctor. Some medicines can make you feel dizzy. This  can increase your chance of falling. Ask your doctor what other things that you can do to help prevent falls. This information is not intended to replace advice given to you by your health care provider. Make sure you discuss any questions you have with your health care provider. Document Released: 06/01/2009 Document Revised: 01/11/2016 Document Reviewed: 09/09/2014 Elsevier Interactive Patient Education  2018 [place] Maintenance, Male A healthy lifestyle and preventive care is important for your health and wellness. Ask your health care provider about what schedule of regular examinations is right for you. What should I know about weight and diet? Eat a Healthy Diet  Eat plenty of vegetables, fruits, whole grains, low-fat dairy products, and lean protein.  Do not eat a lot of foods high in solid fats, added sugars, or salt.  Maintain a Healthy Weight Regular exercise can help you achieve or maintain a healthy weight. You should:  Do at least 150 minutes of exercise each week. The exercise should increase your heart rate and make you sweat (moderate-intensity exercise).  Do strength-training exercises at least twice a week.  Watch Your Levels of Cholesterol and Blood Lipids  Have your blood tested for lipids and cholesterol every 5 years starting at 82 years of age. If you are at high risk for heart disease, you should start having your blood tested when you are 82 years old. You may need to have your cholesterol levels checked more often if: ? Your lipid or cholesterol levels are high. ? You are older than 82 years of age. ? You are at high risk for heart disease.  What should I know about cancer screening? Many types of cancers can be  detected early and may often be prevented. Lung Cancer  You should be screened every year for lung cancer if: ? You are a current smoker who has smoked for at least 30 years. ? You are a former smoker who has quit within the past 15 years.  Talk to your health care provider about your screening options, when you should start screening, and how often you should be screened.  Colorectal Cancer  Routine colorectal cancer screening usually begins at 82 years of age and should be repeated every 5-10 years until you are 82 years old. You may need to be screened more often if early forms of precancerous polyps or small growths are found. Your health care provider may recommend screening at an earlier age if you have risk factors for colon cancer.  Your health care provider may recommend using home test kits to check for hidden blood in the stool.  A small camera at the end of a tube can be used to examine your colon (sigmoidoscopy or colonoscopy). This checks for the earliest forms of colorectal cancer.  Prostate and Testicular Cancer  Depending on your age and overall health, your health care provider may do certain tests to screen for prostate and testicular cancer.  Talk to your health care provider about any symptoms or concerns you have about testicular or prostate cancer.  Skin Cancer  Check your skin from head to toe regularly.  Tell your health care provider about any new moles or changes in moles, especially if: ? There is a change in a mole's size, shape, or color. ? You have a mole that is larger than a pencil eraser.  Always use sunscreen. Apply sunscreen liberally and repeat throughout the day.  Protect yourself by wearing long sleeves, pants, a wide-brimmed hat,  and sunglasses when outside.  What should I know about heart disease, diabetes, and high blood pressure?  If you are 21-81 years of age, have your blood pressure checked every 3-5 years. If you are 75 years of age  or older, have your blood pressure checked every year. You should have your blood pressure measured twice-once when you are at a hospital or clinic, and once when you are not at a hospital or clinic. Record the average of the two measurements. To check your blood pressure when you are not at a hospital or clinic, you can use: ? An automated blood pressure machine at a pharmacy. ? A home blood pressure monitor.  Talk to your health care provider about your target blood pressure.  If you are between 25-42 years old, ask your health care provider if you should take aspirin to prevent heart disease.  Have regular diabetes screenings by checking your fasting blood sugar level. ? If you are at a normal weight and have a low risk for diabetes, have this test once every three years after the age of 77. ? If you are overweight and have a high risk for diabetes, consider being tested at a younger age or more often.  A one-time screening for abdominal aortic aneurysm (AAA) by ultrasound is recommended for men aged 15-75 years who are current or former smokers. What should I know about preventing infection? Hepatitis B If you have a higher risk for hepatitis B, you should be screened for this virus. Talk with your health care provider to find out if you are at risk for hepatitis B infection. Hepatitis C Blood testing is recommended for:  Everyone born from [birth year] through 1963.  Anyone with known risk factors for hepatitis C.  Sexually Transmitted Diseases (STDs)  You should be screened each year for STDs including gonorrhea and chlamydia if: ? You are sexually active and are younger than 82 years of age. ? You are older than 82 years of age and your health care provider tells you that you are at risk for this type of infection. ? Your sexual activity has changed since you were last screened and you are at an increased risk for chlamydia or gonorrhea. Ask your health care provider if you are at  risk.  Talk with your health care provider about whether you are at high risk of being infected with HIV. Your health care provider may recommend a prescription medicine to help prevent HIV infection.  What else can I do?  Schedule regular health, dental, and eye exams.  Stay current with your vaccines (immunizations).  Do not use any tobacco products, such as cigarettes, chewing tobacco, and e-cigarettes. If you need help quitting, ask your health care provider.  Limit alcohol intake to no more than 2 drinks per day. One drink equals 12 ounces of beer, 5 ounces of wine, or 1 ounces of hard liquor.  Do not use street drugs.  Do not share needles.  Ask your health care provider for help if you need support or information about quitting drugs.  Tell your health care provider if you often feel depressed.  Tell your health care provider if you have ever been abused or do not feel safe at home. This information is not intended to replace advice given to you by your health care provider. Make sure you discuss any questions you have with your health care provider. Document Released: 02/01/2008 Document Revised: 04/03/2016 Document Reviewed: 05/09/2015 Elsevier Interactive Patient Education  2018 Elsevier Inc[employer]  

## 2018-04-03 DIAGNOSIS — R945 Abnormal results of liver function studies: Secondary | ICD-10-CM | POA: Diagnosis not present

## 2018-04-03 LAB — HEPATIC FUNCTION PANEL
ALK PHOS: 114 IU/L (ref 39–117)
ALT: 50 IU/L — ABNORMAL HIGH (ref 0–44)
AST: 53 IU/L — AB (ref 0–40)
Albumin: 3.7 g/dL (ref 3.5–4.7)
BILIRUBIN, DIRECT: 0.18 mg/dL (ref 0.00–0.40)
Bilirubin Total: 0.5 mg/dL (ref 0.0–1.2)
Total Protein: 6 g/dL (ref 6.0–8.5)

## 2018-04-13 DIAGNOSIS — Z23 Encounter for immunization: Secondary | ICD-10-CM | POA: Diagnosis not present

## 2018-04-27 ENCOUNTER — Other Ambulatory Visit: Payer: Self-pay

## 2018-04-27 MED ORDER — AMLODIPINE BESYLATE 10 MG PO TABS: 10.0000 mg | ORAL_TABLET | Freq: Every day | ORAL | 2 refills | Status: DC

## 2018-04-27 NOTE — Telephone Encounter (Signed)
Rx(s) sent to pharmacy electronically.  

## 2018-04-28 ENCOUNTER — Other Ambulatory Visit: Payer: Self-pay | Admitting: *Deleted

## 2018-04-28 MED ORDER — ATORVASTATIN CALCIUM 10 MG PO TABS: 10.0000 mg | ORAL_TABLET | Freq: Every day | ORAL | 2 refills | Status: DC

## 2018-04-30 DIAGNOSIS — H18831 Recurrent erosion of cornea, right eye: Secondary | ICD-10-CM | POA: Diagnosis not present

## 2018-05-01 DIAGNOSIS — H18831 Recurrent erosion of cornea, right eye: Secondary | ICD-10-CM | POA: Diagnosis not present

## 2018-05-21 ENCOUNTER — Other Ambulatory Visit: Payer: Self-pay | Admitting: Cardiology

## 2018-05-21 ENCOUNTER — Other Ambulatory Visit: Payer: Self-pay | Admitting: Adult Health

## 2018-05-25 NOTE — Telephone Encounter (Signed)
Ok to refill for 90 +1 

## 2018-05-25 NOTE — Telephone Encounter (Signed)
Sent to the pharmacy by e-scribe. 

## 2018-06-04 DIAGNOSIS — M79671 Pain in right foot: Secondary | ICD-10-CM | POA: Diagnosis not present

## 2018-06-04 DIAGNOSIS — L84 Corns and callosities: Secondary | ICD-10-CM | POA: Diagnosis not present

## 2018-06-04 DIAGNOSIS — M21962 Unspecified acquired deformity of left lower leg: Secondary | ICD-10-CM | POA: Diagnosis not present

## 2018-06-04 DIAGNOSIS — M79672 Pain in left foot: Secondary | ICD-10-CM | POA: Diagnosis not present

## 2018-06-08 DIAGNOSIS — H18831 Recurrent erosion of cornea, right eye: Secondary | ICD-10-CM | POA: Diagnosis not present

## 2018-06-08 DIAGNOSIS — H1013 Acute atopic conjunctivitis, bilateral: Secondary | ICD-10-CM | POA: Diagnosis not present

## 2018-06-10 DIAGNOSIS — H1013 Acute atopic conjunctivitis, bilateral: Secondary | ICD-10-CM | POA: Diagnosis not present

## 2018-06-10 DIAGNOSIS — H18831 Recurrent erosion of cornea, right eye: Secondary | ICD-10-CM | POA: Diagnosis not present

## 2018-06-21 ENCOUNTER — Other Ambulatory Visit: Payer: Self-pay | Admitting: Cardiology

## 2018-06-22 DIAGNOSIS — H18831 Recurrent erosion of cornea, right eye: Secondary | ICD-10-CM | POA: Diagnosis not present

## 2018-06-22 DIAGNOSIS — H18832 Recurrent erosion of cornea, left eye: Secondary | ICD-10-CM | POA: Diagnosis not present

## 2018-06-22 DIAGNOSIS — H04123 Dry eye syndrome of bilateral lacrimal glands: Secondary | ICD-10-CM | POA: Diagnosis not present

## 2018-06-22 DIAGNOSIS — H02883 Meibomian gland dysfunction of right eye, unspecified eyelid: Secondary | ICD-10-CM | POA: Diagnosis not present

## 2018-06-22 NOTE — Telephone Encounter (Signed)
Patient needs to schedule OV

## 2018-07-14 ENCOUNTER — Other Ambulatory Visit: Payer: Self-pay | Admitting: Cardiology

## 2018-11-01 ENCOUNTER — Other Ambulatory Visit: Payer: Self-pay | Admitting: Adult Health

## 2018-12-23 ENCOUNTER — Ambulatory Visit: Payer: Medicare Other | Admitting: Cardiology

## 2019-01-03 ENCOUNTER — Other Ambulatory Visit: Payer: Self-pay | Admitting: Cardiology

## 2019-02-09 ENCOUNTER — Other Ambulatory Visit: Payer: Self-pay | Admitting: Cardiology

## 2019-02-15 ENCOUNTER — Telehealth: Payer: Self-pay | Admitting: Physician Assistant

## 2019-02-15 ENCOUNTER — Telehealth: Payer: Self-pay | Admitting: Cardiology

## 2019-02-15 NOTE — Telephone Encounter (Signed)
Called pt/wife appointment scheduled 7-7 @845am 

## 2019-02-15 NOTE — Telephone Encounter (Signed)
Pt c/o Shortness Of Breath: STAT if SOB developed within the last 24 hours or pt is noticeably SOB on the phone  1. Are you currently SOB (can you hear that pt is SOB on the phone)? No, spoke to wife  2. How long have you been experiencing SOB? About two months or so  3. Are you SOB when sitting or when up moving around? both  4. Are you currently experiencing any other symptoms? SOB, weakness, wife states today he is too weak to get up. Patient's wife would like for him to come in and be seen.

## 2019-02-15 NOTE — Telephone Encounter (Signed)
Arrange PA office visit Bernard Byrd

## 2019-02-15 NOTE — Telephone Encounter (Signed)
Bernard Byrd (wife) she states that pt is very SOB with exertion since Friday, but this has been intermittent for the last 2+ months.. She has not taken his BP lately His weight is up 4# today. He feels weak and his left LE is swollen. He does not take lasix, he takes all his other medications ad directed. Pt has CHF, s/p CABG and has a murmur.She states that her daughter Daryll Drown, [place]) is a hospitalist at [employer]. She states that she wanted to see if pt could have an ECHO.  She states that she is willing to have a telephone visit but does not have access to video.

## 2019-02-15 NOTE — Telephone Encounter (Signed)

## 2019-02-16 ENCOUNTER — Encounter: Payer: Self-pay | Admitting: Physician Assistant

## 2019-02-16 ENCOUNTER — Other Ambulatory Visit: Payer: Self-pay

## 2019-02-16 ENCOUNTER — Ambulatory Visit (INDEPENDENT_AMBULATORY_CARE_PROVIDER_SITE_OTHER): Payer: Medicare Other | Admitting: Physician Assistant

## 2019-02-16 VITALS — BP 135/79 | HR 64 | Resp 16 | Ht 71.0 in | Wt 194.0 lb

## 2019-02-16 DIAGNOSIS — I2510 Atherosclerotic heart disease of native coronary artery without angina pectoris: Secondary | ICD-10-CM | POA: Diagnosis not present

## 2019-02-16 DIAGNOSIS — R0600 Dyspnea, unspecified: Secondary | ICD-10-CM | POA: Diagnosis not present

## 2019-02-16 DIAGNOSIS — E7800 Pure hypercholesterolemia, unspecified: Secondary | ICD-10-CM

## 2019-02-16 DIAGNOSIS — I2583 Coronary atherosclerosis due to lipid rich plaque: Secondary | ICD-10-CM | POA: Diagnosis not present

## 2019-02-16 DIAGNOSIS — R0602 Shortness of breath: Secondary | ICD-10-CM | POA: Diagnosis not present

## 2019-02-16 DIAGNOSIS — I25810 Atherosclerosis of coronary artery bypass graft(s) without angina pectoris: Secondary | ICD-10-CM

## 2019-02-16 DIAGNOSIS — I10 Essential (primary) hypertension: Secondary | ICD-10-CM

## 2019-02-16 DIAGNOSIS — R0609 Other forms of dyspnea: Secondary | ICD-10-CM

## 2019-02-16 MED ORDER — FUROSEMIDE 20 MG PO TABS: 20.0000 mg | ORAL_TABLET | Freq: Every day | ORAL | 0 refills | Status: DC

## 2019-02-16 NOTE — Patient Instructions (Addendum)
Medication Instructions:   Start Lasix 20 Mg Daily for 2 weeks  If you need a refill on your cardiac medications before your next appointment, please call your pharmacy.   Lab work: You will need to have labs (blood work) drawn today:  BMP  BNP  CBC  TSH You will also need to return to the office in 1-2 weeks to have a repeat:  BMP  If you have labs (blood work) drawn today and your tests are completely normal, you will receive your results only by: Marland Kitchen MyChart Message (if you have MyChart) OR . A paper copy in the mail If you have any lab test that is abnormal or we need to change your treatment, we will call you to review the results.  Testing/Procedures: Your physician has requested that you have an echocardiogram. Echocardiography is a painless test that uses sound waves to create images of your heart. It provides your doctor with information about the size and shape of your heart and how well your heart's chambers and valves are working. This procedure takes approximately one hour. There are no restrictions for this procedure.  A chest x-ray takes a picture of the organs and structures inside the chest, including the heart, lungs, and blood vessels. This test can show several things, including, whether the heart is enlarges; whether fluid is building up in the lungs; and whether pacemaker / defibrillator leads are still in place.  Follow-Up: At [facility], you and your health needs are our priority.  As part of our continuing mission to provide you with exceptional heart care, we have created designated Provider Care Teams.  These Care Teams include your primary Cardiologist (physician) and Advanced Practice Providers (APPs -  Physician Assistants and Nurse Practitioners) who all work together to provide you with the care you need, when you need it. . Your physician recommends that you schedule a follow-up appointment in: 2 weeks with Coletta Memos, NP or Almyra Deforest,  PA-C   Any Other Special Instructions Will Be Listed Below (If Applicable).   You will need to elevate you legs  You will need to wear support hose  Your Physician recommends a Low Sodium Diet    Low-Sodium Eating Plan Sodium, which is an element that makes up salt, helps you maintain a healthy balance of fluids in your body. Too much sodium can increase your blood pressure and cause fluid and waste to be held in your body. Your health care provider or dietitian may recommend following this plan if you have high blood pressure (hypertension), kidney disease, liver disease, or heart failure. Eating less sodium can help lower your blood pressure, reduce swelling, and protect your heart, liver, and kidneys. What are tips for following this plan? General guidelines  Most people on this plan should limit their sodium intake to 1,500-2,000 mg (milligrams) of sodium each day. Reading food labels   The Nutrition Facts label lists the amount of sodium in one serving of the food. If you eat more than one serving, you must multiply the listed amount of sodium by the number of servings.  Choose foods with less than 140 mg of sodium per serving.  Avoid foods with 300 mg of sodium or more per serving. Shopping  Look for lower-sodium products, often labeled as "low-sodium" or "no salt added."  Always check the sodium content even if foods are labeled as "unsalted" or "no salt added".  Buy fresh foods. ? Avoid canned foods and premade or frozen meals. ?  Avoid canned, cured, or processed meats  Buy breads that have less than 80 mg of sodium per slice. Cooking  Eat more home-cooked food and less restaurant, buffet, and fast food.  Avoid adding salt when cooking. Use salt-free seasonings or herbs instead of table salt or sea salt. Check with your health care provider or pharmacist before using salt substitutes.  Cook with plant-based oils, such as canola, sunflower, or olive oil. Meal  planning  When eating at a restaurant, ask that your food be prepared with less salt or no salt, if possible.  Avoid foods that contain MSG (monosodium glutamate). MSG is sometimes added to [place] food, bouillon, and some canned foods. What foods are recommended? The items listed may not be a complete list. Talk with your dietitian about what dietary choices are best for you. Grains Low-sodium cereals, including oats, puffed wheat and rice, and shredded wheat. Low-sodium crackers. Unsalted rice. Unsalted pasta. Low-sodium bread. Whole-grain breads and whole-grain pasta. Vegetables Fresh or frozen vegetables. "No salt added" canned vegetables. "No salt added" tomato sauce and paste. Low-sodium or reduced-sodium tomato and vegetable juice. Fruits Fresh, frozen, or canned fruit. Fruit juice. Meats and other protein foods Fresh or frozen (no salt added) meat, poultry, seafood, and fish. Low-sodium canned tuna and salmon. Unsalted nuts. Dried peas, beans, and lentils without added salt. Unsalted canned beans. Eggs. Unsalted nut butters. Dairy Milk. Soy milk. Cheese that is naturally low in sodium, such as ricotta cheese, fresh mozzarella, or Swiss cheese Low-sodium or reduced-sodium cheese. Cream cheese. Yogurt. Fats and oils Unsalted butter. Unsalted margarine with no trans fat. Vegetable oils such as canola or olive oils. Seasonings and other foods Fresh and dried herbs and spices. Salt-free seasonings. Low-sodium mustard and ketchup. Sodium-free salad dressing. Sodium-free light mayonnaise. Fresh or refrigerated horseradish. Lemon juice. Vinegar. Homemade, reduced-sodium, or low-sodium soups. Unsalted popcorn and pretzels. Low-salt or salt-free chips. What foods are not recommended? The items listed may not be a complete list. Talk with your dietitian about what dietary choices are best for you. Grains Instant hot cereals. Bread stuffing, pancake, and biscuit mixes. Croutons. Seasoned rice or  pasta mixes. Noodle soup cups. Boxed or frozen macaroni and cheese. Regular salted crackers. Self-rising flour. Vegetables Sauerkraut, pickled vegetables, and relishes. Olives. [place] fries. Onion rings. Regular canned vegetables (not low-sodium or reduced-sodium). Regular canned tomato sauce and paste (not low-sodium or reduced-sodium). Regular tomato and vegetable juice (not low-sodium or reduced-sodium). Frozen vegetables in sauces. Meats and other protein foods Meat or fish that is salted, canned, smoked, spiced, or pickled. Bacon, ham, sausage, hotdogs, corned beef, chipped beef, packaged lunch meats, salt pork, jerky, pickled herring, anchovies, regular canned tuna, sardines, salted nuts. Dairy Processed cheese and cheese spreads. Cheese curds. Blue cheese. Feta cheese. String cheese. Regular cottage cheese. Buttermilk. Canned milk. Fats and oils Salted butter. Regular margarine. Ghee. Bacon fat. Seasonings and other foods Onion salt, garlic salt, seasoned salt, table salt, and sea salt. Canned and packaged gravies. Worcestershire sauce. Tartar sauce. Barbecue sauce. Teriyaki sauce. Soy sauce, including reduced-sodium. Steak sauce. Fish sauce. Oyster sauce. Cocktail sauce. Horseradish that you find on the shelf. Regular ketchup and mustard. Meat flavorings and tenderizers. Bouillon cubes. Hot sauce and Tabasco sauce. Premade or packaged marinades. Premade or packaged taco seasonings. Relishes. Regular salad dressings. Salsa. Potato and tortilla chips. Corn chips and puffs. Salted popcorn and pretzels. Canned or dried soups. Pizza. Frozen entrees and pot pies. Summary  Eating less sodium can help lower your blood pressure,  reduce swelling, and protect your heart, liver, and kidneys.  Most people on this plan should limit their sodium intake to 1,500-2,000 mg (milligrams) of sodium each day.  Canned, boxed, and frozen foods are high in sodium. Restaurant foods, fast foods, and pizza are also  very high in sodium. You also get sodium by adding salt to food.  Try to cook at home, eat more fresh fruits and vegetables, and eat less fast food, canned, processed, or prepared foods. This information is not intended to replace advice given to you by your health care provider. Make sure you discuss any questions you have with your health care provider. Document Released: 01/25/2002 Document Revised: 07/18/2017 Document Reviewed: 07/29/2016 Elsevier Patient Education  2020 [employer].

## 2019-02-16 NOTE — Progress Notes (Signed)
Cardiology Clinic Note   Patient Name: Bernard Byrd Date of Encounter: 02/16/2019  Primary Care Provider:  Dorothyann Peng, NP Primary Cardiologist:  Kirk Ruths, MD  Patient Profile    Bernard Byrd 85 year old male presents today with a chief complaint of shortness of breath x2 months and left lower leg swelling.  Past Medical History    Past Medical History:  Diagnosis Date   CAD (coronary artery disease)    CABG 2008:  myoview  2010 showed no ischemia and a normal EF.  Nuclear study 11/2011: EF 55%. There was a prior inferior basal infarct but no ischemia.  07/2015 no change on repeat nuclear study.   CVA (cerebral vascular accident) ([place])    possible/no residual deficit--following CABG    Diverticulosis of rectosigmoid    Noted on CT 04/2011 done to eval chronic groin pain   DVT (deep venous thrombosis) ([place]) 2009   Post-op from laparoscopic inguinal hernia repair   Groin pain, left lower quadrant    Chronic, intermittent: believed to be due to scarring around MESH placed at inguinal hernia repair   Hyperlipidemia    Hypertension    Melanoma ([place])    skin graft surgery required   MELANOMA, HX OF 12/02/2008   MI (myocardial infarction) ([place]) 10/14/2006   Inferior.     Nephrolithiasis    Nuclear sclerotic cataract of both eyes 2015   Osteoarthritis    Primarily hips   Prostate nodule    multiple benign biopsies (Dr Rosana Hoes)   Retroperitoneal hematoma 2009   on lovenox; greenfield filter placed after this   Past Surgical History:  Procedure Laterality Date   BILATERAL ANTERIOR TOTAL HIP ARTHROPLASTY     One in 2004 and one in 2011   brain hemorrhage     at age 89   CARDIOVASCULAR STRESS TEST  11/2011; 07/2015   2013 Nuclear study: EF 55%. There was a prior inferior basal infarct but no ischemia.  No change 2016.   carpal tunnel repair B Bilateral    CATARACT EXTRACTION     COLONOSCOPY W/ POLYPECTOMY  02/07/09   Adenomatous polyps, severe  diverticulosis, internal hemorrhoids.  Repeat 2015---however, pt declines any further colon cancer screening   greenfield filter     HERNIA REPAIR  05/20/2008   open, X 3   kidney stone removal  01/18/1988   x 5    MELANOMA EXCISION  08/1990   left shoulder - skin graft from leg   ROTATOR CUFF REPAIR Bilateral    status post coronary bypass graft  10/14/06    (LIMA to the LAD, saphenous vein graft to the diagonal, saphenous vein graft to the mid obtuse marginal, saphenous vein graft placed in a sequential fashion ot the RV branch of the right coronary artery and distal right coronary artery   trigger finger X 3      Allergies  Allergies  Allergen Reactions   Clindamycin Palmitate Hcl Itching    Difficulty breathing   Clindamycin/Lincomycin Itching and Hives    Difficulty breathing   Penicillins Hives and Rash   Sulfa Antibiotics Hives   Other Hives and Rash   Sulfonamide Derivatives Hives and Rash    History of Present Illness  Bernard Byrd was last seen by Dr. Stanford Breed 12/16/2017.  During that time he did not have chest pain, his blood pressure was well-controlled, and his LDL cholesterol was managed well with atorvastatin 10 mg daily.  He has a history of inferior MI and  is post 5 vessel CABG (2/08), had CVA post CABG.  Ankle-brachial index in August 2010 were normal.  An abdominal CT 08/07/2014 showed no aneurysm.  Nuclear study on December 2016 showed inferior scar but no ischemia.  His ejection fraction was 57%.  He has a PMH of benign hypertension, coronary artery disease, hepatic steatosis, cholelithiasis, nephrolithiasis, dyspnea, hyperlipidemia, and chest pain.  Today he complains of left lower extremity edema and shortness of breath x2 months.  He states he injured his left lower leg while he was in the [employer] playing contact sports.  He also states that his left leg was in the leg that was used for his SVG graft.  He denies chest pain, dizziness, melena, hematuria,  hemoptysis, syncope, presyncope, headaches, orthopnea and PND.  EKG 12/16/2017: Sinus bradycardia with occasional PVCs, 53 bpm  EKG 10/21/2016: Sinus bradycardia with nonspecific T wave changes, 53 bpm   Home Medications    Prior to Admission medications   Medication Sig Start Date End Date Taking? Authorizing Provider  amLODipine (NORVASC) 10 MG tablet Take 1 tablet (10 mg total) by mouth daily. 04/27/18   Martinique, Peter M, MD  aspirin 325 MG tablet Take 325 mg by mouth daily.      [provider]  atorvastatin (LIPITOR) 10 MG tablet TAKE 1 TABLET BY MOUTH EVERY DAY 01/04/19   Lelon Perla, MD  docusate sodium (COLACE) 50 MG capsule Take 100 mg by mouth 2 (two) times daily. Reported on 11/29/2015    [provider]  ibuprofen (ADVIL,MOTRIN) 800 MG tablet TAKE 1 TABLET (800 MG TOTAL) BY MOUTH DAILY. 11/03/18   Nafziger, [name], NP  metoprolol tartrate (LOPRESSOR) 50 MG tablet TAKE 1 TABLET BY MOUTH TWICE A DAY 02/09/19   Lelon Perla, MD  Multiple Vitamin (MULTIVITAMIN) tablet Take 1 tablet by mouth daily.      [provider]  fexofenadine (ALLEGRA) 180 MG tablet Take 1 tablet (180 mg total) by mouth daily. 04/29/11 11/13/11  McGowen, Adrian [place], MD  furosemide (LASIX) 20 MG tablet Take 20 mg by mouth as needed.    11/13/11  [provider]    Family History    Family History  Problem Relation Age of Onset   Cancer Mother 81       uterine and ovarian    Heart attack Father 97   Heart disease Father    Heart disease Brother    He indicated that his mother is deceased. He indicated that his father is deceased. He indicated that the status of his brother is unknown. He indicated that his maternal grandmother is deceased. He indicated that his maternal grandfather is deceased. He indicated that his paternal grandmother is deceased. He indicated that his paternal grandfather is deceased.  Social History    Social History   Socioeconomic History    Marital status: Married    Spouse name: Not on file   Number of children: Not on file   Years of education: Not on file   Highest education level: Not on file  Occupational History   Not on file  Social Needs   Financial resource strain: Not on file   Food insecurity    Worry: Not on file    Inability: Not on file   Transportation needs    Medical: Not on file    Non-medical: Not on file  Tobacco Use   Smoking status: Never Smoker   Smokeless tobacco: Never Used  Substance and Sexual Activity  Alcohol use: No   Drug use: No   Sexual activity: Not on file  Lifestyle   Physical activity    Days per week: Not on file    Minutes per session: Not on file   Stress: Not on file  Relationships   Social connections    Talks on phone: Not on file    Gets together: Not on file    Attends religious service: Not on file    Active member of club or organization: Not on file    Attends meetings of clubs or organizations: Not on file    Relationship status: Not on file   Intimate partner violence    Fear of current or ex partner: Not on file    Emotionally abused: Not on file    Physically abused: Not on file    Forced sexual activity: Not on file  Other Topics Concern   Not on file  Social History Narrative   Married 50+ yrs.   Retired Risk analyst.   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     Review of Systems    General:  No chills, fever, night sweats or weight changes.  Cardiovascular:  No chest pain, dyspnea on exertion, edema, orthopnea, palpitations, paroxysmal nocturnal dyspnea. Dermatological: No rash, lesions/masses Respiratory: No cough, dyspnea Urologic: No hematuria, dysuria Abdominal:   No nausea, vomiting, diarrhea, bright red blood per rectum, melena, or hematemesis Neurologic:  No visual changes, wkns, changes in mental status. All other systems reviewed and are otherwise negative except as noted above.  Physical Exam    VS:   BP 135/79    Pulse 64    Resp 16    Wt 194 lb (88 kg)    SpO2 99% Comment: RA   BMI 27.06 kg/m  , BMI Body mass index is 27.06 kg/m. GEN: Well nourished, well developed, in no acute distress. HEENT: normal. Neck: Supple, no JVD, carotid bruits, or masses. Cardiac: RRR, +++Grade 1/6 aortic murmur, rubs, or gallops. No clubbing, cyanosis, ++2 pitting edema right lower extremity ++3 pitting left lower extremity with chronic medial edema related to trauma and SVG harvest .  Radials/DP/PT 2+ and equal bilaterally.  Respiratory:  Respirations regular and unlabored, +++Crackles right lower lobe and diminished breath sounds left lower lobe. Bilateral upper lobes clear to auscultation. GI: Soft, nontender, nondistended, BS + x 4. MS: no deformity or atrophy. Skin: warm and dry, no rash. Neuro:  Strength and sensation are intact. Psych: Normal affect.  Accessory Clinical Findings    ECG personally reviewed by me today- NSR 64 BMP, no T wave deviation - No acute changes  Assessment & Plan  1.  Coronary artery disease-no chest pain, increased shortness of breath, increased lower extremity edema +2 pitting edema bilateral  Start wearing support stockings Elevate extremities Increase physical activity as tolerated Low sodium diet Start Furosemide 20 mg daily Order BMP today and  in 1 week Order BNP Order Echocardiogram   2. Dyspnea on Exretion- diminished breath sounds left lower lobe  Order CXR  Order CBC Order TSH  3.  Essential hypertension- well-controlled Continue amlodipine 10 mg tablet daily Continue metoprolol tartrate 50 mg tablet twice daily Increase physical activity as tolerated Low-sodium heart healthy diet  4.  Pure hypercholesterolemia- LDL 65 (12/30/2017) Continue atorvastatin 10 mg tablet at bedtime Order lipid panel in 1 week  Disposition: Follow-up in 2 weeks with APP.   Deberah Pelton, NP 02/16/2019, 4:47 PM

## 2019-02-17 ENCOUNTER — Ambulatory Visit
Admission: RE | Admit: 2019-02-17 | Discharge: 2019-02-17 | Disposition: A | Discharge status: Home / Self Care | Payer: Medicare Other | Source: Ambulatory Visit | Attending: General Practice | Admitting: General Practice

## 2019-02-17 ENCOUNTER — Other Ambulatory Visit: Payer: Self-pay | Admitting: General Practice

## 2019-02-17 ENCOUNTER — Telehealth: Payer: Self-pay | Admitting: General Practice

## 2019-02-17 DIAGNOSIS — R0609 Other forms of dyspnea: Secondary | ICD-10-CM

## 2019-02-17 DIAGNOSIS — J90 Pleural effusion, not elsewhere classified: Secondary | ICD-10-CM | POA: Diagnosis not present

## 2019-02-17 LAB — CBC
Hematocrit: 40.2 % (ref 37.5–51.0)
Hemoglobin: 14.4 g/dL (ref 13.0–17.7)
MCH: 35 pg — ABNORMAL HIGH (ref 26.6–33.0)
MCHC: 35.8 g/dL — ABNORMAL HIGH (ref 31.5–35.7)
MCV: 98 fL — ABNORMAL HIGH (ref 79–97)
Platelets: 162 10*3/uL (ref 150–450)
RBC: 4.11 x10E6/uL — ABNORMAL LOW (ref 4.14–5.80)
RDW: 13.1 % (ref 11.6–15.4)
WBC: 6.8 10*3/uL (ref 3.4–10.8)

## 2019-02-17 LAB — BASIC METABOLIC PANEL
BUN/Creatinine Ratio: 12 (ref 10–24)
BUN: 11 mg/dL (ref 8–27)
CO2: 20 mmol/L (ref 20–29)
Calcium: 8.9 mg/dL (ref 8.6–10.2)
Chloride: 104 mmol/L (ref 96–106)
Creatinine, Ser: 0.9 mg/dL (ref 0.76–1.27)
GFR calc Af Amer: 90 mL/min/{1.73_m2} (ref >59)
GFR calc non Af Amer: 78 mL/min/{1.73_m2} (ref >59)
Glucose: 100 mg/dL — ABNORMAL HIGH (ref 65–99)
Potassium: 4.1 mmol/L (ref 3.5–5.2)
Sodium: 141 mmol/L (ref 134–144)

## 2019-02-17 LAB — TSH: TSH: 3.73 u[IU]/mL (ref 0.450–4.500)

## 2019-02-17 LAB — BRAIN NATRIURETIC PEPTIDE: BNP: 137.7 pg/mL — ABNORMAL HIGH (ref 0.0–100.0)

## 2019-02-17 NOTE — Telephone Encounter (Signed)
I spoke with The patients daughter [name] who is an NP. She stated she would like to see how her dad responds to new lasix treatment before escalating his care.

## 2019-02-17 NOTE — Telephone Encounter (Signed)
Call patients daughter and LVM to call the clinic back. He had a CXR today 7/1. It showed a large left pleural effusion. I have contacted Dr. Stanford Breed to let him know as well.

## 2019-02-18 ENCOUNTER — Other Ambulatory Visit (INDEPENDENT_AMBULATORY_CARE_PROVIDER_SITE_OTHER): Payer: Medicare Other

## 2019-02-18 ENCOUNTER — Telehealth: Payer: Self-pay | Admitting: Family Medicine

## 2019-02-18 DIAGNOSIS — I10 Essential (primary) hypertension: Secondary | ICD-10-CM

## 2019-02-18 DIAGNOSIS — R0609 Other forms of dyspnea: Secondary | ICD-10-CM

## 2019-02-18 DIAGNOSIS — R0602 Shortness of breath: Secondary | ICD-10-CM

## 2019-02-18 DIAGNOSIS — E7800 Pure hypercholesterolemia, unspecified: Secondary | ICD-10-CM

## 2019-02-18 DIAGNOSIS — I25810 Atherosclerosis of coronary artery bypass graft(s) without angina pectoris: Secondary | ICD-10-CM

## 2019-02-18 DIAGNOSIS — I2510 Atherosclerotic heart disease of native coronary artery without angina pectoris: Secondary | ICD-10-CM

## 2019-02-18 NOTE — Telephone Encounter (Signed)
Copied from [place] [id number]. Topic: General - Inquiry >> Feb 18, 2019 11:31 AM Scherrie Gerlach wrote:  Reason for CRM: pt's daughter states pt has been diagnosed with congestive heart failure.  She would like to speak with Bernard Byrd first and give him heads up about FMLA paperwork she will be submitting. And talk about her father

## 2019-02-18 NOTE — Telephone Encounter (Signed)
Spoke to [place].  Tried to get them set up for virtual appointment but [name] declined at this time.  Gave her the fax number to have papers faxed.  Will give to [facility] to see if he will fill out.  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

## 2019-02-22 ENCOUNTER — Ambulatory Visit (HOSPITAL_COMMUNITY): Payer: Medicare Other | Attending: Internal Medicine

## 2019-02-22 ENCOUNTER — Other Ambulatory Visit: Payer: Self-pay

## 2019-02-22 DIAGNOSIS — R0609 Other forms of dyspnea: Secondary | ICD-10-CM

## 2019-02-23 ENCOUNTER — Telehealth: Payer: Self-pay | Admitting: *Deleted

## 2019-02-23 ENCOUNTER — Ambulatory Visit: Payer: Medicare Other | Admitting: Cardiology

## 2019-02-23 NOTE — Telephone Encounter (Addendum)
Ov 03/05/2019 with hao meng pa  ----- Message from Lelon Perla, MD sent at 02/23/2019  6:41 AM EDT ----- Would treat medically; make sure he has fuov with me Kirk Ruths

## 2019-02-25 ENCOUNTER — Telehealth: Payer: Self-pay | Admitting: Cardiology

## 2019-02-25 NOTE — Telephone Encounter (Signed)
Called patient daughter- advised that results were not read by MD yet- but patient would be called when results were ready. Daughter now wants to hear directly from MD, advised that normally they do not do that, but I would route a message to his nurse for her to make her aware.  Daughter states she will send a Pharmacist, community message. Advised that she could do either, and we would get results as soon as we had them, daughter verbalized understanding.

## 2019-02-25 NOTE — Telephone Encounter (Signed)
New Message    Patient's daughter calling for Echo results.

## 2019-02-26 NOTE — Telephone Encounter (Signed)
Dr Stanford Breed has spoken with daughter.

## 2019-03-01 ENCOUNTER — Other Ambulatory Visit: Payer: Self-pay | Admitting: Cardiology

## 2019-03-01 DIAGNOSIS — H16142 Punctate keratitis, left eye: Secondary | ICD-10-CM | POA: Diagnosis not present

## 2019-03-01 MED ORDER — FUROSEMIDE 20 MG PO TABS: 20.0000 mg | ORAL_TABLET | Freq: Every day | ORAL | 0 refills | Status: DC

## 2019-03-01 NOTE — Telephone Encounter (Signed)
Lasix 20 mg daily refilled.

## 2019-03-01 NOTE — Telephone Encounter (Signed)
° ° °*  STAT* If patient is at the pharmacy, call can be transferred to refill team.   1. Which medications need to be refilled? (please list name of each medication and dose if known)   furosemide (LASIX) 20 MG tablet  2. Which pharmacy/location (including street and city if local pharmacy) is medication to be sent to?   [id number] - [place], [place] - [address]  3. Do they need a 30 day or 90 day supply? 30  Patient will be out of his medication on Wednesday. Daughter says he can not wait until his appt on Friday to get a new rx

## 2019-03-04 ENCOUNTER — Telehealth: Payer: Self-pay | Admitting: Physician Assistant

## 2019-03-04 NOTE — Telephone Encounter (Signed)
New Message        COVID-19 Pre-Screening Questions:   In the past 7 to 10 days have you had a cough,  shortness of breath, headache, congestion, fever (100 or greater) body aches, chills, sore throat, or sudden loss of taste or sense of smell?   NO  Have you been around anyone with known Covid 19. NO  Have you been around anyone who is awaiting Covid 19 test results in the past 7 to 10 days? NO  Have you been around anyone who has been exposed to Covid 19, or has mentioned symptoms of Covid 19 within the past 7 to 10 days? NO Pts daughter is bringing pt for his appt and answers NO to all questions   If you have any concerns/questions about symptoms patients report during screening (either on the phone or at threshold). Contact the provider seeing the patient or DOD for further guidance.  If neither are available contact a member of the leadership team.

## 2019-03-05 ENCOUNTER — Ambulatory Visit (INDEPENDENT_AMBULATORY_CARE_PROVIDER_SITE_OTHER): Payer: Medicare Other | Admitting: Physician Assistant

## 2019-03-05 ENCOUNTER — Ambulatory Visit: Payer: Medicare Other | Admitting: Physician Assistant

## 2019-03-05 ENCOUNTER — Encounter: Payer: Self-pay | Admitting: Physician Assistant

## 2019-03-05 ENCOUNTER — Other Ambulatory Visit: Payer: Self-pay

## 2019-03-05 VITALS — BP 108/66 | HR 81 | Ht 71.0 in | Wt 190.0 lb

## 2019-03-05 DIAGNOSIS — I25810 Atherosclerosis of coronary artery bypass graft(s) without angina pectoris: Secondary | ICD-10-CM | POA: Diagnosis not present

## 2019-03-05 DIAGNOSIS — E7800 Pure hypercholesterolemia, unspecified: Secondary | ICD-10-CM

## 2019-03-05 DIAGNOSIS — J90 Pleural effusion, not elsewhere classified: Secondary | ICD-10-CM

## 2019-03-05 DIAGNOSIS — I5033 Acute on chronic diastolic (congestive) heart failure: Secondary | ICD-10-CM

## 2019-03-05 DIAGNOSIS — I2510 Atherosclerotic heart disease of native coronary artery without angina pectoris: Secondary | ICD-10-CM

## 2019-03-05 DIAGNOSIS — I10 Essential (primary) hypertension: Secondary | ICD-10-CM

## 2019-03-05 DIAGNOSIS — I2583 Coronary atherosclerosis due to lipid rich plaque: Secondary | ICD-10-CM

## 2019-03-05 NOTE — Progress Notes (Signed)
Cardiology Office Note    Date:  03/07/2019   ID:  Bernard Byrd, DOB Jan 21, 1931, MRN [id number]  PCP:  Dorothyann Peng, NP  Cardiologist:  Dr. Stanford Breed  Chief Complaint  Patient presents with  . Follow-up    seen for Dr. Stanford Breed.     History of Present Illness:  Bernard Byrd is a 85 y.o. male with PMH of hypertension, CAD s/p CABG x 5 2008, hepatic steatosis, cholelithiasis, nephrolithiasis, and hyperlipidemia.  He had a questionable CVA after CABG.  ABI in 2012 were normal.  Abdominal CT in December 2015 showed no aneurysm.  Myoview in December 2016 showed inferior scar but no ischemia, EF 57%.  Patient was last seen by Dr. Stanford Breed in April 2019 at which time he was doing well.  More recently, patient was seen by Coletta Memos NP on 02/16/2019 with lower extremity edema and shortness of breath for 2 months.  He was started on Lasix 20 mg daily.  BNP was borderline at 137.7.  Both physical exam and the subsequent chest x-ray confirms a large left pleural effusion.  Given his advanced age, he would prefer to avoid any and all invasive work-up.  His daughter who is an NP also agree as well.  Subsequent echocardiogram obtained on 02/23/2019 shows EF 60 to 65% mild aortic stenosis thickening of the mitral valve leaflet tips which may represent atypical distribution of degenerative calcification versus endocarditis.  It was recommended to consider TEE or further laboratory work-up if clinically indicated.  Patient presents today for cardiology office visit.  He denies any recent fever, chill, or cough.  After he was placed on the diuretic, his lower extremity edema has completely resolved.  He continued to have diminished breath sounds in the left base and likely has at least a moderate pleural effusion.  He is due for basic metabolic panel to follow-up on his renal function and electrolyte.  I will obtain a repeat chest x-ray.  If his pleural effusion started to improve on the diuretic, I likely  will continue him on the 20 mg daily of Lasix.  However if renal function decline, I will change him to as needed dose of diuretic.  He has a follow-up with Dr. Stanford Breed in August.   Past Medical History:  Diagnosis Date  . CAD (coronary artery disease)    CABG 2008:  myoview  2010 showed no ischemia and a normal EF.  Nuclear study 11/2011: EF 55%. There was a prior inferior basal infarct but no ischemia.  07/2015 no change on repeat nuclear study.  . CVA (cerebral vascular accident) ([place])    possible/no residual deficit--following CABG   . Diverticulosis of rectosigmoid    Noted on CT 04/2011 done to eval chronic groin pain  . DVT (deep venous thrombosis) ([place]) 2009   Post-op from laparoscopic inguinal hernia repair  . Groin pain, left lower quadrant    Chronic, intermittent: believed to be due to scarring around MESH placed at inguinal hernia repair  . Hyperlipidemia   . Hypertension   . Melanoma ([place])    skin graft surgery required  . MELANOMA, HX OF 12/02/2008  . MI (myocardial infarction) ([place]) 10/14/2006   Inferior.    . Nephrolithiasis   . Nuclear sclerotic cataract of both eyes 2015  . Osteoarthritis    Primarily hips  . Prostate nodule    multiple benign biopsies (Dr Rosana Hoes)  . Retroperitoneal hematoma 2009   on lovenox; greenfield filter placed after this  Past Surgical History:  Procedure Laterality Date  . BILATERAL ANTERIOR TOTAL HIP ARTHROPLASTY     One in 2004 and one in 2011  . brain hemorrhage     at age 25  . CARDIOVASCULAR STRESS TEST  11/2011; 07/2015   2013 Nuclear study: EF 55%. There was a prior inferior basal infarct but no ischemia.  No change 2016.  . carpal tunnel repair B Bilateral   . CATARACT EXTRACTION    . COLONOSCOPY W/ POLYPECTOMY  02/07/09   Adenomatous polyps, severe diverticulosis, internal hemorrhoids.  Repeat 2015---however, pt declines any further colon cancer screening  . greenfield filter    . HERNIA REPAIR  05/20/2008   open, X 3  .  kidney stone removal  01/18/1988   x 5   . MELANOMA EXCISION  08/1990   left shoulder - skin graft from leg  . ROTATOR CUFF REPAIR Bilateral   . status post coronary bypass graft  10/14/06    (LIMA to the LAD, saphenous vein graft to the diagonal, saphenous vein graft to the mid obtuse marginal, saphenous vein graft placed in a sequential fashion ot the RV branch of the right coronary artery and distal right coronary artery  . trigger finger X 3      Current Medications: Outpatient Medications Prior to Visit  Medication Sig Dispense Refill  . amLODipine (NORVASC) 10 MG tablet Take 1 tablet (10 mg total) by mouth daily. 90 tablet 2  . aspirin 325 MG tablet Take 325 mg by mouth daily.      Marland Kitchen atorvastatin (LIPITOR) 10 MG tablet TAKE 1 TABLET BY MOUTH EVERY DAY (Patient taking differently: Take 5 mg by mouth. ) 90 tablet 1  . docusate sodium (COLACE) 50 MG capsule Take 100 mg by mouth 2 (two) times daily. Reported on 11/29/2015    . furosemide (LASIX) 20 MG tablet Take 1 tablet (20 mg total) by mouth daily for 14 days. 30 tablet 0  . ibuprofen (ADVIL,MOTRIN) 800 MG tablet TAKE 1 TABLET (800 MG TOTAL) BY MOUTH DAILY. 90 tablet 1  . metoprolol tartrate (LOPRESSOR) 50 MG tablet TAKE 1 TABLET BY MOUTH TWICE A DAY 180 tablet 0  . Multiple Vitamin (MULTIVITAMIN) tablet Take 1 tablet by mouth daily.       No facility-administered medications prior to visit.      Allergies:   Clindamycin palmitate hcl, Clindamycin/lincomycin, Penicillins, Sulfa antibiotics, Other, and Sulfonamide derivatives   Social History   Socioeconomic History  . Marital status: Married    Spouse name: Not on file  . Number of children: Not on file  . Years of education: Not on file  . Highest education level: Not on file  Occupational History  . Not on file  Social Needs  . Financial resource strain: Not on file  . Food insecurity    Worry: Not on file    Inability: Not on file  . Transportation needs    Medical:  Not on file    Non-medical: Not on file  Tobacco Use  . Smoking status: Never Smoker  . Smokeless tobacco: Never Used  Substance and Sexual Activity  . Alcohol use: No  . Drug use: No  . Sexual activity: Not on file  Lifestyle  . Physical activity    Days per week: Not on file    Minutes per session: Not on file  . Stress: Not on file  Relationships  . Social connections    Talks on phone: Not on file  Gets together: Not on file    Attends religious service: Not on file    Active member of club or organization: Not on file    Attends meetings of clubs or organizations: Not on file    Relationship status: Not on file  Other Topics Concern  . 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   Retired Risk analyst.   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     Family History:  The patient's family history includes Cancer (age of onset: 97) in his mother; Heart attack (age of onset: 31) in his father; Heart disease in his brother and father.   ROS:   Please see the history of present illness.    ROS All other systems reviewed and are negative.   PHYSICAL EXAM:   VS:  BP 108/66   Pulse 81   Ht 5\' 11"  (1.803 m)   Wt 190 lb (86.2 kg)   SpO2 96%   BMI 26.50 kg/m    GEN: Well nourished, well developed, in no acute distress  HEENT: normal  Neck: no JVD, carotid bruits, or masses Cardiac: RRR; no murmurs, rubs, or gallops,no edema  Respiratory:  Diminished breath sound on the left GI: soft, nontender, nondistended, + BS MS: no deformity or atrophy  Skin: warm and dry, no rash Neuro:  Alert and Oriented x 3, Strength and sensation are intact Psych: euthymic mood, full affect  Wt Readings from Last 3 Encounters:  03/05/19 190 lb (86.2 kg)  02/16/19 194 lb (88 kg)  04/01/18 221 lb (100.2 kg)      Studies/Labs Reviewed:   EKG:  EKG is not ordered today.    Recent Labs: 04/03/2018: ALT 50 02/16/2019: BNP 137.7; Hemoglobin 14.4; Platelets 162; TSH  3.730 03/05/2019: BUN 10; Creatinine, Ser 0.96; Potassium 4.2; Sodium 137   Lipid Panel    Component Value Date/Time   CHOL 132 12/30/2017 0931   TRIG 117 12/30/2017 0931   HDL 44 12/30/2017 0931   CHOLHDL 3.0 12/30/2017 0931   CHOLHDL 2.9 04/26/2016 0859   VLDL 29 04/26/2016 0859   LDLCALC 65 12/30/2017 0931   LDLDIRECT 150.7 03/02/2009 1015    Additional studies/ records that were reviewed today include:   Echo 02/22/2019  1. The left ventricle has normal systolic function with an ejection fraction of 60-65%. The cavity size was normal. There is moderately increased left ventricular wall thickness. Left ventricular diastolic Doppler parameters are consistent with impaired  relaxation. Indeterminate filling pressures.  2. The right ventricle has normal systolic function. The cavity was normal. There is no increase in right ventricular wall thickness. Right ventricular systolic pressure could not be assessed.  3. The aortic valve is abnormal. Moderate calcification of the aortic valve. Mild stenosis of the aortic valve. Mean systolic gradient 11 mmHg, though may be underestimated due to Doppler alignment.  4. Thickening of mitral valve leaflets tips. Consider differential diagnosis which includes nonbacteriral thrombotic endocarditis, infectious endocarditis, atypical distribution of degenerative calcification. Consider TEE and/or further laboratory  workup if clinically indicated.  5. The mitral valve is abnormal. No evidence of mitral valve stenosis.   ASSESSMENT:    1. Acute on chronic congestive heart failure with left ventricular diastolic dysfunction ([place])   2. Pleural effusion, left   3. Coronary artery disease involving coronary bypass graft of native heart without angina pectoris   4. Essential hypertension   5. Pure hypercholesterolemia  PLAN:  In order of problems listed above:  1. Acute on chronic diastolic heart failure: Recent echocardiogram was reassuring.   Although echocardiogram mentioned thickening of the mitral valve leaflet, this is more likely to be degenerative calcification rather than endocarditis.  Patient does not have any fever, chill, or cough.  Both the patient and his daughter prefer more conservative approach.  I will obtain a basic metabolic panel today, if renal function is stable, will likely continue to 20 mg daily of diuretic.  2. Left pleural effusion: We will repeat the chest x-ray, if the size of the pleural effusion has improved, I would not recommend thoracentesis.  If the size of the pleural effusion is the same as the previous chest x-ray or larger, then consider thoracentesis  3. CAD s/p CABG: Continue aspirin and Lipitor  4. Hypertension: Blood pressure stable on current therapy.  5. Hyperlipidemia: Continue Lipitor 5 mg daily   Medication Adjustments/Labs and Tests Ordered: Current medicines are reviewed at length with the patient today.  Concerns regarding medicines are outlined above.  Medication changes, Labs and Tests ordered today are listed in the Patient Instructions below. Patient Instructions  Medication Instructions:  Your physician recommends that you continue on your current medications as directed. Please refer to the Current Medication list given to you today.  If you need a refill on your cardiac medications before your next appointment, please call your pharmacy.   Lab work: You will need to have labs (blood work) drawn today:  BMET If you have labs (blood work) drawn today and your tests are completely normal, you will receive your results only by: Marland Kitchen MyChart Message (if you have MyChart) OR . A paper copy in the mail If you have any lab test that is abnormal or we need to change your treatment, we will call you to review the results.  Testing/Procedures: A chest x-ray takes a picture of the organs and structures inside the chest, including the heart, lungs, and blood vessels. This test can  show several things, including, whether the heart is enlarges; whether fluid is building up in the lungs; and whether pacemaker / defibrillator leads are still in place.  Follow-Up: At [facility], you and your health needs are our priority.  As part of our continuing mission to provide you with exceptional heart care, we have created designated Provider Care Teams.  These Care Teams include your primary Cardiologist (physician) and Advanced Practice Providers (APPs -  Physician Assistants and Nurse Practitioners) who all work together to provide you with the care you need, when you need it. You will need to keep your scheduled follow up appointment with Kirk Ruths, MD   Any Other Special Instructions Will Be Listed Below (If Applicable).      Hilbert Corrigan, [place]  03/07/2019 9:30 PM    [place], [place], [place]  [address] Phone: [phone]; Fax: [phone]

## 2019-03-05 NOTE — Progress Notes (Signed)
Created in error

## 2019-03-05 NOTE — Patient Instructions (Addendum)
Medication Instructions:  Your physician recommends that you continue on your current medications as directed. Please refer to the Current Medication list given to you today.  If you need a refill on your cardiac medications before your next appointment, please call your pharmacy.   Lab work: You will need to have labs (blood work) drawn today:  BMET If you have labs (blood work) drawn today and your tests are completely normal, you will receive your results only by: Marland Kitchen MyChart Message (if you have MyChart) OR . A paper copy in the mail If you have any lab test that is abnormal or we need to change your treatment, we will call you to review the results.  Testing/Procedures: A chest x-ray takes a picture of the organs and structures inside the chest, including the heart, lungs, and blood vessels. This test can show several things, including, whether the heart is enlarges; whether fluid is building up in the lungs; and whether pacemaker / defibrillator leads are still in place.  Follow-Up: At [facility], you and your health needs are our priority.  As part of our continuing mission to provide you with exceptional heart care, we have created designated Provider Care Teams.  These Care Teams include your primary Cardiologist (physician) and Advanced Practice Providers (APPs -  Physician Assistants and Nurse Practitioners) who all work together to provide you with the care you need, when you need it. You will need to keep your scheduled follow up appointment with Kirk Ruths, MD   Any Other Special Instructions Will Be Listed Below (If Applicable).

## 2019-03-06 LAB — BASIC METABOLIC PANEL
BUN/Creatinine Ratio: 10 (ref 10–24)
BUN: 10 mg/dL (ref 8–27)
CO2: 19 mmol/L — ABNORMAL LOW (ref 20–29)
Calcium: 8.8 mg/dL (ref 8.6–10.2)
Chloride: 101 mmol/L (ref 96–106)
Creatinine, Ser: 0.96 mg/dL (ref 0.76–1.27)
GFR calc Af Amer: 82 mL/min/{1.73_m2} (ref >59)
GFR calc non Af Amer: 71 mL/min/{1.73_m2} (ref >59)
Glucose: 107 mg/dL — ABNORMAL HIGH (ref 65–99)
Potassium: 4.2 mmol/L (ref 3.5–5.2)
Sodium: 137 mmol/L (ref 134–144)

## 2019-03-07 ENCOUNTER — Encounter: Payer: Self-pay | Admitting: Physician Assistant

## 2019-03-07 NOTE — Progress Notes (Signed)
Renal function and electrolyte ok.

## 2019-03-08 ENCOUNTER — Ambulatory Visit
Admission: RE | Admit: 2019-03-08 | Discharge: 2019-03-08 | Disposition: A | Discharge status: Home / Self Care | Payer: Medicare Other | Source: Ambulatory Visit | Attending: Physician Assistant | Admitting: Physician Assistant

## 2019-03-08 DIAGNOSIS — I517 Cardiomegaly: Secondary | ICD-10-CM | POA: Diagnosis not present

## 2019-03-08 DIAGNOSIS — J90 Pleural effusion, not elsewhere classified: Secondary | ICD-10-CM | POA: Diagnosis not present

## 2019-03-08 NOTE — Progress Notes (Signed)
Size of the pleural effusion did not seems to have changed much when compare to the previous chest x ray. I have called and discussed with Rylei Codispoti, the patient's daughter, who says he is not short of breath at this point and doing very good symptom-wise. Dr. Stanford Breed, please review, at what point would you recommend thoracentesis.

## 2019-03-08 NOTE — Progress Notes (Signed)
The patient has been notified of the result and verbalized understanding.  All questions (if any) were answered. Bernard Byrd, [place] 03/08/2019 4:56 PM

## 2019-03-10 ENCOUNTER — Other Ambulatory Visit: Payer: Self-pay | Admitting: Physician Assistant

## 2019-03-22 DIAGNOSIS — H16142 Punctate keratitis, left eye: Secondary | ICD-10-CM | POA: Diagnosis not present

## 2019-03-24 NOTE — Progress Notes (Signed)
Virtual Visit via Video Note   This visit type was conducted due to national recommendations for restrictions regarding the COVID-19 Pandemic (e.g. social distancing) in an effort to limit this patient's exposure and mitigate transmission in our community.  Due to his co-morbid illnesses, this patient is at least at moderate risk for complications without adequate follow up.  This format is felt to be most appropriate for this patient at this time.  All issues noted in this document were discussed and addressed.  A limited physical exam was performed with this format.  Please refer to the patient's chart for his consent to telehealth for [facility].   Date:  03/25/2019   ID:  Bernard Byrd, DOB 1931-02-18, MRN [id number]  Patient Location:Home Provider Location: Home  PCP:  Dorothyann Peng, NP  Cardiologist:  Dr Stanford Breed  Evaluation Performed:  Follow-Up Visit  Chief Complaint:  FU CAD and CHF  History of Present Illness:    FU CAD. H/o inferior MI s/p CABG x 5 in 2/08 w/ ? CVA post CABG. ABIs in August of 2010 were normal. Abdominal CT December 2015 showed no aneurysm. Nuclear study December 2016 showed inferior scar but no ischemia. Ejection fraction 57%.  Echocardiogram July 2020 showed normal LV function, mild aortic stenosis with mean gradient 11 mmHg, thickened mitral valve leaflet tips.  Chest x-ray July 2020 showed left-sided pleural effusion.  Patient seen by Almyra Deforest with complaints of dyspnea and chest x-ray as outlined above.  Diuretics added.  Note BNP 137.  Patient wanted to avoid invasive evaluation because of his age.  Since I last saw him,  his dyspnea on exertion and pedal edema have improved.  He denies chest pain, palpitations or syncope.  The patient does not have symptoms concerning for COVID-19 infection (fever, chills, cough, or new shortness of breath).    Past Medical History:  Diagnosis Date  . CAD (coronary artery disease)    CABG 2008:  myoview  2010  showed no ischemia and a normal EF.  Nuclear study 11/2011: EF 55%. There was a prior inferior basal infarct but no ischemia.  07/2015 no change on repeat nuclear study.  . CVA (cerebral vascular accident) ([place])    possible/no residual deficit--following CABG   . Diverticulosis of rectosigmoid    Noted on CT 04/2011 done to eval chronic groin pain  . DVT (deep venous thrombosis) ([place]) 2009   Post-op from laparoscopic inguinal hernia repair  . Groin pain, left lower quadrant    Chronic, intermittent: believed to be due to scarring around MESH placed at inguinal hernia repair  . Hyperlipidemia   . Hypertension   . Melanoma ([place])    skin graft surgery required  . MELANOMA, HX OF 12/02/2008  . MI (myocardial infarction) ([place]) 10/14/2006   Inferior.    . Nephrolithiasis   . Nuclear sclerotic cataract of both eyes 2015  . Osteoarthritis    Primarily hips  . Prostate nodule    multiple benign biopsies (Dr Rosana Hoes)  . Retroperitoneal hematoma 2009   on lovenox; greenfield filter placed after this   Past Surgical History:  Procedure Laterality Date  . BILATERAL ANTERIOR TOTAL HIP ARTHROPLASTY     One in 2004 and one in 2011  . brain hemorrhage     at age 89  . CARDIOVASCULAR STRESS TEST  11/2011; 07/2015   2013 Nuclear study: EF 55%. There was a prior inferior basal infarct but no ischemia.  No change 2016.  . carpal  tunnel repair B Bilateral   . CATARACT EXTRACTION    . COLONOSCOPY W/ POLYPECTOMY  02/07/09   Adenomatous polyps, severe diverticulosis, internal hemorrhoids.  Repeat 2015---however, pt declines any further colon cancer screening  . greenfield filter    . HERNIA REPAIR  05/20/2008   open, X 3  . kidney stone removal  01/18/1988   x 5   . MELANOMA EXCISION  08/1990   left shoulder - skin graft from leg  . ROTATOR CUFF REPAIR Bilateral   . status post coronary bypass graft  10/14/06    (LIMA to the LAD, saphenous vein graft to the diagonal, saphenous vein graft to the mid obtuse  marginal, saphenous vein graft placed in a sequential fashion ot the RV branch of the right coronary artery and distal right coronary artery  . trigger finger X 3       Current Meds  Medication Sig  . amLODipine (NORVASC) 10 MG tablet Take 1 tablet (10 mg total) by mouth daily.  Marland Kitchen aspirin 325 MG tablet Take 325 mg by mouth daily.    Marland Kitchen atorvastatin (LIPITOR) 10 MG tablet TAKE 1 TABLET BY MOUTH EVERY DAY (Patient taking differently: Take 5 mg by mouth. )  . docusate sodium (COLACE) 50 MG capsule Take 100 mg by mouth 2 (two) times daily. Reported on 11/29/2015  . furosemide (LASIX) 20 MG tablet TAKE 1 TABLET (20 MG TOTAL) BY MOUTH DAILY FOR 14 DAYS.  Marland Kitchen ibuprofen (ADVIL,MOTRIN) 800 MG tablet TAKE 1 TABLET (800 MG TOTAL) BY MOUTH DAILY. (Patient taking differently: Take 400 mg by mouth daily. )  . metoprolol tartrate (LOPRESSOR) 50 MG tablet TAKE 1 TABLET BY MOUTH TWICE A DAY  . Multiple Vitamin (MULTIVITAMIN) tablet Take 1 tablet by mouth daily.       Allergies:   Clindamycin palmitate hcl, Clindamycin/lincomycin, Penicillins, Sulfa antibiotics, Other, and Sulfonamide derivatives   Social History   Tobacco Use  . Smoking status: Never Smoker  . Smokeless tobacco: Never Used  Substance Use Topics  . Alcohol use: No  . Drug use: No     Family Hx: The patient's family history includes Cancer (age of onset: 13) in his mother; Heart attack (age of onset: 68) in his father; Heart disease in his brother and father.  ROS:   Please see the history of present illness.    No Fever, chills  or productive cough All other systems reviewed and are negative.  Recent Labs: 04/03/2018: ALT 50 02/16/2019: BNP 137.7; Hemoglobin 14.4; Platelets 162; TSH 3.730 03/05/2019: BUN 10; Creatinine, Ser 0.96; Potassium 4.2; Sodium 137   Recent Lipid Panel Lab Results  Component Value Date/Time   CHOL 132 12/30/2017 09:31 AM   TRIG 117 12/30/2017 09:31 AM   HDL 44 12/30/2017 09:31 AM   CHOLHDL 3.0 12/30/2017  09:31 AM   CHOLHDL 2.9 04/26/2016 08:59 AM   LDLCALC 65 12/30/2017 09:31 AM   LDLDIRECT 150.7 03/02/2009 10:15 AM    Wt Readings from Last 3 Encounters:  03/25/19 188 lb (85.3 kg)  03/05/19 190 lb (86.2 kg)  02/16/19 194 lb (88 kg)     Objective:    Vital Signs:  BP 137/77   Ht 5\' 11"  (1.803 m)   Wt 188 lb (85.3 kg)   BMI 26.22 kg/m    VITAL SIGNS:  reviewed NAD Answers questions appropriately Normal affect Remainder of physical examination not performed (telehealth visit; coronavirus pandemic)  ASSESSMENT & PLAN:    1. Left pleural effusion-etiology unclear  but initially felt to be CHF.  His symptoms of dyspnea and lower extremity edema have improved with low-dose Lasix.  We will continue.  We will plan to repeat a PA and lateral chest x-ray in 8 weeks.  If left pleural effusion persists we can consider thoracentesis for diagnostic purposes though patient would likely only want conservative measures based on prior conversations with his daughter. 2. Coronary artery disease-there is no chest pain.  Continue medical therapy with aspirin and statin. 3. Hypertension-patient's blood pressure is controlled.  Continue present medications and follow. 4. Hyperlipidemia-continue low-dose statin.  He did not tolerate high-dose previously.  COVID-19 Education: The importance of social distancing was discussed today.  Time:   Today, I have spent 18 minutes with the patient with telehealth technology discussing the above problems.     Medication Adjustments/Labs and Tests Ordered: Current medicines are reviewed at length with the patient today.  Concerns regarding medicines are outlined above.   Tests Ordered: No orders of the defined types were placed in this encounter.   Medication Changes: No orders of the defined types were placed in this encounter.   Follow Up:  Virtual Visit or In Person in 3 month(s)  Signed, Kirk Ruths, MD  03/25/2019 10:13 AM    [place]

## 2019-03-25 ENCOUNTER — Telehealth (INDEPENDENT_AMBULATORY_CARE_PROVIDER_SITE_OTHER): Payer: Medicare Other | Admitting: Cardiology

## 2019-03-25 ENCOUNTER — Telehealth: Payer: Self-pay | Admitting: Cardiology

## 2019-03-25 ENCOUNTER — Other Ambulatory Visit: Payer: Self-pay

## 2019-03-25 VITALS — BP 137/77 | Ht 71.0 in | Wt 188.0 lb

## 2019-03-25 DIAGNOSIS — I10 Essential (primary) hypertension: Secondary | ICD-10-CM

## 2019-03-25 DIAGNOSIS — I25810 Atherosclerosis of coronary artery bypass graft(s) without angina pectoris: Secondary | ICD-10-CM | POA: Diagnosis not present

## 2019-03-25 DIAGNOSIS — J90 Pleural effusion, not elsewhere classified: Secondary | ICD-10-CM

## 2019-03-25 DIAGNOSIS — I2583 Coronary atherosclerosis due to lipid rich plaque: Secondary | ICD-10-CM

## 2019-03-25 DIAGNOSIS — I2510 Atherosclerotic heart disease of native coronary artery without angina pectoris: Secondary | ICD-10-CM | POA: Diagnosis not present

## 2019-03-25 DIAGNOSIS — E7800 Pure hypercholesterolemia, unspecified: Secondary | ICD-10-CM | POA: Diagnosis not present

## 2019-03-25 NOTE — Telephone Encounter (Signed)
bp noted in ov for today

## 2019-03-25 NOTE — Telephone Encounter (Signed)
  Wife was told to call office with patients BP before his appt today. She states it is 137/77

## 2019-03-25 NOTE — Patient Instructions (Signed)
Medication Instructions:  NO CHANGE If you need a refill on your cardiac medications before your next appointment, please call your pharmacy.   Lab work: If you have labs (blood work) drawn today and your tests are completely normal, you will receive your results only by: Marland Kitchen MyChart Message (if you have MyChart) OR . A paper copy in the mail If you have any lab test that is abnormal or we need to change your treatment, we will call you to review the results.  Testing/Procedures: A chest x-ray takes a picture of the organs and structures inside the chest, including the heart, lungs, and blood vessels. This test can show several things, including, whether the heart is enlarges; whether fluid is building up in the lungs; and whether pacemaker / defibrillator leads are still in place. [address] IMAGING-DO IN 6-8 WEEKS  Follow-Up: At [facility], you and your health needs are our priority.  As part of our continuing mission to provide you with exceptional heart care, we have created designated Provider Care Teams.  These Care Teams include your primary Cardiologist (physician) and Advanced Practice Providers (APPs -  Physician Assistants and Nurse Practitioners) who all work together to provide you with the care you need, when you need it. Your physician recommends that you schedule a follow-up appointment in: [place]

## 2019-03-29 DIAGNOSIS — H16142 Punctate keratitis, left eye: Secondary | ICD-10-CM | POA: Diagnosis not present

## 2019-04-01 ENCOUNTER — Other Ambulatory Visit: Payer: Self-pay | Admitting: Cardiology

## 2019-04-07 ENCOUNTER — Ambulatory Visit: Payer: Medicare Other

## 2019-04-14 ENCOUNTER — Telehealth: Payer: Self-pay | Admitting: Cardiology

## 2019-04-14 ENCOUNTER — Ambulatory Visit
Admission: RE | Admit: 2019-04-14 | Discharge: 2019-04-14 | Disposition: A | Discharge status: Home / Self Care | Payer: Medicare Other | Source: Ambulatory Visit | Attending: Cardiology | Admitting: Cardiology

## 2019-04-14 ENCOUNTER — Other Ambulatory Visit: Payer: Self-pay

## 2019-04-14 DIAGNOSIS — J90 Pleural effusion, not elsewhere classified: Secondary | ICD-10-CM | POA: Diagnosis not present

## 2019-04-14 DIAGNOSIS — R0600 Dyspnea, unspecified: Secondary | ICD-10-CM

## 2019-04-14 MED ORDER — FUROSEMIDE 20 MG PO TABS: 40.0000 mg | ORAL_TABLET | Freq: Every day | ORAL | 3 refills | Status: DC

## 2019-04-14 NOTE — Addendum Note (Signed)
Addended by: Cristopher Estimable on: 04/14/2019 09:41 AM   Modules accepted: Orders

## 2019-04-14 NOTE — Telephone Encounter (Signed)
Did not need this encounter °

## 2019-04-14 NOTE — Telephone Encounter (Signed)
Spoke with pt, every morning he has a little SOB but this morning it seems to be worse. He denies orthopnea and has a little swelling. His weight is stable. This morning he has a congested cough which is new for him. Discussed with dr Stanford Breed, patient to increase furosemide to 40 mg BID for 2 days and then decrease to 40 mg daily. New script sent to the pharmacy and he will have bmp in one week. Lab orders mailed to the pt and he will go get a cxr.

## 2019-04-19 ENCOUNTER — Telehealth: Payer: Self-pay | Admitting: Cardiology

## 2019-04-19 NOTE — Telephone Encounter (Signed)
Follow Up:    Wife says she wanted you to know she have not received pt's ;lab order in the mail.

## 2019-04-19 NOTE — Telephone Encounter (Signed)
Spoke with pt, aware lab work should be in the mail. Patient is feeling better.

## 2019-04-21 DIAGNOSIS — J90 Pleural effusion, not elsewhere classified: Secondary | ICD-10-CM | POA: Diagnosis not present

## 2019-04-21 DIAGNOSIS — R0600 Dyspnea, unspecified: Secondary | ICD-10-CM | POA: Diagnosis not present

## 2019-04-21 LAB — BASIC METABOLIC PANEL
BUN/Creatinine Ratio: 12 (ref 10–24)
BUN: 12 mg/dL (ref 8–27)
CO2: 21 mmol/L (ref 20–29)
Calcium: 8.7 mg/dL (ref 8.6–10.2)
Chloride: 102 mmol/L (ref 96–106)
Creatinine, Ser: 1 mg/dL (ref 0.76–1.27)
GFR calc Af Amer: 78 mL/min/{1.73_m2} (ref >59)
GFR calc non Af Amer: 68 mL/min/{1.73_m2} (ref >59)
Glucose: 107 mg/dL — ABNORMAL HIGH (ref 65–99)
Potassium: 4.1 mmol/L (ref 3.5–5.2)
Sodium: 137 mmol/L (ref 134–144)

## 2019-04-22 ENCOUNTER — Encounter: Payer: Self-pay | Admitting: *Deleted

## 2019-04-25 ENCOUNTER — Other Ambulatory Visit: Payer: Self-pay | Admitting: Adult Health

## 2019-04-28 DIAGNOSIS — H02886 Meibomian gland dysfunction of left eye, unspecified eyelid: Secondary | ICD-10-CM | POA: Diagnosis not present

## 2019-04-28 DIAGNOSIS — H04123 Dry eye syndrome of bilateral lacrimal glands: Secondary | ICD-10-CM | POA: Diagnosis not present

## 2019-04-28 DIAGNOSIS — H18831 Recurrent erosion of cornea, right eye: Secondary | ICD-10-CM | POA: Diagnosis not present

## 2019-04-28 DIAGNOSIS — H02883 Meibomian gland dysfunction of right eye, unspecified eyelid: Secondary | ICD-10-CM | POA: Diagnosis not present

## 2019-05-04 ENCOUNTER — Other Ambulatory Visit: Payer: Self-pay | Admitting: Cardiology

## 2019-05-20 DIAGNOSIS — H16142 Punctate keratitis, left eye: Secondary | ICD-10-CM | POA: Diagnosis not present

## 2019-06-21 DIAGNOSIS — H16142 Punctate keratitis, left eye: Secondary | ICD-10-CM | POA: Diagnosis not present

## 2019-07-05 NOTE — Progress Notes (Deleted)
Virtual Visit via Video Note   This visit type was conducted due to national recommendations for restrictions regarding the COVID-19 Pandemic (e.g. social distancing) in an effort to limit this patient's exposure and mitigate transmission in our community.  Due to his co-morbid illnesses, this patient is at least at moderate risk for complications without adequate follow up.  This format is felt to be most appropriate for this patient at this time.  All issues noted in this document were discussed and addressed.  A limited physical exam was performed with this format.  Please refer to the patient's chart for his consent to telehealth for [facility].   Date:  07/05/2019   ID:  Bernard Byrd, DOB November 05, 1930, MRN [id number]  Patient Location:Home Provider Location: Home  PCP:  Dorothyann Peng, NP  Cardiologist:  Dr Stanford Breed  Evaluation Performed:  Follow-Up Visit  Chief Complaint:  FU CAD  History of Present Illness:    FU CAD. H/o inferior MI s/p CABG x 5 in 2/08 w/ ? CVA post CABG. ABIs in August of 2010 were normal. Abdominal CT December 2015 showed no aneurysm. Nuclear study December 2016 showed inferior scar but no ischemia. Ejection fraction 57%.  Echocardiogram July 2020 showed normal LV function, mild aortic stenosis with mean gradient 11 mmHg, thickened mitral valve leaflet tips.  Chest x-ray July 2020 showed left-sided pleural effusion.  Patient seen by Almyra Deforest with complaints of dyspnea and chest x-ray as outlined above.  Diuretics added.  Note BNP 137.  Patient wanted to avoid invasive evaluation because of his age.  Chest x-ray August 2020 showed moderate size left pleural effusion with probable underlying atelectasis. Since I last saw him,  The patient does not have symptoms concerning for COVID-19 infection (fever, chills, cough, or new shortness of breath).    Past Medical History:  Diagnosis Date  . CAD (coronary artery disease)    CABG 2008:  myoview  2010 showed  no ischemia and a normal EF.  Nuclear study 11/2011: EF 55%. There was a prior inferior basal infarct but no ischemia.  07/2015 no change on repeat nuclear study.  . CVA (cerebral vascular accident) ([place])    possible/no residual deficit--following CABG   . Diverticulosis of rectosigmoid    Noted on CT 04/2011 done to eval chronic groin pain  . DVT (deep venous thrombosis) ([place]) 2009   Post-op from laparoscopic inguinal hernia repair  . Groin pain, left lower quadrant    Chronic, intermittent: believed to be due to scarring around MESH placed at inguinal hernia repair  . Hyperlipidemia   . Hypertension   . Melanoma ([place])    skin graft surgery required  . MELANOMA, HX OF 12/02/2008  . MI (myocardial infarction) ([place]) 10/14/2006   Inferior.    . Nephrolithiasis   . Nuclear sclerotic cataract of both eyes 2015  . Osteoarthritis    Primarily hips  . Prostate nodule    multiple benign biopsies (Dr Rosana Hoes)  . Retroperitoneal hematoma 2009   on lovenox; greenfield filter placed after this   Past Surgical History:  Procedure Laterality Date  . BILATERAL ANTERIOR TOTAL HIP ARTHROPLASTY     One in 2004 and one in 2011  . brain hemorrhage     at age 54  . CARDIOVASCULAR STRESS TEST  11/2011; 07/2015   2013 Nuclear study: EF 55%. There was a prior inferior basal infarct but no ischemia.  No change 2016.  . carpal tunnel repair B Bilateral   .  CATARACT EXTRACTION    . COLONOSCOPY W/ POLYPECTOMY  02/07/09   Adenomatous polyps, severe diverticulosis, internal hemorrhoids.  Repeat 2015---however, pt declines any further colon cancer screening  . greenfield filter    . HERNIA REPAIR  05/20/2008   open, X 3  . kidney stone removal  01/18/1988   x 5   . MELANOMA EXCISION  08/1990   left shoulder - skin graft from leg  . ROTATOR CUFF REPAIR Bilateral   . status post coronary bypass graft  10/14/06    (LIMA to the LAD, saphenous vein graft to the diagonal, saphenous vein graft to the mid obtuse  marginal, saphenous vein graft placed in a sequential fashion ot the RV branch of the right coronary artery and distal right coronary artery  . trigger finger X 3       No outpatient medications have been marked as taking for the 07/09/19 encounter (Appointment) with Lelon Perla, MD.     Allergies:   Clindamycin palmitate hcl, Clindamycin/lincomycin, Penicillins, Sulfa antibiotics, Other, and Sulfonamide derivatives   Social History   Tobacco Use  . Smoking status: Never Smoker  . Smokeless tobacco: Never Used  Substance Use Topics  . Alcohol use: No  . Drug use: No     Family Hx: The patient's family history includes Cancer (age of onset: 35) in his mother; Heart attack (age of onset: 9) in his father; Heart disease in his brother and father.  ROS:   Please see the history of present illness.    No Fever, chills  or productive cough All other systems reviewed and are negative.   Recent Labs: 02/16/2019: BNP 137.7; Hemoglobin 14.4; Platelets 162; TSH 3.730 04/21/2019: BUN 12; Creatinine, Ser 1.00; Potassium 4.1; Sodium 137   Recent Lipid Panel Lab Results  Component Value Date/Time   CHOL 132 12/30/2017 09:31 AM   TRIG 117 12/30/2017 09:31 AM   HDL 44 12/30/2017 09:31 AM   CHOLHDL 3.0 12/30/2017 09:31 AM   CHOLHDL 2.9 04/26/2016 08:59 AM   LDLCALC 65 12/30/2017 09:31 AM   LDLDIRECT 150.7 03/02/2009 10:15 AM    Wt Readings from Last 3 Encounters:  03/25/19 188 lb (85.3 kg)  03/05/19 190 lb (86.2 kg)  02/16/19 194 lb (88 kg)     Objective:    Vital Signs:  There were no vitals taken for this visit.   VITAL SIGNS:  reviewed NAD Answers questions appropriately Normal affect Remainder of physical examination not performed (telehealth visit; coronavirus pandemic)  ASSESSMENT & PLAN:    1. Left pleural effusion-etiology of effusion unclear.  His symptoms are well controlled with low-dose Lasix.  We will continue.  Check potassium and renal function.  We  discussed thoracentesis today for diagnostic purposes but patient only wants conservative measures. 2. Coronary artery disease-patient denies chest pain.  Continue medical therapy with aspirin and statin. 3. Hypertension-blood pressure controlled.  Continue present medical regimen. 4. Hyperlipidemia-continue low-dose statin.  He did not tolerate high-dose previously.  COVID-19 Education: The importance of social distancing was discussed today.  Time:   Today, I have spent 18 minutes with the patient with telehealth technology discussing the above problems.     Medication Adjustments/Labs and Tests Ordered: Current medicines are reviewed at length with the patient today.  Concerns regarding medicines are outlined above.   Tests Ordered: No orders of the defined types were placed in this encounter.   Medication Changes: No orders of the defined types were placed in this encounter.  Follow Up:  Either In Person or Virtual in 6 month(s)  Signed, Kirk Ruths, MD  07/05/2019 7:41 AM    [place]

## 2019-07-09 ENCOUNTER — Telehealth: Payer: Medicare Other | Admitting: Cardiology

## 2019-07-13 ENCOUNTER — Other Ambulatory Visit: Payer: Self-pay | Admitting: Physician Assistant

## 2019-07-13 DIAGNOSIS — R0600 Dyspnea, unspecified: Secondary | ICD-10-CM

## 2019-07-13 DIAGNOSIS — J90 Pleural effusion, not elsewhere classified: Secondary | ICD-10-CM

## 2019-07-26 DIAGNOSIS — H16142 Punctate keratitis, left eye: Secondary | ICD-10-CM | POA: Diagnosis not present

## 2019-09-21 DIAGNOSIS — H16142 Punctate keratitis, left eye: Secondary | ICD-10-CM | POA: Diagnosis not present

## 2019-10-22 DIAGNOSIS — H16142 Punctate keratitis, left eye: Secondary | ICD-10-CM | POA: Diagnosis not present

## 2019-10-22 DIAGNOSIS — H18831 Recurrent erosion of cornea, right eye: Secondary | ICD-10-CM | POA: Diagnosis not present

## 2019-10-28 ENCOUNTER — Other Ambulatory Visit: Payer: Self-pay | Admitting: Cardiology

## 2019-10-28 DIAGNOSIS — H04123 Dry eye syndrome of bilateral lacrimal glands: Secondary | ICD-10-CM | POA: Diagnosis not present

## 2019-10-28 DIAGNOSIS — H16142 Punctate keratitis, left eye: Secondary | ICD-10-CM | POA: Diagnosis not present

## 2019-12-29 ENCOUNTER — Other Ambulatory Visit: Payer: Self-pay | Admitting: Cardiology

## 2019-12-30 ENCOUNTER — Telehealth: Payer: Self-pay | Admitting: Adult Health

## 2019-12-30 NOTE — Telephone Encounter (Signed)
Pt's daughter, [name], would like information on how to go about getting help with her father around the house. Pt's daughter, [name], is concerned because his health is declining and her mom can't take of pt by herself. Thanks

## 2019-12-30 NOTE — Telephone Encounter (Signed)
Appointment scheduled. Nothing further needed at this time.  

## 2019-12-30 NOTE — Telephone Encounter (Signed)
It would be good to see him.  But in regards to getting help around the house, they would have to check into his insurance, likely if they are looking for extended care outside of a couple weeks then will be through an agency for a home health aide and this is usually an out-of-pocket expense

## 2019-12-30 NOTE — Telephone Encounter (Signed)
If he is declining that much we can definitely do a hospice referral to see what they think.

## 2019-12-30 NOTE — Telephone Encounter (Signed)
Spoke to [place].  She is a Designer, jewellery.  She has noticed a decline in her father.  He has lost a significant amount of weight.  Is hardly able to walk more than 20 feet at a time.  Has to have help with bathing and dressing.  He currently lives at home with his wife who is taking care of him.  She is finding it hard to care for him as his needs become more and more dependent.  [name] has tried to get him to come to see [name] many times but he has refused.  [name] states he is being a "tough guy."  [name] reports some worsening cardiac issues.  [name] thought that Hospice/Palliative care may be appropriate but also thought maybe only a nurse aid is needed.  She reinterated that she doesn't think he will come for an appointment and she doesn't think that she can get him here.  Advised a virtual appointment so that Wonda Cheng and Mrs. Cavin can discuss what to do in the near future.  [name] did want [name] to know that Mr. Yglesias will most likely act like a "tough guy."  Asked that he notice the weight loss and weakness.  Informed [name] that I will advise [name] of all.  Schedule for 01/04/2020 at the end of day.  Will forward to [facility] as [place].

## 2020-01-04 ENCOUNTER — Telehealth: Payer: Medicare Other | Admitting: Adult Health

## 2020-01-10 ENCOUNTER — Other Ambulatory Visit: Payer: Self-pay | Admitting: Cardiology

## 2020-01-10 ENCOUNTER — Other Ambulatory Visit: Payer: Self-pay | Admitting: Adult Health

## 2020-02-07 ENCOUNTER — Telehealth: Payer: Self-pay | Admitting: Adult Health

## 2020-02-07 NOTE — Telephone Encounter (Signed)
Bernard Byrd with [name], is requesting a hospice referral and to service hospice attending. Bernard Byrd, is hoping this will be approved today so pt could be seen today. Pt is aware that [name] is out today. Thanks

## 2020-02-10 NOTE — Telephone Encounter (Signed)
Ok for referral?

## 2020-02-15 NOTE — Telephone Encounter (Signed)
Referral faxed to [facility].  Received confirmation the fax was successful.  Nothing further needed.

## 2020-02-17 DIAGNOSIS — I69318 Other symptoms and signs involving cognitive functions following cerebral infarction: Secondary | ICD-10-CM | POA: Diagnosis not present

## 2020-02-17 DIAGNOSIS — I110 Hypertensive heart disease with heart failure: Secondary | ICD-10-CM | POA: Diagnosis not present

## 2020-02-17 DIAGNOSIS — R634 Abnormal weight loss: Secondary | ICD-10-CM | POA: Diagnosis not present

## 2020-02-17 DIAGNOSIS — I25709 Atherosclerosis of coronary artery bypass graft(s), unspecified, with unspecified angina pectoris: Secondary | ICD-10-CM | POA: Diagnosis not present

## 2020-02-17 DIAGNOSIS — I080 Rheumatic disorders of both mitral and aortic valves: Secondary | ICD-10-CM | POA: Diagnosis not present

## 2020-02-17 DIAGNOSIS — R630 Anorexia: Secondary | ICD-10-CM | POA: Diagnosis not present

## 2020-02-17 DIAGNOSIS — E785 Hyperlipidemia, unspecified: Secondary | ICD-10-CM | POA: Diagnosis not present

## 2020-02-17 DIAGNOSIS — I509 Heart failure, unspecified: Secondary | ICD-10-CM | POA: Diagnosis not present

## 2020-02-17 DIAGNOSIS — I252 Old myocardial infarction: Secondary | ICD-10-CM | POA: Diagnosis not present

## 2020-02-17 DIAGNOSIS — J90 Pleural effusion, not elsewhere classified: Secondary | ICD-10-CM | POA: Diagnosis not present

## 2020-02-18 DIAGNOSIS — J90 Pleural effusion, not elsewhere classified: Secondary | ICD-10-CM | POA: Diagnosis not present

## 2020-02-18 DIAGNOSIS — R630 Anorexia: Secondary | ICD-10-CM | POA: Diagnosis not present

## 2020-02-18 DIAGNOSIS — I110 Hypertensive heart disease with heart failure: Secondary | ICD-10-CM | POA: Diagnosis not present

## 2020-02-18 DIAGNOSIS — I509 Heart failure, unspecified: Secondary | ICD-10-CM | POA: Diagnosis not present

## 2020-02-18 DIAGNOSIS — I252 Old myocardial infarction: Secondary | ICD-10-CM | POA: Diagnosis not present

## 2020-02-18 DIAGNOSIS — R634 Abnormal weight loss: Secondary | ICD-10-CM | POA: Diagnosis not present

## 2020-02-24 DIAGNOSIS — I110 Hypertensive heart disease with heart failure: Secondary | ICD-10-CM | POA: Diagnosis not present

## 2020-02-24 DIAGNOSIS — I252 Old myocardial infarction: Secondary | ICD-10-CM | POA: Diagnosis not present

## 2020-02-24 DIAGNOSIS — I509 Heart failure, unspecified: Secondary | ICD-10-CM | POA: Diagnosis not present

## 2020-02-24 DIAGNOSIS — R634 Abnormal weight loss: Secondary | ICD-10-CM | POA: Diagnosis not present

## 2020-02-24 DIAGNOSIS — J90 Pleural effusion, not elsewhere classified: Secondary | ICD-10-CM | POA: Diagnosis not present

## 2020-02-24 DIAGNOSIS — R630 Anorexia: Secondary | ICD-10-CM | POA: Diagnosis not present

## 2020-03-02 DIAGNOSIS — R634 Abnormal weight loss: Secondary | ICD-10-CM | POA: Diagnosis not present

## 2020-03-02 DIAGNOSIS — I252 Old myocardial infarction: Secondary | ICD-10-CM | POA: Diagnosis not present

## 2020-03-02 DIAGNOSIS — J90 Pleural effusion, not elsewhere classified: Secondary | ICD-10-CM | POA: Diagnosis not present

## 2020-03-02 DIAGNOSIS — R630 Anorexia: Secondary | ICD-10-CM | POA: Diagnosis not present

## 2020-03-02 DIAGNOSIS — I110 Hypertensive heart disease with heart failure: Secondary | ICD-10-CM | POA: Diagnosis not present

## 2020-03-02 DIAGNOSIS — I509 Heart failure, unspecified: Secondary | ICD-10-CM | POA: Diagnosis not present

## 2020-03-09 DIAGNOSIS — J90 Pleural effusion, not elsewhere classified: Secondary | ICD-10-CM | POA: Diagnosis not present

## 2020-03-09 DIAGNOSIS — R630 Anorexia: Secondary | ICD-10-CM | POA: Diagnosis not present

## 2020-03-09 DIAGNOSIS — I252 Old myocardial infarction: Secondary | ICD-10-CM | POA: Diagnosis not present

## 2020-03-09 DIAGNOSIS — R634 Abnormal weight loss: Secondary | ICD-10-CM | POA: Diagnosis not present

## 2020-03-09 DIAGNOSIS — I509 Heart failure, unspecified: Secondary | ICD-10-CM | POA: Diagnosis not present

## 2020-03-09 DIAGNOSIS — I110 Hypertensive heart disease with heart failure: Secondary | ICD-10-CM | POA: Diagnosis not present

## 2020-03-13 DIAGNOSIS — J90 Pleural effusion, not elsewhere classified: Secondary | ICD-10-CM | POA: Diagnosis not present

## 2020-03-13 DIAGNOSIS — I509 Heart failure, unspecified: Secondary | ICD-10-CM | POA: Diagnosis not present

## 2020-03-13 DIAGNOSIS — R630 Anorexia: Secondary | ICD-10-CM | POA: Diagnosis not present

## 2020-03-13 DIAGNOSIS — R634 Abnormal weight loss: Secondary | ICD-10-CM | POA: Diagnosis not present

## 2020-03-13 DIAGNOSIS — I110 Hypertensive heart disease with heart failure: Secondary | ICD-10-CM | POA: Diagnosis not present

## 2020-03-13 DIAGNOSIS — I252 Old myocardial infarction: Secondary | ICD-10-CM | POA: Diagnosis not present

## 2020-03-16 DIAGNOSIS — I252 Old myocardial infarction: Secondary | ICD-10-CM | POA: Diagnosis not present

## 2020-03-16 DIAGNOSIS — R634 Abnormal weight loss: Secondary | ICD-10-CM | POA: Diagnosis not present

## 2020-03-16 DIAGNOSIS — I110 Hypertensive heart disease with heart failure: Secondary | ICD-10-CM | POA: Diagnosis not present

## 2020-03-16 DIAGNOSIS — R630 Anorexia: Secondary | ICD-10-CM | POA: Diagnosis not present

## 2020-03-16 DIAGNOSIS — I509 Heart failure, unspecified: Secondary | ICD-10-CM | POA: Diagnosis not present

## 2020-03-16 DIAGNOSIS — J90 Pleural effusion, not elsewhere classified: Secondary | ICD-10-CM | POA: Diagnosis not present

## 2020-03-19 DIAGNOSIS — I25709 Atherosclerosis of coronary artery bypass graft(s), unspecified, with unspecified angina pectoris: Secondary | ICD-10-CM | POA: Diagnosis not present

## 2020-03-19 DIAGNOSIS — I69318 Other symptoms and signs involving cognitive functions following cerebral infarction: Secondary | ICD-10-CM | POA: Diagnosis not present

## 2020-03-19 DIAGNOSIS — I252 Old myocardial infarction: Secondary | ICD-10-CM | POA: Diagnosis not present

## 2020-03-19 DIAGNOSIS — R634 Abnormal weight loss: Secondary | ICD-10-CM | POA: Diagnosis not present

## 2020-03-19 DIAGNOSIS — R630 Anorexia: Secondary | ICD-10-CM | POA: Diagnosis not present

## 2020-03-19 DIAGNOSIS — I080 Rheumatic disorders of both mitral and aortic valves: Secondary | ICD-10-CM | POA: Diagnosis not present

## 2020-03-19 DIAGNOSIS — E785 Hyperlipidemia, unspecified: Secondary | ICD-10-CM | POA: Diagnosis not present

## 2020-03-19 DIAGNOSIS — I509 Heart failure, unspecified: Secondary | ICD-10-CM | POA: Diagnosis not present

## 2020-03-19 DIAGNOSIS — I110 Hypertensive heart disease with heart failure: Secondary | ICD-10-CM | POA: Diagnosis not present

## 2020-03-19 DIAGNOSIS — J90 Pleural effusion, not elsewhere classified: Secondary | ICD-10-CM | POA: Diagnosis not present

## 2020-03-20 DIAGNOSIS — I252 Old myocardial infarction: Secondary | ICD-10-CM | POA: Diagnosis not present

## 2020-03-20 DIAGNOSIS — R630 Anorexia: Secondary | ICD-10-CM | POA: Diagnosis not present

## 2020-03-20 DIAGNOSIS — R634 Abnormal weight loss: Secondary | ICD-10-CM | POA: Diagnosis not present

## 2020-03-20 DIAGNOSIS — J90 Pleural effusion, not elsewhere classified: Secondary | ICD-10-CM | POA: Diagnosis not present

## 2020-03-20 DIAGNOSIS — I110 Hypertensive heart disease with heart failure: Secondary | ICD-10-CM | POA: Diagnosis not present

## 2020-03-20 DIAGNOSIS — I509 Heart failure, unspecified: Secondary | ICD-10-CM | POA: Diagnosis not present

## 2020-03-21 DIAGNOSIS — I509 Heart failure, unspecified: Secondary | ICD-10-CM | POA: Diagnosis not present

## 2020-03-21 DIAGNOSIS — R634 Abnormal weight loss: Secondary | ICD-10-CM | POA: Diagnosis not present

## 2020-03-21 DIAGNOSIS — I252 Old myocardial infarction: Secondary | ICD-10-CM | POA: Diagnosis not present

## 2020-03-21 DIAGNOSIS — J90 Pleural effusion, not elsewhere classified: Secondary | ICD-10-CM | POA: Diagnosis not present

## 2020-03-21 DIAGNOSIS — I110 Hypertensive heart disease with heart failure: Secondary | ICD-10-CM | POA: Diagnosis not present

## 2020-03-21 DIAGNOSIS — R630 Anorexia: Secondary | ICD-10-CM | POA: Diagnosis not present

## 2020-03-23 DIAGNOSIS — R634 Abnormal weight loss: Secondary | ICD-10-CM | POA: Diagnosis not present

## 2020-03-23 DIAGNOSIS — J90 Pleural effusion, not elsewhere classified: Secondary | ICD-10-CM | POA: Diagnosis not present

## 2020-03-23 DIAGNOSIS — R630 Anorexia: Secondary | ICD-10-CM | POA: Diagnosis not present

## 2020-03-23 DIAGNOSIS — I252 Old myocardial infarction: Secondary | ICD-10-CM | POA: Diagnosis not present

## 2020-03-23 DIAGNOSIS — I110 Hypertensive heart disease with heart failure: Secondary | ICD-10-CM | POA: Diagnosis not present

## 2020-03-23 DIAGNOSIS — I509 Heart failure, unspecified: Secondary | ICD-10-CM | POA: Diagnosis not present

## 2020-03-30 DIAGNOSIS — I509 Heart failure, unspecified: Secondary | ICD-10-CM | POA: Diagnosis not present

## 2020-03-30 DIAGNOSIS — R634 Abnormal weight loss: Secondary | ICD-10-CM | POA: Diagnosis not present

## 2020-03-30 DIAGNOSIS — J90 Pleural effusion, not elsewhere classified: Secondary | ICD-10-CM | POA: Diagnosis not present

## 2020-03-30 DIAGNOSIS — I110 Hypertensive heart disease with heart failure: Secondary | ICD-10-CM | POA: Diagnosis not present

## 2020-03-30 DIAGNOSIS — R630 Anorexia: Secondary | ICD-10-CM | POA: Diagnosis not present

## 2020-03-30 DIAGNOSIS — I252 Old myocardial infarction: Secondary | ICD-10-CM | POA: Diagnosis not present

## 2020-04-05 ENCOUNTER — Other Ambulatory Visit: Payer: Self-pay | Admitting: Cardiology

## 2020-04-05 DIAGNOSIS — J90 Pleural effusion, not elsewhere classified: Secondary | ICD-10-CM | POA: Diagnosis not present

## 2020-04-05 DIAGNOSIS — I509 Heart failure, unspecified: Secondary | ICD-10-CM | POA: Diagnosis not present

## 2020-04-05 DIAGNOSIS — R634 Abnormal weight loss: Secondary | ICD-10-CM | POA: Diagnosis not present

## 2020-04-05 DIAGNOSIS — I110 Hypertensive heart disease with heart failure: Secondary | ICD-10-CM | POA: Diagnosis not present

## 2020-04-05 DIAGNOSIS — I252 Old myocardial infarction: Secondary | ICD-10-CM | POA: Diagnosis not present

## 2020-04-05 DIAGNOSIS — R630 Anorexia: Secondary | ICD-10-CM | POA: Diagnosis not present

## 2020-04-06 DIAGNOSIS — I509 Heart failure, unspecified: Secondary | ICD-10-CM | POA: Diagnosis not present

## 2020-04-06 DIAGNOSIS — R630 Anorexia: Secondary | ICD-10-CM | POA: Diagnosis not present

## 2020-04-06 DIAGNOSIS — J90 Pleural effusion, not elsewhere classified: Secondary | ICD-10-CM | POA: Diagnosis not present

## 2020-04-06 DIAGNOSIS — R634 Abnormal weight loss: Secondary | ICD-10-CM | POA: Diagnosis not present

## 2020-04-06 DIAGNOSIS — I252 Old myocardial infarction: Secondary | ICD-10-CM | POA: Diagnosis not present

## 2020-04-06 DIAGNOSIS — I110 Hypertensive heart disease with heart failure: Secondary | ICD-10-CM | POA: Diagnosis not present

## 2020-04-19 DIAGNOSIS — E785 Hyperlipidemia, unspecified: Secondary | ICD-10-CM | POA: Diagnosis not present

## 2020-04-19 DIAGNOSIS — I252 Old myocardial infarction: Secondary | ICD-10-CM | POA: Diagnosis not present

## 2020-04-19 DIAGNOSIS — I69318 Other symptoms and signs involving cognitive functions following cerebral infarction: Secondary | ICD-10-CM | POA: Diagnosis not present

## 2020-04-19 DIAGNOSIS — R630 Anorexia: Secondary | ICD-10-CM | POA: Diagnosis not present

## 2020-04-19 DIAGNOSIS — I110 Hypertensive heart disease with heart failure: Secondary | ICD-10-CM | POA: Diagnosis not present

## 2020-04-19 DIAGNOSIS — I509 Heart failure, unspecified: Secondary | ICD-10-CM | POA: Diagnosis not present

## 2020-04-19 DIAGNOSIS — J90 Pleural effusion, not elsewhere classified: Secondary | ICD-10-CM | POA: Diagnosis not present

## 2020-04-19 DIAGNOSIS — I080 Rheumatic disorders of both mitral and aortic valves: Secondary | ICD-10-CM | POA: Diagnosis not present

## 2020-04-19 DIAGNOSIS — R634 Abnormal weight loss: Secondary | ICD-10-CM | POA: Diagnosis not present

## 2020-04-19 DIAGNOSIS — I25709 Atherosclerosis of coronary artery bypass graft(s), unspecified, with unspecified angina pectoris: Secondary | ICD-10-CM | POA: Diagnosis not present

## 2020-04-27 DIAGNOSIS — I252 Old myocardial infarction: Secondary | ICD-10-CM | POA: Diagnosis not present

## 2020-04-27 DIAGNOSIS — I110 Hypertensive heart disease with heart failure: Secondary | ICD-10-CM | POA: Diagnosis not present

## 2020-04-27 DIAGNOSIS — R634 Abnormal weight loss: Secondary | ICD-10-CM | POA: Diagnosis not present

## 2020-04-27 DIAGNOSIS — I509 Heart failure, unspecified: Secondary | ICD-10-CM | POA: Diagnosis not present

## 2020-04-27 DIAGNOSIS — J90 Pleural effusion, not elsewhere classified: Secondary | ICD-10-CM | POA: Diagnosis not present

## 2020-04-27 DIAGNOSIS — R630 Anorexia: Secondary | ICD-10-CM | POA: Diagnosis not present

## 2020-05-03 DIAGNOSIS — I509 Heart failure, unspecified: Secondary | ICD-10-CM | POA: Diagnosis not present

## 2020-05-03 DIAGNOSIS — J90 Pleural effusion, not elsewhere classified: Secondary | ICD-10-CM | POA: Diagnosis not present

## 2020-05-03 DIAGNOSIS — R630 Anorexia: Secondary | ICD-10-CM | POA: Diagnosis not present

## 2020-05-03 DIAGNOSIS — I110 Hypertensive heart disease with heart failure: Secondary | ICD-10-CM | POA: Diagnosis not present

## 2020-05-03 DIAGNOSIS — R634 Abnormal weight loss: Secondary | ICD-10-CM | POA: Diagnosis not present

## 2020-05-03 DIAGNOSIS — I252 Old myocardial infarction: Secondary | ICD-10-CM | POA: Diagnosis not present

## 2020-05-05 DIAGNOSIS — I110 Hypertensive heart disease with heart failure: Secondary | ICD-10-CM | POA: Diagnosis not present

## 2020-05-05 DIAGNOSIS — J90 Pleural effusion, not elsewhere classified: Secondary | ICD-10-CM | POA: Diagnosis not present

## 2020-05-05 DIAGNOSIS — R630 Anorexia: Secondary | ICD-10-CM | POA: Diagnosis not present

## 2020-05-05 DIAGNOSIS — R634 Abnormal weight loss: Secondary | ICD-10-CM | POA: Diagnosis not present

## 2020-05-05 DIAGNOSIS — I252 Old myocardial infarction: Secondary | ICD-10-CM | POA: Diagnosis not present

## 2020-05-05 DIAGNOSIS — I509 Heart failure, unspecified: Secondary | ICD-10-CM | POA: Diagnosis not present

## 2020-05-12 DIAGNOSIS — R634 Abnormal weight loss: Secondary | ICD-10-CM | POA: Diagnosis not present

## 2020-05-12 DIAGNOSIS — I110 Hypertensive heart disease with heart failure: Secondary | ICD-10-CM | POA: Diagnosis not present

## 2020-05-12 DIAGNOSIS — I252 Old myocardial infarction: Secondary | ICD-10-CM | POA: Diagnosis not present

## 2020-05-12 DIAGNOSIS — I509 Heart failure, unspecified: Secondary | ICD-10-CM | POA: Diagnosis not present

## 2020-05-12 DIAGNOSIS — J90 Pleural effusion, not elsewhere classified: Secondary | ICD-10-CM | POA: Diagnosis not present

## 2020-05-12 DIAGNOSIS — R630 Anorexia: Secondary | ICD-10-CM | POA: Diagnosis not present

## 2020-05-16 ENCOUNTER — Other Ambulatory Visit: Payer: Self-pay | Admitting: Cardiology

## 2020-05-17 DIAGNOSIS — I252 Old myocardial infarction: Secondary | ICD-10-CM | POA: Diagnosis not present

## 2020-05-17 DIAGNOSIS — I110 Hypertensive heart disease with heart failure: Secondary | ICD-10-CM | POA: Diagnosis not present

## 2020-05-17 DIAGNOSIS — J90 Pleural effusion, not elsewhere classified: Secondary | ICD-10-CM | POA: Diagnosis not present

## 2020-05-17 DIAGNOSIS — R634 Abnormal weight loss: Secondary | ICD-10-CM | POA: Diagnosis not present

## 2020-05-17 DIAGNOSIS — I509 Heart failure, unspecified: Secondary | ICD-10-CM | POA: Diagnosis not present

## 2020-05-17 DIAGNOSIS — R630 Anorexia: Secondary | ICD-10-CM | POA: Diagnosis not present

## 2020-05-18 ENCOUNTER — Other Ambulatory Visit: Payer: Self-pay | Admitting: Cardiology

## 2020-05-19 DIAGNOSIS — I080 Rheumatic disorders of both mitral and aortic valves: Secondary | ICD-10-CM | POA: Diagnosis not present

## 2020-05-19 DIAGNOSIS — I69318 Other symptoms and signs involving cognitive functions following cerebral infarction: Secondary | ICD-10-CM | POA: Diagnosis not present

## 2020-05-19 DIAGNOSIS — R634 Abnormal weight loss: Secondary | ICD-10-CM | POA: Diagnosis not present

## 2020-05-19 DIAGNOSIS — I25709 Atherosclerosis of coronary artery bypass graft(s), unspecified, with unspecified angina pectoris: Secondary | ICD-10-CM | POA: Diagnosis not present

## 2020-05-19 DIAGNOSIS — I252 Old myocardial infarction: Secondary | ICD-10-CM | POA: Diagnosis not present

## 2020-05-19 DIAGNOSIS — R630 Anorexia: Secondary | ICD-10-CM | POA: Diagnosis not present

## 2020-05-19 DIAGNOSIS — J90 Pleural effusion, not elsewhere classified: Secondary | ICD-10-CM | POA: Diagnosis not present

## 2020-05-19 DIAGNOSIS — E785 Hyperlipidemia, unspecified: Secondary | ICD-10-CM | POA: Diagnosis not present

## 2020-05-19 DIAGNOSIS — I509 Heart failure, unspecified: Secondary | ICD-10-CM | POA: Diagnosis not present

## 2020-05-19 DIAGNOSIS — I110 Hypertensive heart disease with heart failure: Secondary | ICD-10-CM | POA: Diagnosis not present

## 2020-05-20 DIAGNOSIS — I509 Heart failure, unspecified: Secondary | ICD-10-CM | POA: Diagnosis not present

## 2020-05-20 DIAGNOSIS — M6281 Muscle weakness (generalized): Secondary | ICD-10-CM | POA: Diagnosis not present

## 2020-05-20 DIAGNOSIS — I10 Essential (primary) hypertension: Secondary | ICD-10-CM | POA: Diagnosis not present

## 2020-05-20 DIAGNOSIS — K5909 Other constipation: Secondary | ICD-10-CM | POA: Diagnosis not present

## 2020-05-20 DIAGNOSIS — L89159 Pressure ulcer of sacral region, unspecified stage: Secondary | ICD-10-CM | POA: Diagnosis not present

## 2020-05-20 DIAGNOSIS — Z7902 Long term (current) use of antithrombotics/antiplatelets: Secondary | ICD-10-CM | POA: Diagnosis not present

## 2020-05-23 DIAGNOSIS — Z23 Encounter for immunization: Secondary | ICD-10-CM | POA: Diagnosis not present

## 2020-05-24 DIAGNOSIS — J90 Pleural effusion, not elsewhere classified: Secondary | ICD-10-CM | POA: Diagnosis not present

## 2020-05-24 DIAGNOSIS — I509 Heart failure, unspecified: Secondary | ICD-10-CM | POA: Diagnosis not present

## 2020-05-24 DIAGNOSIS — I110 Hypertensive heart disease with heart failure: Secondary | ICD-10-CM | POA: Diagnosis not present

## 2020-05-24 DIAGNOSIS — R630 Anorexia: Secondary | ICD-10-CM | POA: Diagnosis not present

## 2020-05-24 DIAGNOSIS — I252 Old myocardial infarction: Secondary | ICD-10-CM | POA: Diagnosis not present

## 2020-05-24 DIAGNOSIS — R634 Abnormal weight loss: Secondary | ICD-10-CM | POA: Diagnosis not present

## 2020-05-25 DIAGNOSIS — R630 Anorexia: Secondary | ICD-10-CM | POA: Diagnosis not present

## 2020-05-25 DIAGNOSIS — R634 Abnormal weight loss: Secondary | ICD-10-CM | POA: Diagnosis not present

## 2020-05-25 DIAGNOSIS — J90 Pleural effusion, not elsewhere classified: Secondary | ICD-10-CM | POA: Diagnosis not present

## 2020-05-25 DIAGNOSIS — I110 Hypertensive heart disease with heart failure: Secondary | ICD-10-CM | POA: Diagnosis not present

## 2020-05-25 DIAGNOSIS — I252 Old myocardial infarction: Secondary | ICD-10-CM | POA: Diagnosis not present

## 2020-05-25 DIAGNOSIS — I509 Heart failure, unspecified: Secondary | ICD-10-CM | POA: Diagnosis not present

## 2020-05-26 DIAGNOSIS — R634 Abnormal weight loss: Secondary | ICD-10-CM | POA: Diagnosis not present

## 2020-05-26 DIAGNOSIS — I509 Heart failure, unspecified: Secondary | ICD-10-CM | POA: Diagnosis not present

## 2020-05-26 DIAGNOSIS — R630 Anorexia: Secondary | ICD-10-CM | POA: Diagnosis not present

## 2020-05-26 DIAGNOSIS — I252 Old myocardial infarction: Secondary | ICD-10-CM | POA: Diagnosis not present

## 2020-05-26 DIAGNOSIS — J90 Pleural effusion, not elsewhere classified: Secondary | ICD-10-CM | POA: Diagnosis not present

## 2020-05-26 DIAGNOSIS — I110 Hypertensive heart disease with heart failure: Secondary | ICD-10-CM | POA: Diagnosis not present

## 2020-06-01 DIAGNOSIS — I509 Heart failure, unspecified: Secondary | ICD-10-CM | POA: Diagnosis not present

## 2020-06-01 DIAGNOSIS — I110 Hypertensive heart disease with heart failure: Secondary | ICD-10-CM | POA: Diagnosis not present

## 2020-06-01 DIAGNOSIS — I252 Old myocardial infarction: Secondary | ICD-10-CM | POA: Diagnosis not present

## 2020-06-01 DIAGNOSIS — J90 Pleural effusion, not elsewhere classified: Secondary | ICD-10-CM | POA: Diagnosis not present

## 2020-06-01 DIAGNOSIS — R630 Anorexia: Secondary | ICD-10-CM | POA: Diagnosis not present

## 2020-06-01 DIAGNOSIS — R634 Abnormal weight loss: Secondary | ICD-10-CM | POA: Diagnosis not present

## 2020-06-05 DIAGNOSIS — R630 Anorexia: Secondary | ICD-10-CM | POA: Diagnosis not present

## 2020-06-05 DIAGNOSIS — I509 Heart failure, unspecified: Secondary | ICD-10-CM | POA: Diagnosis not present

## 2020-06-05 DIAGNOSIS — R634 Abnormal weight loss: Secondary | ICD-10-CM | POA: Diagnosis not present

## 2020-06-05 DIAGNOSIS — I252 Old myocardial infarction: Secondary | ICD-10-CM | POA: Diagnosis not present

## 2020-06-05 DIAGNOSIS — I110 Hypertensive heart disease with heart failure: Secondary | ICD-10-CM | POA: Diagnosis not present

## 2020-06-05 DIAGNOSIS — J90 Pleural effusion, not elsewhere classified: Secondary | ICD-10-CM | POA: Diagnosis not present

## 2020-06-07 DIAGNOSIS — I252 Old myocardial infarction: Secondary | ICD-10-CM | POA: Diagnosis not present

## 2020-06-07 DIAGNOSIS — J90 Pleural effusion, not elsewhere classified: Secondary | ICD-10-CM | POA: Diagnosis not present

## 2020-06-07 DIAGNOSIS — I110 Hypertensive heart disease with heart failure: Secondary | ICD-10-CM | POA: Diagnosis not present

## 2020-06-07 DIAGNOSIS — R630 Anorexia: Secondary | ICD-10-CM | POA: Diagnosis not present

## 2020-06-07 DIAGNOSIS — R634 Abnormal weight loss: Secondary | ICD-10-CM | POA: Diagnosis not present

## 2020-06-07 DIAGNOSIS — I509 Heart failure, unspecified: Secondary | ICD-10-CM | POA: Diagnosis not present

## 2020-06-13 DIAGNOSIS — I110 Hypertensive heart disease with heart failure: Secondary | ICD-10-CM | POA: Diagnosis not present

## 2020-06-13 DIAGNOSIS — I252 Old myocardial infarction: Secondary | ICD-10-CM | POA: Diagnosis not present

## 2020-06-13 DIAGNOSIS — I509 Heart failure, unspecified: Secondary | ICD-10-CM | POA: Diagnosis not present

## 2020-06-13 DIAGNOSIS — J90 Pleural effusion, not elsewhere classified: Secondary | ICD-10-CM | POA: Diagnosis not present

## 2020-06-13 DIAGNOSIS — R634 Abnormal weight loss: Secondary | ICD-10-CM | POA: Diagnosis not present

## 2020-06-13 DIAGNOSIS — R630 Anorexia: Secondary | ICD-10-CM | POA: Diagnosis not present

## 2020-06-19 DIAGNOSIS — J90 Pleural effusion, not elsewhere classified: Secondary | ICD-10-CM | POA: Diagnosis not present

## 2020-06-19 DIAGNOSIS — I110 Hypertensive heart disease with heart failure: Secondary | ICD-10-CM | POA: Diagnosis not present

## 2020-06-19 DIAGNOSIS — I25709 Atherosclerosis of coronary artery bypass graft(s), unspecified, with unspecified angina pectoris: Secondary | ICD-10-CM | POA: Diagnosis not present

## 2020-06-19 DIAGNOSIS — I252 Old myocardial infarction: Secondary | ICD-10-CM | POA: Diagnosis not present

## 2020-06-19 DIAGNOSIS — I69318 Other symptoms and signs involving cognitive functions following cerebral infarction: Secondary | ICD-10-CM | POA: Diagnosis not present

## 2020-06-19 DIAGNOSIS — R630 Anorexia: Secondary | ICD-10-CM | POA: Diagnosis not present

## 2020-06-19 DIAGNOSIS — I509 Heart failure, unspecified: Secondary | ICD-10-CM | POA: Diagnosis not present

## 2020-06-19 DIAGNOSIS — I080 Rheumatic disorders of both mitral and aortic valves: Secondary | ICD-10-CM | POA: Diagnosis not present

## 2020-06-19 DIAGNOSIS — E785 Hyperlipidemia, unspecified: Secondary | ICD-10-CM | POA: Diagnosis not present

## 2020-06-19 DIAGNOSIS — R634 Abnormal weight loss: Secondary | ICD-10-CM | POA: Diagnosis not present

## 2020-06-20 DIAGNOSIS — I110 Hypertensive heart disease with heart failure: Secondary | ICD-10-CM | POA: Diagnosis not present

## 2020-06-20 DIAGNOSIS — I509 Heart failure, unspecified: Secondary | ICD-10-CM | POA: Diagnosis not present

## 2020-06-20 DIAGNOSIS — I252 Old myocardial infarction: Secondary | ICD-10-CM | POA: Diagnosis not present

## 2020-06-20 DIAGNOSIS — R630 Anorexia: Secondary | ICD-10-CM | POA: Diagnosis not present

## 2020-06-20 DIAGNOSIS — J90 Pleural effusion, not elsewhere classified: Secondary | ICD-10-CM | POA: Diagnosis not present

## 2020-06-20 DIAGNOSIS — R634 Abnormal weight loss: Secondary | ICD-10-CM | POA: Diagnosis not present

## 2020-06-28 DIAGNOSIS — I110 Hypertensive heart disease with heart failure: Secondary | ICD-10-CM | POA: Diagnosis not present

## 2020-06-28 DIAGNOSIS — J90 Pleural effusion, not elsewhere classified: Secondary | ICD-10-CM | POA: Diagnosis not present

## 2020-06-28 DIAGNOSIS — R634 Abnormal weight loss: Secondary | ICD-10-CM | POA: Diagnosis not present

## 2020-06-28 DIAGNOSIS — I252 Old myocardial infarction: Secondary | ICD-10-CM | POA: Diagnosis not present

## 2020-06-28 DIAGNOSIS — R630 Anorexia: Secondary | ICD-10-CM | POA: Diagnosis not present

## 2020-06-28 DIAGNOSIS — I509 Heart failure, unspecified: Secondary | ICD-10-CM | POA: Diagnosis not present

## 2020-06-29 DIAGNOSIS — R634 Abnormal weight loss: Secondary | ICD-10-CM | POA: Diagnosis not present

## 2020-06-29 DIAGNOSIS — I509 Heart failure, unspecified: Secondary | ICD-10-CM | POA: Diagnosis not present

## 2020-06-29 DIAGNOSIS — I110 Hypertensive heart disease with heart failure: Secondary | ICD-10-CM | POA: Diagnosis not present

## 2020-06-29 DIAGNOSIS — R630 Anorexia: Secondary | ICD-10-CM | POA: Diagnosis not present

## 2020-06-29 DIAGNOSIS — I252 Old myocardial infarction: Secondary | ICD-10-CM | POA: Diagnosis not present

## 2020-06-29 DIAGNOSIS — J90 Pleural effusion, not elsewhere classified: Secondary | ICD-10-CM | POA: Diagnosis not present

## 2020-06-30 ENCOUNTER — Observation Stay (HOSPITAL_COMMUNITY)

## 2020-06-30 ENCOUNTER — Inpatient Hospital Stay (HOSPITAL_COMMUNITY)
Admission: EM | Admit: 2020-06-30 | Discharge: 2020-07-08 | DRG: 291 | Disposition: A | Discharge status: Hospice | Attending: Internal Medicine | Admitting: Internal Medicine

## 2020-06-30 ENCOUNTER — Emergency Department (HOSPITAL_COMMUNITY)

## 2020-06-30 ENCOUNTER — Encounter (HOSPITAL_COMMUNITY): Payer: Self-pay

## 2020-06-30 DIAGNOSIS — E785 Hyperlipidemia, unspecified: Secondary | ICD-10-CM | POA: Diagnosis present

## 2020-06-30 DIAGNOSIS — I10 Essential (primary) hypertension: Secondary | ICD-10-CM | POA: Diagnosis present

## 2020-06-30 DIAGNOSIS — I509 Heart failure, unspecified: Secondary | ICD-10-CM

## 2020-06-30 DIAGNOSIS — R441 Visual hallucinations: Secondary | ICD-10-CM | POA: Diagnosis not present

## 2020-06-30 DIAGNOSIS — M160 Bilateral primary osteoarthritis of hip: Secondary | ICD-10-CM | POA: Diagnosis present

## 2020-06-30 DIAGNOSIS — R634 Abnormal weight loss: Secondary | ICD-10-CM | POA: Diagnosis not present

## 2020-06-30 DIAGNOSIS — N182 Chronic kidney disease, stage 2 (mild): Secondary | ICD-10-CM | POA: Diagnosis present

## 2020-06-30 DIAGNOSIS — I959 Hypotension, unspecified: Secondary | ICD-10-CM | POA: Diagnosis not present

## 2020-06-30 DIAGNOSIS — Z7982 Long term (current) use of aspirin: Secondary | ICD-10-CM

## 2020-06-30 DIAGNOSIS — R001 Bradycardia, unspecified: Secondary | ICD-10-CM | POA: Diagnosis not present

## 2020-06-30 DIAGNOSIS — I5033 Acute on chronic diastolic (congestive) heart failure: Secondary | ICD-10-CM | POA: Diagnosis present

## 2020-06-30 DIAGNOSIS — Z66 Do not resuscitate: Secondary | ICD-10-CM | POA: Diagnosis present

## 2020-06-30 DIAGNOSIS — R0902 Hypoxemia: Secondary | ICD-10-CM | POA: Diagnosis not present

## 2020-06-30 DIAGNOSIS — W010XXA Fall on same level from slipping, tripping and stumbling without subsequent striking against object, initial encounter: Secondary | ICD-10-CM | POA: Diagnosis present

## 2020-06-30 DIAGNOSIS — W19XXXA Unspecified fall, initial encounter: Secondary | ICD-10-CM

## 2020-06-30 DIAGNOSIS — Z79899 Other long term (current) drug therapy: Secondary | ICD-10-CM

## 2020-06-30 DIAGNOSIS — R1310 Dysphagia, unspecified: Secondary | ICD-10-CM | POA: Diagnosis present

## 2020-06-30 DIAGNOSIS — R531 Weakness: Secondary | ICD-10-CM

## 2020-06-30 DIAGNOSIS — Z8673 Personal history of transient ischemic attack (TIA), and cerebral infarction without residual deficits: Secondary | ICD-10-CM

## 2020-06-30 DIAGNOSIS — Z880 Allergy status to penicillin: Secondary | ICD-10-CM

## 2020-06-30 DIAGNOSIS — Z85820 Personal history of malignant melanoma of skin: Secondary | ICD-10-CM

## 2020-06-30 DIAGNOSIS — I2510 Atherosclerotic heart disease of native coronary artery without angina pectoris: Secondary | ICD-10-CM | POA: Diagnosis present

## 2020-06-30 DIAGNOSIS — R627 Adult failure to thrive: Secondary | ICD-10-CM | POA: Diagnosis present

## 2020-06-30 DIAGNOSIS — Z96643 Presence of artificial hip joint, bilateral: Secondary | ICD-10-CM | POA: Diagnosis present

## 2020-06-30 DIAGNOSIS — J90 Pleural effusion, not elsewhere classified: Secondary | ICD-10-CM | POA: Diagnosis not present

## 2020-06-30 DIAGNOSIS — I130 Hypertensive heart and chronic kidney disease with heart failure and stage 1 through stage 4 chronic kidney disease, or unspecified chronic kidney disease: Secondary | ICD-10-CM | POA: Diagnosis not present

## 2020-06-30 DIAGNOSIS — M549 Dorsalgia, unspecified: Secondary | ICD-10-CM | POA: Diagnosis present

## 2020-06-30 DIAGNOSIS — Z20822 Contact with and (suspected) exposure to covid-19: Secondary | ICD-10-CM | POA: Diagnosis present

## 2020-06-30 DIAGNOSIS — Z951 Presence of aortocoronary bypass graft: Secondary | ICD-10-CM

## 2020-06-30 DIAGNOSIS — I252 Old myocardial infarction: Secondary | ICD-10-CM

## 2020-06-30 DIAGNOSIS — Y92009 Unspecified place in unspecified non-institutional (private) residence as the place of occurrence of the external cause: Secondary | ICD-10-CM

## 2020-06-30 DIAGNOSIS — K7460 Unspecified cirrhosis of liver: Secondary | ICD-10-CM | POA: Diagnosis present

## 2020-06-30 DIAGNOSIS — L89152 Pressure ulcer of sacral region, stage 2: Secondary | ICD-10-CM | POA: Diagnosis present

## 2020-06-30 DIAGNOSIS — Z87442 Personal history of urinary calculi: Secondary | ICD-10-CM

## 2020-06-30 DIAGNOSIS — I110 Hypertensive heart disease with heart failure: Secondary | ICD-10-CM | POA: Diagnosis not present

## 2020-06-30 DIAGNOSIS — Z515 Encounter for palliative care: Secondary | ICD-10-CM

## 2020-06-30 DIAGNOSIS — Z881 Allergy status to other antibiotic agents status: Secondary | ICD-10-CM

## 2020-06-30 DIAGNOSIS — R188 Other ascites: Secondary | ICD-10-CM | POA: Diagnosis present

## 2020-06-30 DIAGNOSIS — R630 Anorexia: Secondary | ICD-10-CM | POA: Diagnosis not present

## 2020-06-30 DIAGNOSIS — Z8249 Family history of ischemic heart disease and other diseases of the circulatory system: Secondary | ICD-10-CM

## 2020-06-30 DIAGNOSIS — K7469 Other cirrhosis of liver: Secondary | ICD-10-CM

## 2020-06-30 DIAGNOSIS — Z86718 Personal history of other venous thrombosis and embolism: Secondary | ICD-10-CM

## 2020-06-30 DIAGNOSIS — L8990 Pressure ulcer of unspecified site, unspecified stage: Secondary | ICD-10-CM | POA: Insufficient documentation

## 2020-06-30 DIAGNOSIS — Z888 Allergy status to other drugs, medicaments and biological substances status: Secondary | ICD-10-CM

## 2020-06-30 DIAGNOSIS — Z882 Allergy status to sulfonamides status: Secondary | ICD-10-CM

## 2020-06-30 LAB — COMPREHENSIVE METABOLIC PANEL
ALT: 22 U/L (ref 0–44)
AST: 31 U/L (ref 15–41)
Albumin: 2.7 g/dL — ABNORMAL LOW (ref 3.5–5.0)
Alkaline Phosphatase: 194 U/L — ABNORMAL HIGH (ref 38–126)
Anion gap: 11 (ref 5–15)
BUN: 15 mg/dL (ref 8–23)
CO2: 23 mmol/L (ref 22–32)
Calcium: 8.7 mg/dL — ABNORMAL LOW (ref 8.9–10.3)
Chloride: 103 mmol/L (ref 98–111)
Creatinine, Ser: 1.17 mg/dL (ref 0.61–1.24)
GFR, Estimated: >60 mL/min (ref >60)
Glucose, Bld: 108 mg/dL — ABNORMAL HIGH (ref 70–99)
Potassium: 3.7 mmol/L (ref 3.5–5.1)
Sodium: 137 mmol/L (ref 135–145)
Total Bilirubin: 1.1 mg/dL (ref 0.3–1.2)
Total Protein: 7.2 g/dL (ref 6.5–8.1)

## 2020-06-30 LAB — CBC WITH DIFFERENTIAL/PLATELET
Abs Immature Granulocytes: 0.04 10*3/uL (ref 0.00–0.07)
Basophils Absolute: 0 10*3/uL (ref 0.0–0.1)
Basophils Relative: 1 %
Eosinophils Absolute: 0.2 10*3/uL (ref 0.0–0.5)
Eosinophils Relative: 2 %
HCT: 41.3 % (ref 39.0–52.0)
Hemoglobin: 13.5 g/dL (ref 13.0–17.0)
Immature Granulocytes: 1 %
Lymphocytes Relative: 27 %
Lymphs Abs: 2.2 10*3/uL (ref 0.7–4.0)
MCH: 34 pg (ref 26.0–34.0)
MCHC: 32.7 g/dL (ref 30.0–36.0)
MCV: 104 fL — ABNORMAL HIGH (ref 80.0–100.0)
Monocytes Absolute: 0.8 10*3/uL (ref 0.1–1.0)
Monocytes Relative: 9 %
Neutro Abs: 5.1 10*3/uL (ref 1.7–7.7)
Neutrophils Relative %: 60 %
Platelets: 167 10*3/uL (ref 150–400)
RBC: 3.97 MIL/uL — ABNORMAL LOW (ref 4.22–5.81)
RDW: 15.2 % (ref 11.5–15.5)
WBC: 8.3 10*3/uL (ref 4.0–10.5)
nRBC: 0 % (ref 0.0–0.2)

## 2020-06-30 LAB — BRAIN NATRIURETIC PEPTIDE: B Natriuretic Peptide: 226.3 pg/mL — ABNORMAL HIGH (ref 0.0–100.0)

## 2020-06-30 LAB — RESP PANEL BY RT PCR (RSV, FLU A&B, COVID)
Influenza A by PCR: NEGATIVE
Influenza B by PCR: NEGATIVE
Respiratory Syncytial Virus by PCR: NEGATIVE
SARS Coronavirus 2 by RT PCR: NEGATIVE

## 2020-06-30 MED ORDER — FENTANYL CITRATE (PF) 100 MCG/2ML IJ SOLN: 12.5000 ug | Freq: Once | INTRAMUSCULAR | Status: DC

## 2020-06-30 MED ORDER — AMLODIPINE BESYLATE 10 MG PO TABS
10.0000 mg | ORAL_TABLET | Freq: Every day | ORAL | Status: DC
  Administered 2020-07-01 – 2020-07-08 (×8): 10 mg via ORAL
  Filled 2020-06-30 (×8): qty 1

## 2020-06-30 MED ORDER — HYDROCODONE-ACETAMINOPHEN 5-325 MG PO TABS
1.0000 | ORAL_TABLET | Freq: Four times a day (QID) | ORAL | Status: DC | PRN
  Administered 2020-06-30 – 2020-07-02 (×5): 1 via ORAL
  Filled 2020-06-30 (×6): qty 1

## 2020-06-30 MED ORDER — FUROSEMIDE 10 MG/ML IJ SOLN
40.0000 mg | Freq: Two times a day (BID) | INTRAMUSCULAR | Status: DC
  Administered 2020-07-01 – 2020-07-03 (×5): 40 mg via INTRAVENOUS
  Filled 2020-06-30 (×5): qty 4

## 2020-06-30 MED ORDER — METOPROLOL TARTRATE 50 MG PO TABS
50.0000 mg | ORAL_TABLET | Freq: Two times a day (BID) | ORAL | Status: DC
  Administered 2020-06-30 – 2020-07-01 (×2): 50 mg via ORAL
  Filled 2020-06-30 (×3): qty 1

## 2020-06-30 MED ORDER — ACETAMINOPHEN 650 MG RE SUPP: 650.0000 mg | Freq: Four times a day (QID) | RECTAL | Status: DC | PRN

## 2020-06-30 MED ORDER — ENOXAPARIN SODIUM 40 MG/0.4ML ~~LOC~~ SOLN
40.0000 mg | Freq: Every day | SUBCUTANEOUS | Status: DC
  Filled 2020-06-30 (×2): qty 0.4

## 2020-06-30 MED ORDER — ASPIRIN EC 81 MG PO TBEC
81.0000 mg | DELAYED_RELEASE_TABLET | Freq: Every day | ORAL | Status: DC
  Administered 2020-07-01 – 2020-07-08 (×8): 81 mg via ORAL
  Filled 2020-06-30 (×8): qty 1

## 2020-06-30 MED ORDER — ONDANSETRON HCL 4 MG/2ML IJ SOLN: 4.0000 mg | Freq: Four times a day (QID) | INTRAMUSCULAR | Status: DC | PRN

## 2020-06-30 MED ORDER — FUROSEMIDE 10 MG/ML IJ SOLN
40.0000 mg | Freq: Once | INTRAMUSCULAR | Status: AC
  Administered 2020-06-30: 40 mg via INTRAVENOUS
  Filled 2020-06-30: qty 4

## 2020-06-30 MED ORDER — POLYVINYL ALCOHOL 1.4 % OP SOLN: 1.0000 [drp] | Freq: Every day | OPHTHALMIC | Status: DC | PRN

## 2020-06-30 MED ORDER — FENTANYL CITRATE (PF) 100 MCG/2ML IJ SOLN: 25.0000 ug | Freq: Once | INTRAMUSCULAR | Status: DC

## 2020-06-30 MED ORDER — ONDANSETRON HCL 4 MG PO TABS: 4.0000 mg | ORAL_TABLET | Freq: Four times a day (QID) | ORAL | Status: DC | PRN

## 2020-06-30 MED ORDER — SENNOSIDES-DOCUSATE SODIUM 8.6-50 MG PO TABS
1.0000 | ORAL_TABLET | Freq: Two times a day (BID) | ORAL | Status: DC
  Administered 2020-06-30 – 2020-07-08 (×12): 1 via ORAL
  Filled 2020-06-30 (×16): qty 1

## 2020-06-30 MED ORDER — HYDROCODONE-ACETAMINOPHEN 5-325 MG PO TABS
1.0000 | ORAL_TABLET | Freq: Once | ORAL | Status: AC
  Administered 2020-06-30: 1 via ORAL
  Filled 2020-06-30: qty 1

## 2020-06-30 MED ORDER — ACETAMINOPHEN 325 MG PO TABS: 650.0000 mg | ORAL_TABLET | Freq: Four times a day (QID) | ORAL | Status: DC | PRN

## 2020-06-30 MED ORDER — FENTANYL CITRATE (PF) 100 MCG/2ML IJ SOLN
12.5000 ug | Freq: Once | INTRAMUSCULAR | Status: AC
  Administered 2020-06-30: 12.5 ug via INTRAMUSCULAR
  Filled 2020-06-30: qty 2

## 2020-06-30 NOTE — ED Triage Notes (Signed)
Pt arrived to ED via EMS from home. Pt went to get out of bed yesterday morning and fell hitting back against nightstand. Pt reports pain to back, bilateral hips and lower legs. States has had increasing bilaterally leg weakness for months which has caused increasing falls at home. Pt with abrasion to mid thoracic area . Pt is able to move lower extremities

## 2020-06-30 NOTE — ED Provider Notes (Signed)
[facility] EMERGENCY DEPARTMENT Provider Note   CSN: [id number] Arrival date & time: 06/30/20  1257     History Chief Complaint  Patient presents with  . Fall    Bernard Byrd is a 86 y.o. male.  HPI  Patient presents after fall that occurred yesterday, now with concern for ongoing back pain, diminished ambulatory capacity. Initially the patient was alone, but he subsequently joined by his daughter, a Designer, jewellery, as well as his wife, and as discussed this case with our hospice colleagues.  The patient is in hospice care due to history of CHF, recent decline in condition overall. Yesterday the patient had mechanical fall, slipping, striking his back. Since that time he has had increasing discomfort throughout the thoracic area. Patient has baseline dyspnea, this is seemingly unchanged, though he is noted to be a minimizer terms of his history and complaints. Pain is sore, severe, worse with motion, not improved with OTC medication. No report of additional falls, no report of head trauma, patient denies any head pain, neck pain, nausea, vomiting.   Past Medical History:  Diagnosis Date  . CAD (coronary artery disease)    CABG 2008:  myoview  2010 showed no ischemia and a normal EF.  Nuclear study 11/2011: EF 55%. There was a prior inferior basal infarct but no ischemia.  07/2015 no change on repeat nuclear study.  . CVA (cerebral vascular accident) ([place])    possible/no residual deficit--following CABG   . Diverticulosis of rectosigmoid    Noted on CT 04/2011 done to eval chronic groin pain  . DVT (deep venous thrombosis) ([place]) 2009   Post-op from laparoscopic inguinal hernia repair  . Groin pain, left lower quadrant    Chronic, intermittent: believed to be due to scarring around MESH placed at inguinal hernia repair  . Hyperlipidemia   . Hypertension   . Melanoma ([place])    skin graft surgery required  . MELANOMA, HX OF 12/02/2008  . MI (myocardial  infarction) ([place]) 10/14/2006   Inferior.    . Nephrolithiasis   . Nuclear sclerotic cataract of both eyes 2015  . Osteoarthritis    Primarily hips  . Prostate nodule    multiple benign biopsies (Dr Rosana Hoes)  . Retroperitoneal hematoma 2009   on lovenox; greenfield filter placed after this    Patient Active Problem List   Diagnosis Date Noted  . Encounter for Medicare annual wellness exam 06/11/2016  . Chest pain 07/19/2015  . H/O total hip arthroplasty 12/12/2014  . Kidney stone 08/08/2014  . Renal colic 38/46/6599  . Nephrolithiasis 08/07/2014  . Hepatic steatosis 08/07/2014  . Cholelithiasis 08/07/2014  . CAD (coronary artery disease) 09/15/2013  . Cervical spine pain 09/11/2012  . 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  . Health maintenance examination 09/14/2011  . Hyperlipidemia 09/14/2011  . HTN (hypertension), benign 09/14/2011  . Macrocytosis without anemia 09/14/2011  . DYSPNEA 07/26/2009  . LEG PAIN, LEFT 03/16/2009  . OSTEOARTHRITIS, HIP, RIGHT 12/02/2008    Past Surgical History:  Procedure Laterality Date  . BILATERAL ANTERIOR TOTAL HIP ARTHROPLASTY     One in 2004 and one in 2011  . brain hemorrhage     at age 78  . CARDIOVASCULAR STRESS TEST  11/2011; 07/2015   2013 Nuclear study: EF 55%. There was a prior inferior basal infarct but no ischemia.  No change 2016.  . carpal tunnel repair B Bilateral   . CATARACT EXTRACTION    . COLONOSCOPY W/ POLYPECTOMY  02/07/09  Adenomatous polyps, severe diverticulosis, internal hemorrhoids.  Repeat 2015---however, pt declines any further colon cancer screening  . greenfield filter    . HERNIA REPAIR  05/20/2008   open, X 3  . kidney stone removal  01/18/1988   x 5   . MELANOMA EXCISION  08/1990   left shoulder - skin graft from leg  . ROTATOR CUFF REPAIR Bilateral   . status post coronary bypass graft  10/14/06    (LIMA to the LAD, saphenous vein graft to the diagonal, saphenous vein graft to the mid obtuse marginal, saphenous  vein graft placed in a sequential fashion ot the RV branch of the right coronary artery and distal right coronary artery  . trigger finger X 3         Family History  Problem Relation Age of Onset  . Cancer Mother 63       uterine and ovarian   . Heart attack Father 33  . Heart disease Father   . Heart disease Brother     Social History   Tobacco Use  . Smoking status: Never Smoker  . Smokeless tobacco: Never Used  Substance Use Topics  . Alcohol use: No  . Drug use: No    Home Medications Prior to Admission medications   Medication Sig Start Date End Date Taking? Authorizing Provider  amLODipine (NORVASC) 10 MG tablet TAKE 1 TABLET BY MOUTH EVERY DAY 12/29/19   Martinique, Peter M, MD  aspirin 325 MG tablet Take 325 mg by mouth daily.      [provider]  atorvastatin (LIPITOR) 10 MG tablet TAKE 1 TABLET BY MOUTH EVERY DAY 01/14/20   Lelon Perla, MD  docusate sodium (COLACE) 50 MG capsule Take 100 mg by mouth 2 (two) times daily. Reported on 11/29/2015    [provider]  furosemide (LASIX) 20 MG tablet Take 2 tablets (40 mg total) by mouth daily. 07/19/19   Lelon Perla, MD  ibuprofen (ADVIL) 800 MG tablet Take 0.5 tablets (400 mg total) by mouth daily. 01/11/20   Nafziger, [name], NP  metoprolol tartrate (LOPRESSOR) 50 MG tablet TAKE 1 TABLET BY MOUTH TWICE A DAY 05/18/20   Lelon Perla, MD  Multiple Vitamin (MULTIVITAMIN) tablet Take 1 tablet by mouth daily.      [provider]  neomycin-polymyxin b-dexamethasone (MAXITROL) 3.5-10000-0.1 SUSP Place 1 drop into the left eye 4 (four) times daily. 03/01/19   [provider]  fexofenadine (ALLEGRA) 180 MG tablet Take 1 tablet (180 mg total) by mouth daily. 04/29/11 11/13/11  McGowenAdrian Blackwater, MD    Allergies    Clindamycin palmitate hcl, Clindamycin/lincomycin, Penicillins, Sulfa antibiotics, Other, and Sulfonamide derivatives  Review of Systems   Review of Systems  Constitutional:  Positive for unexpected weight change.  HENT:       Per HPI, otherwise negative  Respiratory:       Per HPI, otherwise negative  Cardiovascular:       Per HPI, otherwise negative  Gastrointestinal: Negative for vomiting.       Anorexia  Endocrine:       Negative aside from HPI  Genitourinary:       Neg aside from HPI   Musculoskeletal:       Per HPI, otherwise negative  Skin: Negative.   Neurological: Positive for weakness. Negative for syncope.    Physical Exam Updated Vital Signs BP 126/69   Pulse 75   Temp 98.6 F (37 C) (Oral)   Resp  16   Ht 5\' 11"  (1.803 m)   Wt 77.1 kg   SpO2 90%   BMI 23.71 kg/m   Physical Exam Vitals and nursing note reviewed.  Constitutional:      Appearance: He is well-developed.     Comments: Unwell appearing thin adult male awake and alert  HENT:     Head: Normocephalic and atraumatic.  Eyes:     Conjunctiva/sclera: Conjunctivae normal.  Cardiovascular:     Rate and Rhythm: Normal rate and regular rhythm.  Pulmonary:     Effort: Pulmonary effort is normal. No respiratory distress.     Breath sounds: No stridor.     Comments: Diminished breath sounds left greater than right Abdominal:     General: There is no distension.  Musculoskeletal:       Arms:     Cervical back: Normal range of motion and neck supple. No edema, rigidity or crepitus. No spinous process tenderness or muscular tenderness.  Skin:    General: Skin is warm and dry.  Neurological:     Mental Status: He is alert.     Motor: Atrophy present. No weakness.  Psychiatric:     Comments: Awake and alert, pleasantly interactive.     ED Results / Procedures / Treatments   Labs (all labs ordered are listed, but only abnormal results are displayed) Labs Reviewed  COMPREHENSIVE METABOLIC PANEL - Abnormal; Notable for the following components:      Result Value   Glucose, Bld 108 (*)    Calcium 8.7 (*)    Albumin 2.7 (*)    Alkaline Phosphatase 194 (*)    All  other components within normal limits  BRAIN NATRIURETIC PEPTIDE - Abnormal; Notable for the following components:   B Natriuretic Peptide 226.3 (*)    All other components within normal limits  RESP PANEL BY RT PCR (RSV, FLU A&B, COVID)  CBC WITH DIFFERENTIAL/PLATELET     Radiology DG Chest 2 View  Result Date: 06/30/2020 CLINICAL DATA:  Back pain after fall. EXAM: CHEST - 2 VIEW COMPARISON:  April 14, 2019. FINDINGS: Stable cardiomediastinal silhouette. Status post coronary bypass graft. Stable moderate left pleural effusion is noted with probable underlying atelectasis. Possible small right apical pneumothorax is noted. Bony thorax is unremarkable. IMPRESSION: Possible small right apical pneumothorax is noted; CT scan of the chest is recommended for further evaluation. Stable moderate left pleural effusion with probable underlying atelectasis. Electronically Signed   By: Marijo Conception M.D.   On: 06/30/2020 14:35   DG Thoracic Spine 2 View  Result Date: 06/30/2020 CLINICAL DATA:  Back pain after fall yesterday. EXAM: THORACIC SPINE 2 VIEWS COMPARISON:  None. FINDINGS: No fracture or spondylolisthesis is noted. Anterior osteophyte formation is noted at multiple levels in the lower thoracic spine. IMPRESSION: Mild multilevel degenerative changes. No acute abnormality seen in the thoracic spine. Electronically Signed   By: Marijo Conception M.D.   On: 06/30/2020 14:32   DG Lumbar Spine Complete  Result Date: 06/30/2020 CLINICAL DATA:  Low back pain after fall yesterday. EXAM: LUMBAR SPINE - COMPLETE 4+ VIEW COMPARISON:  None. FINDINGS: No fracture or significant spondylolisthesis is noted. Moderate degenerative disc disease is noted at L3-4. Mild degenerative disc disease is noted at L2-3 and L4-5. IMPRESSION: Multilevel degenerative disc disease. No acute abnormality is noted. Aortic Atherosclerosis (ICD10-I70.0). Electronically Signed   By: Marijo Conception M.D.   On: 06/30/2020 14:31     Procedures Procedures (including critical care time)  Medications Ordered in ED Medications  fentaNYL (SUBLIMAZE) injection 12.5 mcg (12.5 mcg Intramuscular Given 06/30/20 1451)    ED Course  I have reviewed the triage vital signs and the nursing notes.  Pertinent labs & imaging results that were available during my care of the patient were reviewed by me and considered in my medical decision making (see chart for details).   On repeat exam patient is now accompanied by his wife.  We discussed findings thus far, need for CT scan given the questionable apical pneumothorax on x-ray. Notation was also made of the patient's left-sided pleural effusion, this grossly unchanged since his most recent x-ray. Otherwise x-rays do not demonstrate acute fracture, no obvious opacification consistent with pneumonia. Patient remains hemodynamically unremarkable. Patient is awaiting CT scan, may require admission should he be found to have pneumothorax, otherwise may be appropriate for discharge to a skilled nursing facility given his new decreased capacity to perform ADL, ongoing pain management. Patient, wife, daughter all aware of plan.  Dr. Ralene Bathe is aware of the patient.  MDM Rules/Calculators/A&P  Final Clinical Impression(s) / ED Diagnoses Final diagnoses:  Fall, initial encounter     Carmin Muskrat, MD 06/30/20 [phone]

## 2020-06-30 NOTE — ED Notes (Signed)
Pt returned from xray and is in room with family at bedside

## 2020-06-30 NOTE — ED Notes (Signed)
Room 27 cleaned and pt transferred to room

## 2020-06-30 NOTE — Progress Notes (Signed)
Manufacturing engineer [facility]) hospital liaison note   Mr. Guillette is a hospice pt at home with [facility], on service with a terminal diagnosis of HTN heart disease.  Mr. Sena has been experiencing progressive, generalized weakness with increased falls.  Family reports hoping for optimizing medically and exploring rehab or SNF placement.    Hospital liaisons will be following throughout hospitalization.  Please do not hesitate to call [phone] or reach out to the liaison listed on Amion (under hospice).  Thank you for the opportunity to participate in this patient's care.    Domenic Moras, BSN, RN [facility] Liaison [phone] (24 h on call)

## 2020-06-30 NOTE — ED Provider Notes (Signed)
Patient care assumed at 1500  patient here from home on home hospice for advanced CHF here for evaluation following a fall yesterday, unable to ambulate today. Imaging with progressive pleural effusion's. Chest x-ray with possible pneumothorax and a CT scan was obtained to further evaluate this. Patient care assumed pending CT scan.  CT scan is negative for pneumothorax but does demonstrate progressive fusions as well as new onset ascites and abdominal wall edema. He does have edema on examination, feel that this may be contributing to his fall. Given his inability to ambulate, worsening heart failure plan to admit for diuresis and reassessment. Patient and daughter are in agreement with treatment plan.   Quintella Reichert, MD 06/30/20 2251

## 2020-06-30 NOTE — ED Notes (Signed)
Patient transported to CT 

## 2020-06-30 NOTE — ED Notes (Signed)
CBC Redraw sent down to lab

## 2020-06-30 NOTE — H&P (Signed)
History and Physical    Bernard Byrd EYC:[id number] DOB: 12-02-1930 DOA: 06/30/2020  PCP: Dorothyann Peng, NP  Patient coming from: Home via EMS  I have personally briefly reviewed patient's old medical records in [place]  Chief Complaint: Fall at home  HPI: Bernard Byrd is a 86 y.o. male with medical history significant for chronic diastolic CHF, CAD s/p CABG, history of CVA, HTN, HLD, and currently active with home hospice who presents to the ED for evaluation after a fall at home.  Daughter is at bedside who provides additional history.  Patient states yesterday he was standing up reaching for his walker when he lost balance and fell backwards.  He hit his back on the nightstand and also bumped the back of his head.  Since then he has had difficulty ambulating on his own power or with assistive devices.  He says he did not lose consciousness after the fall.  He denies any chest pain or palpitations.  He reports chronic shortness of breath and dyspnea with minimal exertion.  He denies any orthopnea.  He has not had any nausea, vomiting, abdominal pain, dysuria.  He says he has chronic swelling in both of his legs, left greater than right which is increased compared to his baseline.  Per daughter, patient was diagnosed with CHF over a year ago.  At that time patient decided he wanted to limit further medical care including further office visits, blood work, or aggressive procedures.  Daughter states that he has been slowly declining over the last 6 months but more significantly over the last month.  She says he is eating less and losing weight.  She is concerned that he may be choking on food at times when he eats.  ED Course:  Initial vitals showed BP 118/71, pulse 61, RR 16, temp 98.6 Fahrenheit, SPO2 96% on room air.  Labs show sodium 137, potassium 3.7, bicarb 23, BUN 15, creatinine 1.17, serum glucose 108, AST 31, ALT 22, alk phos 194, total bilirubin 1.1, WBC 8.3,  hemoglobin 13.5, platelets 167,000, BNP 226.3.  SARS-CoV-2 PCR is negative.  Influenza A/B PCR's are negative.  2 view chest x-ray showed moderate left pleural effusion.  Question of small right apical pneumothorax was seen.  Follow-up CT chest without contrast was negative for pneumothorax.  Large left and small right pleural effusions were seen.  Extensive upper abdominal ascites and edema in the upper abdominal mesentery and omentum are seen as well as cirrhosis.  Right femur x-ray was negative for fracture or dislocation.  Pelvic x-ray showed prior bilateral hip replacements without acute osseous abnormality.  Patient was given IV Lasix 40 mg and fentanyl/Norco for pain.  The hospitalist service was consulted to admit for further evaluation and management.  Review of Systems: All systems reviewed and are negative except as documented in history of present illness above.   Past Medical History:  Diagnosis Date  . CAD (coronary artery disease)    CABG 2008:  myoview  2010 showed no ischemia and a normal EF.  Nuclear study 11/2011: EF 55%. There was a prior inferior basal infarct but no ischemia.  07/2015 no change on repeat nuclear study.  . CVA (cerebral vascular accident) ([place])    possible/no residual deficit--following CABG   . Diverticulosis of rectosigmoid    Noted on CT 04/2011 done to eval chronic groin pain  . DVT (deep venous thrombosis) ([place]) 2009   Post-op from laparoscopic inguinal hernia repair  . Groin pain,  left lower quadrant    Chronic, intermittent: believed to be due to scarring around MESH placed at inguinal hernia repair  . Hyperlipidemia   . Hypertension   . Melanoma (HCC)    skin graft surgery required  . MELANOMA, HX OF 12/02/2008  . MI (myocardial infarction) (HCC) 10/14/2006   Inferior.    . Nephrolithiasis   . Nuclear sclerotic cataract of both eyes 2015  . Osteoarthritis    Primarily hips  . Prostate nodule    multiple benign biopsies (Dr Earlene Plater)  .  Retroperitoneal hematoma 2009   on lovenox; greenfield filter placed after this    Past Surgical History:  Procedure Laterality Date  . BILATERAL ANTERIOR TOTAL HIP ARTHROPLASTY     One in 2004 and one in 2011  . brain hemorrhage     at age 66  . CARDIOVASCULAR STRESS TEST  11/2011; 07/2015   2013 Nuclear study: EF 55%. There was a prior inferior basal infarct but no ischemia.  No change 2016.  . carpal tunnel repair B Bilateral   . CATARACT EXTRACTION    . COLONOSCOPY W/ POLYPECTOMY  02/07/09   Adenomatous polyps, severe diverticulosis, internal hemorrhoids.  Repeat 2015---however, pt declines any further colon cancer screening  . greenfield filter    . HERNIA REPAIR  05/20/2008   open, X 3  . kidney stone removal  01/18/1988   x 5   . MELANOMA EXCISION  08/1990   left shoulder - skin graft from leg  . ROTATOR CUFF REPAIR Bilateral   . status post coronary bypass graft  10/14/06    (LIMA to the LAD, saphenous vein graft to the diagonal, saphenous vein graft to the mid obtuse marginal, saphenous vein graft placed in a sequential fashion ot the RV branch of the right coronary artery and distal right coronary artery  . trigger finger X 3      Social History:  reports that he has never smoked. He has never used smokeless tobacco. He reports that he does not drink alcohol and does not use drugs.  Allergies  Allergen Reactions  . Clindamycin Palmitate Hcl Itching    Difficulty breathing  . Clindamycin/Lincomycin Itching and Hives    Difficulty breathing  . Penicillins Hives and Rash  . Sulfa Antibiotics Hives  . Other Hives and Rash  . Sulfonamide Derivatives Hives and Rash    Family History  Problem Relation Age of Onset  . Cancer Mother 74       uterine and ovarian   . Heart attack Father 47  . Heart disease Father   . Heart disease Brother      Prior to Admission medications   Medication Sig Start Date End Date Taking? Authorizing Provider  amLODipine (NORVASC) 10 MG  tablet TAKE 1 TABLET BY MOUTH EVERY DAY Patient taking differently: Take 10 mg by mouth daily.  12/29/19  Yes [place], Peter M, MD  aspirin 81 MG EC tablet Take 81 mg by mouth daily.    Yes [provider]  docusate sodium (COLACE) 50 MG capsule Take 100 mg by mouth 2 (two) times daily. Reported on 11/29/2015   Yes [provider]  furosemide (LASIX) 20 MG tablet Take 2 tablets (40 mg total) by mouth daily. 07/19/19  Yes Lewayne Bunting, MD  metoprolol tartrate (LOPRESSOR) 50 MG tablet TAKE 1 TABLET BY MOUTH TWICE A DAY Patient taking differently: Take 50 mg by mouth 2 (two) times daily.  05/18/20  Yes Lewayne Bunting,  MD  Propylene Glycol (SYSTANE BALANCE) 0.6 % SOLN Apply 1 drop to eye daily as needed (dry eyes).   Yes [provider]  atorvastatin (LIPITOR) 10 MG tablet TAKE 1 TABLET BY MOUTH EVERY DAY Patient not taking: Reported on 06/30/2020 01/14/20   Lelon Perla, MD  ibuprofen (ADVIL) 800 MG tablet Take 0.5 tablets (400 mg total) by mouth daily. Patient not taking: Reported on 06/30/2020 01/11/20   Dorothyann Peng, NP  fexofenadine (ALLEGRA) 180 MG tablet Take 1 tablet (180 mg total) by mouth daily. 04/29/11 11/13/11  Tammi Sou, MD    Physical Exam: Vitals:   06/30/20 1815 06/30/20 2036 06/30/20 2251 06/30/20 2300  BP: 111/69 111/69 127/72   Pulse: 79 82 83   Resp: $Remo'16 15 18   'eeFgj$ Temp:   98.3 F (36.8 C)   TempSrc:   Oral   SpO2: 90% 95% 91%   Weight:    76.9 kg  Height:       Constitutional: Elderly man resting supine in bed, NAD, calm, comfortable Eyes: PERRL, lids and conjunctivae normal ENMT: Mucous membranes are moist. Posterior pharynx clear of any exudate or lesions.Normal dentition.  Neck: normal, supple, no masses. Respiratory: Diminished breath sounds most left lung field.  Normal respiratory effort. No accessory muscle use.  Cardiovascular: Regular rate and rhythm, no murmurs / rubs / gallops.  3+ left lower extremity pitting edema,  1+ right lower extremity pitting edema.  Abdomen: no tenderness, no masses palpated. Bowel sounds positive.  Musculoskeletal: no clubbing / cyanosis. No joint deformity upper and lower extremities. Good ROM, no contractures. Normal muscle tone.  Skin: no rashes, lesions, ulcers. No induration Neurologic: CN 2-12 grossly intact. Sensation intact, Strength 5/5 in all 4.  Psychiatric: Normal judgment and insight. Alert and oriented x 3. Normal mood.   Labs on Admission: I have personally reviewed following labs and imaging studies  CBC: Recent Labs  Lab 06/30/20 1714  WBC 8.3  NEUTROABS 5.1  HGB 13.5  HCT 41.3  MCV 104.0*  PLT 253   Basic Metabolic Panel: Recent Labs  Lab 06/30/20 1439  NA 137  K 3.7  CL 103  CO2 23  GLUCOSE 108*  BUN 15  CREATININE 1.17  CALCIUM 8.7*   GFR: Estimated Creatinine Clearance: 47.4 mL/min (by C-G formula based on SCr of 1.17 mg/dL). Liver Function Tests: Recent Labs  Lab 06/30/20 1439  AST 31  ALT 22  ALKPHOS 194*  BILITOT 1.1  PROT 7.2  ALBUMIN 2.7*   No results for input(s): LIPASE, AMYLASE in the last 168 hours. No results for input(s): AMMONIA in the last 168 hours. Coagulation Profile: No results for input(s): INR, PROTIME in the last 168 hours. Cardiac Enzymes: No results for input(s): CKTOTAL, CKMB, CKMBINDEX, TROPONINI in the last 168 hours. BNP (last 3 results) No results for input(s): PROBNP in the last 8760 hours. HbA1C: No results for input(s): HGBA1C in the last 72 hours. CBG: No results for input(s): GLUCAP in the last 168 hours. Lipid Profile: No results for input(s): CHOL, HDL, LDLCALC, TRIG, CHOLHDL, LDLDIRECT in the last 72 hours. Thyroid Function Tests: No results for input(s): TSH, T4TOTAL, FREET4, T3FREE, THYROIDAB in the last 72 hours. Anemia Panel: No results for input(s): VITAMINB12, FOLATE, FERRITIN, TIBC, IRON, RETICCTPCT in the last 72 hours. Urine analysis:    Component Value Date/Time    COLORURINE YELLOW 08/06/2014 [place] 08/06/2014 2345   LABSPEC 1.023 08/06/2014 2345   PHURINE 5.5 08/06/2014  [place] 08/06/2014 [place] 08/06/2014 2345   BILIRUBINUR NEGATIVE 08/06/2014 2345   KETONESUR 40 (A) 08/06/2014 2345   PROTEINUR NEGATIVE 08/06/2014 2345   UROBILINOGEN 0.2 08/06/2014 2345   NITRITE NEGATIVE 08/06/2014 2345   LEUKOCYTESUR NEGATIVE 08/06/2014 2345    Radiological Exams on Admission: DG Chest 2 View  Result Date: 06/30/2020 CLINICAL DATA:  Back pain after fall. EXAM: CHEST - 2 VIEW COMPARISON:  April 14, 2019. FINDINGS: Stable cardiomediastinal silhouette. Status post coronary bypass graft. Stable moderate left pleural effusion is noted with probable underlying atelectasis. Possible small right apical pneumothorax is noted. Bony thorax is unremarkable. IMPRESSION: Possible small right apical pneumothorax is noted; CT scan of the chest is recommended for further evaluation. Stable moderate left pleural effusion with probable underlying atelectasis. Electronically Signed   By: Marijo Conception M.D.   On: 06/30/2020 14:35   DG Thoracic Spine 2 View  Result Date: 06/30/2020 CLINICAL DATA:  Back pain after fall yesterday. EXAM: THORACIC SPINE 2 VIEWS COMPARISON:  None. FINDINGS: No fracture or spondylolisthesis is noted. Anterior osteophyte formation is noted at multiple levels in the lower thoracic spine. IMPRESSION: Mild multilevel degenerative changes. No acute abnormality seen in the thoracic spine. Electronically Signed   By: Marijo Conception M.D.   On: 06/30/2020 14:32   DG Lumbar Spine Complete  Result Date: 06/30/2020 CLINICAL DATA:  Low back pain after fall yesterday. EXAM: LUMBAR SPINE - COMPLETE 4+ VIEW COMPARISON:  None. FINDINGS: No fracture or significant spondylolisthesis is noted. Moderate degenerative disc disease is noted at L3-4. Mild degenerative disc disease is noted at L2-3 and L4-5. IMPRESSION:  Multilevel degenerative disc disease. No acute abnormality is noted. Aortic Atherosclerosis (ICD10-I70.0). Electronically Signed   By: Marijo Conception M.D.   On: 06/30/2020 14:31   DG Pelvis 1-2 Views  Result Date: 06/30/2020 CLINICAL DATA:  Fall with right leg pain EXAM: PELVIS - 1-2 VIEW COMPARISON:  12/16/2016 FINDINGS: SI joints are non widened. Status post bilateral hip replacements with normal alignment. No definitive fracture is seen. Pubic symphysis is intact. There are extensive vascular calcifications. IMPRESSION: Status post bilateral hip replacements without acute osseous abnormality. Electronically Signed   By: Donavan Foil M.D.   On: 06/30/2020 21:34   CT Chest Wo Contrast  Result Date: 06/30/2020 CLINICAL DATA:  Back pain, fall. EXAM: CT CHEST WITHOUT CONTRAST TECHNIQUE: Multidetector CT imaging of the chest was performed following the standard protocol without IV contrast. COMPARISON:  Chest radiograph 07/01/2019 and chest CT from 12/07/2008 FINDINGS: Cardiovascular: Coronary, aortic arch, and branch vessel atherosclerotic vascular disease. Prior CABG. Mediastinum/Nodes: No pathologic adenopathy identified. Anterior to one of the bypass grafts, there is a small fluid density in the mediastinum measuring 2.3 by 0.9 cm on image 71 of series 3 which was not present on 12/07/2008, significance uncertain. This could be a low-density lymph node. Lungs/Pleura: Large left and small right pleural effusions with associated passive atelectasis. The left pleural effusion occupies 75% of left hemithoracic volume. Biapical pleuroparenchymal scarring in the lungs. There is no pneumothorax. The appearance on recent chest radiography is due to skin folds. Upper Abdomen: Cirrhosis. Extensive upper abdominal ascites. Cystic lesions with faint marginal calcifications in the dome of the right and left hepatic lobe. Extensive edema in the upper abdominal mesentery and omentum. Aortoiliac atherosclerotic  vascular disease. Prominently diminished right renal size compatible with atrophy. Hypodense lesion along the left renal pelvis, probably a peripelvic cyst. Other hypodense  lesions of the left kidney are probably cysts but technically nonspecific. Musculoskeletal: Degenerative glenohumeral arthropathy bilaterally. Prominent degenerative sternoclavicular arthropathy bilaterally. Lower cervical spondylosis and thoracic spondylosis. No well-defined fracture. IMPRESSION: 1. No well-defined fracture is identified.  No pneumothorax. 2. Large left and small right pleural effusions with associated passive atelectasis. 3. Extensive upper abdominal ascites. Extensive edema in the upper abdominal mesentery and omentum. 4. Cirrhosis. 5. Coronary, aortic arch, and branch vessel atherosclerotic vascular disease. Prior CABG. 6. Prominently diminished right renal size compatible with atrophy. 7. Hypodense lesions of the left kidney are probably cysts but technically nonspecific. 8. Small fluid density in the mediastinum anterior to one of the bypass grafts, significance uncertain. Possibly a small low-density lymph node. 9. Degenerative glenohumeral arthropathy bilaterally. Prominent degenerative sternoclavicular arthropathy bilaterally. 10. Aortic atherosclerosis. Aortic Atherosclerosis (ICD10-I70.0). Electronically Signed   By: Van Clines M.D.   On: 06/30/2020 18:57   DG Femur Min 2 Views Right  Result Date: 06/30/2020 CLINICAL DATA:  Fall, right leg pain, EXAM: RIGHT FEMUR 2 VIEWS COMPARISON:  None. FINDINGS: Two view radiograph right femur demonstrates surgical changes of right total hip arthroplasty. Arthroplasty components are in anatomic alignment. No fracture or dislocation. No lytic or blastic bone lesion. Advanced vascular calcifications are seen within the medial soft tissues. IMPRESSION: No acute fracture or dislocation. Electronically Signed   By: Fidela Salisbury MD   On: 06/30/2020 21:44    EKG: Not  performed.  Assessment/Plan Principal Problem:   Acute on chronic diastolic CHF (congestive heart failure) (HCC) Active Problems:   Hyperlipidemia   HTN (hypertension), benign   CAD (coronary artery disease)   Pleural effusion due to CHF (congestive heart failure) ([place])   Fall at home, initial encounter  Bernard Byrd is a 86 y.o. male with medical history significant for chronic diastolic CHF, CAD s/p CABG, history of CVA, HTN, HLD, and currently active with home hospice who is admitted with acute on chronic diastolic CHF with large left pleural effusion.  Acute on chronic diastolic CHF with large left pleural effusion: EF 60-65% by TTE 02/22/2019.  Patient states he has made position to avoid aggressive management of his CHF and is currently under hospice care.  He was offered thoracentesis in the past which he declined.  He has a large left pleural effusion on imaging and increased peripheral edema but not having significant respiratory symptoms at this time.  He is agreeable for IV diuresis with monitoring of blood work while in the hospital. -Continue IV Lasix 40 mg daily -Monitor strict I/O's and daily weights -Patient currently declines therapeutic thoracentesis, he will consider overnight  Fall at home/deconditioning/failure to thrive: Progressive deconditioning at home over the last 6 months, more significant over the last 1 month.  Had mechanical fall at home without significant injury on pelvic, right femur, and thoracic/lumbar spine x-rays although is having continued back pain. -PT/OT eval -Pain control with Tylenol and Norco only as needed with hold parameters -Anticipate DC to home with continued home hospice versus SNF  Reported dysphagia at home: Decreased oral intake at home with reported episodes of choking per daughter.  No evidence of aspiration pneumonia on imaging. -Request SLP eval  CAD s/p CABG: Denies any chest pain.  Continue aspirin and Lopressor.  Not on  statin per patient preference.  Hypertension: Continue amlodipine and Lopressor.  Hyperlipidemia: Not on statin per patient preference.  DVT prophylaxis: Lovenox Code Status: DNR, confirmed with patient Family Communication: Discussed with patient's daughter at bedside Disposition  Plan: From home, dispo pending PT/OT eval Consults called: None Admission status:  Status is: Observation  The patient remains OBS appropriate and will d/c before 2 midnights.  Dispo: The patient is from: Home              Anticipated d/c is to: Home versus SNF              Anticipated d/c date is: 1 day              Patient currently is not medically stable to d/c.   Zada Finders MD [facility]  If 7PM-7AM, please contact night-coverage [email or web address]  07/01/2020, 1:32 AM

## 2020-06-30 NOTE — ED Notes (Signed)
Patient transported to X-ray 

## 2020-06-30 NOTE — Care Management (Signed)
ED CM received call from [place] concerning care planning, patient is active with [facility] services, patient has recently been experiencing increasing weakness per record, as of this time patient is not meeting criteria for hospitalization, complete ED evaluation still in pending.  Discussed with Dr. Ralene Bathe EDP,  ED [facility] team will continue to follow for transitional care planning.

## 2020-07-01 DIAGNOSIS — I130 Hypertensive heart and chronic kidney disease with heart failure and stage 1 through stage 4 chronic kidney disease, or unspecified chronic kidney disease: Secondary | ICD-10-CM | POA: Diagnosis present

## 2020-07-01 DIAGNOSIS — Z96643 Presence of artificial hip joint, bilateral: Secondary | ICD-10-CM | POA: Diagnosis present

## 2020-07-01 DIAGNOSIS — R531 Weakness: Secondary | ICD-10-CM

## 2020-07-01 DIAGNOSIS — M545 Low back pain, unspecified: Secondary | ICD-10-CM | POA: Diagnosis not present

## 2020-07-01 DIAGNOSIS — Z743 Need for continuous supervision: Secondary | ICD-10-CM | POA: Diagnosis not present

## 2020-07-01 DIAGNOSIS — L89152 Pressure ulcer of sacral region, stage 2: Secondary | ICD-10-CM | POA: Diagnosis present

## 2020-07-01 DIAGNOSIS — Z881 Allergy status to other antibiotic agents status: Secondary | ICD-10-CM | POA: Diagnosis not present

## 2020-07-01 DIAGNOSIS — I110 Hypertensive heart disease with heart failure: Secondary | ICD-10-CM | POA: Diagnosis not present

## 2020-07-01 DIAGNOSIS — R279 Unspecified lack of coordination: Secondary | ICD-10-CM | POA: Diagnosis not present

## 2020-07-01 DIAGNOSIS — I509 Heart failure, unspecified: Secondary | ICD-10-CM

## 2020-07-01 DIAGNOSIS — Z8673 Personal history of transient ischemic attack (TIA), and cerebral infarction without residual deficits: Secondary | ICD-10-CM | POA: Diagnosis not present

## 2020-07-01 DIAGNOSIS — Z85820 Personal history of malignant melanoma of skin: Secondary | ICD-10-CM | POA: Diagnosis not present

## 2020-07-01 DIAGNOSIS — I10 Essential (primary) hypertension: Secondary | ICD-10-CM

## 2020-07-01 DIAGNOSIS — R188 Other ascites: Secondary | ICD-10-CM | POA: Diagnosis not present

## 2020-07-01 DIAGNOSIS — I252 Old myocardial infarction: Secondary | ICD-10-CM | POA: Diagnosis not present

## 2020-07-01 DIAGNOSIS — Y92009 Unspecified place in unspecified non-institutional (private) residence as the place of occurrence of the external cause: Secondary | ICD-10-CM | POA: Diagnosis not present

## 2020-07-01 DIAGNOSIS — R627 Adult failure to thrive: Secondary | ICD-10-CM | POA: Diagnosis present

## 2020-07-01 DIAGNOSIS — J90 Pleural effusion, not elsewhere classified: Secondary | ICD-10-CM | POA: Diagnosis not present

## 2020-07-01 DIAGNOSIS — R634 Abnormal weight loss: Secondary | ICD-10-CM | POA: Diagnosis not present

## 2020-07-01 DIAGNOSIS — M549 Dorsalgia, unspecified: Secondary | ICD-10-CM | POA: Diagnosis present

## 2020-07-01 DIAGNOSIS — R441 Visual hallucinations: Secondary | ICD-10-CM | POA: Diagnosis not present

## 2020-07-01 DIAGNOSIS — Z951 Presence of aortocoronary bypass graft: Secondary | ICD-10-CM | POA: Diagnosis not present

## 2020-07-01 DIAGNOSIS — Z86718 Personal history of other venous thrombosis and embolism: Secondary | ICD-10-CM | POA: Diagnosis not present

## 2020-07-01 DIAGNOSIS — W19XXXA Unspecified fall, initial encounter: Secondary | ICD-10-CM | POA: Diagnosis not present

## 2020-07-01 DIAGNOSIS — K7469 Other cirrhosis of liver: Secondary | ICD-10-CM

## 2020-07-01 DIAGNOSIS — R630 Anorexia: Secondary | ICD-10-CM | POA: Diagnosis not present

## 2020-07-01 DIAGNOSIS — K7460 Unspecified cirrhosis of liver: Secondary | ICD-10-CM | POA: Diagnosis present

## 2020-07-01 DIAGNOSIS — M160 Bilateral primary osteoarthritis of hip: Secondary | ICD-10-CM | POA: Diagnosis present

## 2020-07-01 DIAGNOSIS — Z66 Do not resuscitate: Secondary | ICD-10-CM | POA: Diagnosis present

## 2020-07-01 DIAGNOSIS — E7800 Pure hypercholesterolemia, unspecified: Secondary | ICD-10-CM

## 2020-07-01 DIAGNOSIS — N182 Chronic kidney disease, stage 2 (mild): Secondary | ICD-10-CM | POA: Diagnosis present

## 2020-07-01 DIAGNOSIS — Z515 Encounter for palliative care: Secondary | ICD-10-CM | POA: Diagnosis not present

## 2020-07-01 DIAGNOSIS — Z8249 Family history of ischemic heart disease and other diseases of the circulatory system: Secondary | ICD-10-CM | POA: Diagnosis not present

## 2020-07-01 DIAGNOSIS — I5033 Acute on chronic diastolic (congestive) heart failure: Secondary | ICD-10-CM | POA: Diagnosis not present

## 2020-07-01 DIAGNOSIS — W010XXA Fall on same level from slipping, tripping and stumbling without subsequent striking against object, initial encounter: Secondary | ICD-10-CM | POA: Diagnosis present

## 2020-07-01 DIAGNOSIS — E785 Hyperlipidemia, unspecified: Secondary | ICD-10-CM | POA: Diagnosis present

## 2020-07-01 DIAGNOSIS — R410 Disorientation, unspecified: Secondary | ICD-10-CM | POA: Diagnosis not present

## 2020-07-01 DIAGNOSIS — Z20822 Contact with and (suspected) exposure to covid-19: Secondary | ICD-10-CM | POA: Diagnosis present

## 2020-07-01 DIAGNOSIS — I2510 Atherosclerotic heart disease of native coronary artery without angina pectoris: Secondary | ICD-10-CM | POA: Diagnosis present

## 2020-07-01 DIAGNOSIS — Z87442 Personal history of urinary calculi: Secondary | ICD-10-CM | POA: Diagnosis not present

## 2020-07-01 DIAGNOSIS — R443 Hallucinations, unspecified: Secondary | ICD-10-CM | POA: Diagnosis not present

## 2020-07-01 DIAGNOSIS — R1310 Dysphagia, unspecified: Secondary | ICD-10-CM | POA: Diagnosis present

## 2020-07-01 LAB — CBC
HCT: 35 % — ABNORMAL LOW (ref 39.0–52.0)
Hemoglobin: 11.5 g/dL — ABNORMAL LOW (ref 13.0–17.0)
MCH: 34.4 pg — ABNORMAL HIGH (ref 26.0–34.0)
MCHC: 32.9 g/dL (ref 30.0–36.0)
MCV: 104.8 fL — ABNORMAL HIGH (ref 80.0–100.0)
Platelets: 129 10*3/uL — ABNORMAL LOW (ref 150–400)
RBC: 3.34 MIL/uL — ABNORMAL LOW (ref 4.22–5.81)
RDW: 15.3 % (ref 11.5–15.5)
WBC: 4.8 10*3/uL (ref 4.0–10.5)
nRBC: 0 % (ref 0.0–0.2)

## 2020-07-01 LAB — BASIC METABOLIC PANEL
Anion gap: 8 (ref 5–15)
BUN: 14 mg/dL (ref 8–23)
CO2: 26 mmol/L (ref 22–32)
Calcium: 8.3 mg/dL — ABNORMAL LOW (ref 8.9–10.3)
Chloride: 103 mmol/L (ref 98–111)
Creatinine, Ser: 1.08 mg/dL (ref 0.61–1.24)
GFR, Estimated: >60 mL/min (ref >60)
Glucose, Bld: 81 mg/dL (ref 70–99)
Potassium: 3.6 mmol/L (ref 3.5–5.1)
Sodium: 137 mmol/L (ref 135–145)

## 2020-07-01 LAB — MAGNESIUM: Magnesium: 1.9 mg/dL (ref 1.7–2.4)

## 2020-07-01 NOTE — Evaluation (Signed)
Clinical/Bedside Swallow Evaluation Patient Details  Name: Bernard Byrd MRN: [id number] Date of Birth: 1930/10/29  Today's Date: 07/01/2020 Time: SLP Start Time (ACUTE ONLY): 57 SLP Stop Time (ACUTE ONLY): 1248 SLP Time Calculation (min) (ACUTE ONLY): 18 min  Past Medical History:  Past Medical History:  Diagnosis Date  . CAD (coronary artery disease)    CABG 2008:  myoview  2010 showed no ischemia and a normal EF.  Nuclear study 11/2011: EF 55%. There was a prior inferior basal infarct but no ischemia.  07/2015 no change on repeat nuclear study.  . CVA (cerebral vascular accident) ([place])    possible/no residual deficit--following CABG   . Diverticulosis of rectosigmoid    Noted on CT 04/2011 done to eval chronic groin pain  . DVT (deep venous thrombosis) ([place]) 2009   Post-op from laparoscopic inguinal hernia repair  . Groin pain, left lower quadrant    Chronic, intermittent: believed to be due to scarring around MESH placed at inguinal hernia repair  . Hyperlipidemia   . Hypertension   . Melanoma ([place])    skin graft surgery required  . MELANOMA, HX OF 12/02/2008  . MI (myocardial infarction) ([place]) 10/14/2006   Inferior.    . Nephrolithiasis   . Nuclear sclerotic cataract of both eyes 2015  . Osteoarthritis    Primarily hips  . Prostate nodule    multiple benign biopsies (Dr Rosana Hoes)  . Retroperitoneal hematoma 2009   on lovenox; greenfield filter placed after this   Past Surgical History:  Past Surgical History:  Procedure Laterality Date  . BILATERAL ANTERIOR TOTAL HIP ARTHROPLASTY     One in 2004 and one in 2011  . brain hemorrhage     at age 31  . CARDIOVASCULAR STRESS TEST  11/2011; 07/2015   2013 Nuclear study: EF 55%. There was a prior inferior basal infarct but no ischemia.  No change 2016.  . carpal tunnel repair B Bilateral   . CATARACT EXTRACTION    . COLONOSCOPY W/ POLYPECTOMY  02/07/09   Adenomatous polyps, severe diverticulosis, internal hemorrhoids.  Repeat  2015---however, pt declines any further colon cancer screening  . greenfield filter    . HERNIA REPAIR  05/20/2008   open, X 3  . kidney stone removal  01/18/1988   x 5   . MELANOMA EXCISION  08/1990   left shoulder - skin graft from leg  . ROTATOR CUFF REPAIR Bilateral   . status post coronary bypass graft  10/14/06    (LIMA to the LAD, saphenous vein graft to the diagonal, saphenous vein graft to the mid obtuse marginal, saphenous vein graft placed in a sequential fashion ot the RV branch of the right coronary artery and distal right coronary artery  . trigger finger X 3     HPI:  86 y.o male presenting s/p fall at home. Imaging neg for acute abnormality. PMH includes chronic diastolic CHF, CAD s/p CABG, history of CVA, HTN, HLD, and currently active with home hospice.    Assessment / Plan / Recommendation Clinical Impression  Pt was seen for a bedside swallow evaluation and he presents with suspected mild oropharyngeal dysphagia.  Pt reported that he has had a few choking episodes in the past with solids, and that he occasionally coughs with coffee.  He denied coughing/choking with other liquids.  Pt reported that he uses compensatory strategies during PO consumption including sitting upright, taking small bites/sips, not talking during intake, and masticating his food fully before swallow initiation.  Pt consumed thin liquid and regular solids from his lunch meal tray during this evaluation.  His wife cut his meat into small pieces prior to him consuming it.  He was noted to have appropriate mastication of regular solids with delayed throat clearing noted intermittently.  No overt s/sx of aspiration were observed with thin liquids.  Recommend diet change to Dysphagia 3 (soft) solids and thin liquids with continued use of compensatory strategies, and with medications administered whole with liquid or in puree (per pt preference).  Given excellent independent use of swallow strategies, pt may  upgrade back to regular solids if he does not enjoy the Dysphagia 3 diet.  Spoke with pt, wife, and RN regarding all recommendations and they verbalized understanding.    SLP Visit Diagnosis: Dysphagia, unspecified (R13.10)    Aspiration Risk  Mild aspiration risk    Diet Recommendation Dysphagia 3 (Mech soft);Thin liquid   Liquid Administration via: Cup;Straw Medication Administration: Whole meds with liquid Supervision: Patient able to self feed;Intermittent supervision to cue for compensatory strategies Compensations: Slow rate;Small sips/bites Postural Changes: Seated upright at 90 degrees    Other  Recommendations Oral Care Recommendations: Oral care BID   Follow up Recommendations Other (comment) (TBD)      Frequency and Duration min 2x/week  2 weeks       Prognosis Prognosis for Safe Diet Advancement: Fair Barriers to Reach Goals:  (Prior level of function )      Swallow Study   General HPI: 86 y.o male presenting s/p fall at home. Imaging neg for acute abnormality. PMH includes chronic diastolic CHF, CAD s/p CABG, history of CVA, HTN, HLD, and currently active with home hospice.  Type of Study: Bedside Swallow Evaluation Previous Swallow Assessment: none  Diet Prior to this Study: Regular;Thin liquids Temperature Spikes Noted: No Respiratory Status: Room air History of Recent Intubation: No Behavior/Cognition: Alert;Cooperative;Pleasant mood Oral Cavity Assessment: Within Functional Limits Oral Care Completed by SLP: No Oral Cavity - Dentition: Other (Comment) (partial dentures top and bottom ) Vision: Functional for self-feeding Self-Feeding Abilities: Able to feed self;Needs set up Patient Positioning: Upright in bed Baseline Vocal Quality: Normal Volitional Swallow: Able to elicit    Oral/Motor/Sensory Function Overall Oral Motor/Sensory Function: Within functional limits   Ice Chips Ice chips: Not tested   Thin Liquid Thin Liquid: Within functional  limits Presentation: Straw    Nectar Thick Nectar Thick Liquid: Not tested   Honey Thick Honey Thick Liquid: Not tested   Puree Puree: Not tested   Solid     Solid: Impaired Presentation: Self Fed Pharyngeal Phase Impairments: Throat Clearing - Delayed     Colin Mulders M.S., CCC-SLP Acute Rehabilitation Services Office: [phone]  Elvia Collum Estefano Victory 07/01/2020,1:00 PM

## 2020-07-01 NOTE — Evaluation (Signed)
SLP Cancellation Note  Patient Details Name: KREGG CIHLAR MRN: [id number] DOB: Apr 05, 1931   Cancelled treatment:       Reason Eval/Treat Not Completed: Other (comment) (pt with MD when SLP attempted to eval, will continue efforts)  Kathleen Lime, MS [facility] SLP Acute Rehab Services Office [phone] Pager [phone]   Macario Golds 07/01/2020, 10:23 AM

## 2020-07-01 NOTE — Evaluation (Signed)
Occupational Therapy Evaluation Patient Details Name: Bernard Byrd MRN: [id number] DOB: 1931-05-07 Today's Date: 07/01/2020    History of Present Illness 86 y.o male presenting s/p fall at home. Imaging neg for acute abnormality. PMH includes chronic diastolic CHF, CAD s/p CABG, history of CVA, HTN, HLD, and currently active with home hospice   Clinical Impression   PTA pt living with spouse, requiring assist for ADL and IADL management. Pt is active with hospice at this time. Wife assist with bathing and dressing, pt normally uses a RW for mobility. At time of eval, pt able to complete bed mobility with mod A. He had just finished working with pt, and was very anxious with anticipation about pain. Pt then completed rolling in bed with max A for barrier cream placement. Educated pt and wife on skin integrity techniques to reduce further areas of pressure. Also discussed various ADL techniques for spouse and pt to reduce caregiver burden. Recommend pt follow up with HHOT to continue to progress ADLs in familiar home environment. Will continue to follow per POC listed below.    Follow Up Recommendations  Home health OT;Supervision/Assistance - 24 hour (+aide)    Equipment Recommendations  None recommended by OT    Recommendations for Other Services       Precautions / Restrictions Precautions Precautions: Fall Restrictions Weight Bearing Restrictions: No      Mobility Bed Mobility Overal bed mobility: Needs Assistance Bed Mobility: Rolling Rolling: Max assist;Mod assist         General bed mobility comments: max A to roll for peri care (has 2 small open areas in sacral area, RN aware). Mod/max A to sit EOB briefly, before requesting to immediately lay back down. Pt anxious with anticipation of pain    Transfers                      Balance Overall balance assessment: Needs assistance Sitting-balance support: Bilateral upper extremity supported;Feet  unsupported Sitting balance-Leahy Scale: Fair                                     ADL either performed or assessed with clinical judgement   ADL Overall ADL's : Needs assistance/impaired Eating/Feeding: Set up;Sitting   Grooming: Set up;Sitting;Bed level   Upper Body Bathing: Moderate assistance;Sitting   Lower Body Bathing: Maximal assistance;Sitting/lateral leans;Sit to/from stand   Upper Body Dressing : Minimal assistance;Sitting;Bed level   Lower Body Dressing: Moderate assistance;Maximal assistance;Sitting/lateral leans;Sit to/from stand;Bed level                 General ADL Comments: pt had just finished working with PT and very anxious about causing more pain at start of OT session. Spent most of session educating pt and wife on safe ways to complete ADL bed level and OOB pending pt function for that date. Also discussed safe DME usage     Vision Patient Visual Report: No change from baseline       Perception     Praxis      Pertinent Vitals/Pain Pain Assessment: Faces Faces Pain Scale: Hurts even more Pain Location: back with mobility Pain Descriptors / Indicators: Aching;Grimacing;Guarding Pain Intervention(s): Limited activity within patient's tolerance;Monitored during session;Repositioned     Hand Dominance     Extremity/Trunk Assessment Upper Extremity Assessment Upper Extremity Assessment: Generalized weakness   Lower Extremity Assessment Lower Extremity Assessment: Defer to PT  evaluation;LLE deficits/detail LLE Deficits / Details: Noted 3+ pitting in calf and top of foot       Communication Communication Communication: No difficulties   Cognition Arousal/Alertness: Awake/alert Behavior During Therapy: Anxious (anticipates pain and makes self nervous to mobilize) Overall Cognitive Status: Impaired/Different from baseline Area of Impairment: Safety/judgement;Memory                     Memory: Decreased short-term  memory   Safety/Judgement: Decreased awareness of deficits     General Comments: requires cues and support for encouragement and to relieve anxiety for anticipation of pain   General Comments       Exercises     Shoulder Instructions      Home Living Family/patient expects to be discharged to:: Private residence Living Arrangements: Spouse/significant other Available Help at Discharge: Family Type of Home: House Home Access: Level entry     Home Layout: One level     Bathroom Shower/Tub:  (does not get in shower, does wash ups)   Bathroom Toilet: Handicapped height     Home Equipment: Programme researcher, broadcasting/film/video - 2 wheels;Bedside commode;Hospital bed   Additional Comments: active with hospice      Prior Functioning/Environment Level of Independence: Needs assistance  Gait / Transfers Assistance Needed: Occasional assist in/out of bed. Uses RW with increased time for short household distances ADL's / Homemaking Assistance Needed: Needs assist for bathing, dressing. Has IADLs managed            OT Problem List: Decreased strength;Increased edema;Decreased knowledge of use of DME or AE;Cardiopulmonary status limiting activity;Decreased activity tolerance;Impaired balance (sitting and/or standing);Decreased cognition      OT Treatment/Interventions: Self-care/ADL training;Therapeutic exercise;Patient/family education;Balance training;Energy conservation;Therapeutic activities;DME and/or AE instruction    OT Goals(Current goals can be found in the care plan section) Acute Rehab OT Goals Patient Stated Goal: be able to go to and from bathroom OT Goal Formulation: With patient Time For Goal Achievement: 07/15/20 Potential to Achieve Goals: Good  OT Frequency: Min 2X/week   Barriers to D/C:            Co-evaluation              AM-PAC OT "6 Clicks" Daily Activity     Outcome Measure Help from another person eating meals?: A Little Help from another person  taking care of personal grooming?: A Little Help from another person toileting, which includes using toliet, bedpan, or urinal?: A Lot Help from another person bathing (including washing, rinsing, drying)?: A Lot Help from another person to put on and taking off regular upper body clothing?: A Little Help from another person to put on and taking off regular lower body clothing?: A Lot 6 Click Score: 15   End of Session Nurse Communication: Mobility status  Activity Tolerance: Patient limited by fatigue Patient left: in bed;with call bell/phone within reach;with family/visitor present;with nursing/sitter in room  OT Visit Diagnosis: Unsteadiness on feet (R26.81);Other abnormalities of gait and mobility (R26.89);Muscle weakness (generalized) (M62.81)                Time: 7035-0093 OT Time Calculation (min): 26 min Charges:  OT General Charges $OT Visit: 1 Visit OT Evaluation $OT Eval Moderate Complexity: 1 Mod OT Treatments $Self Care/Home Management : 8-22 mins  Zenovia Jarred, MSOT, OTR/L Acute Rehabilitation Services [facility] Office Number: [phone] Pager: [phone]  Zenovia Jarred 07/01/2020, 10:42 AM

## 2020-07-01 NOTE — Progress Notes (Signed)
[place] [id number]  [facility]) Hospitalized Hospice Patient (Pt) Visit Ah Bott, current hospice pt with a terminal diagnosis of hypertensive heart disease, admitted to [facility] on 02/08/20.  In the past weeks pt has been having increased BLE edema while having decreased appetite and loss of 5-6 lbs.  Pt fell on 06/29/20, falling backwards and hitting mid-back on nightstand.  T reported unmanaged pain after fall limiting mobility.  Family activated 911 after making [facility] aware.  Pt was admitted to [facility] for observation on 06/30/20 and switched to inpatient with an admitting diagnosis of acute on chronic heart failure.  Per Dr. Karie Georges, [place] MD, this is a related admission. Visited with pt at bedside who reports less pain today.  Pt AOx4, respirations even/unlabored.  Pt reports he was able to work with PT although expresses disappointment that he couldn't do more.  Pt endorses good appetite.  No family at bedside. Vital Signs:  97.9 oral, 115/62, 57 HR, 18 RR, 93% RA I/O: -/1075 Abnormal Labs:  Ca 8.3, Hgb 11.5, platelets 129, BNP 226 Diagnostics:   CT Chest w/o Contrast: IMPRESSION: 1. No well-defined fracture is identified.  No pneumothorax. 2. Large left and small right pleural effusions with associated passive atelectasis. 3. Extensive upper abdominal ascites. Extensive edema in the upper abdominal mesentery and omentum. 4. Cirrhosis. 5. Coronary, aortic arch, and branch vessel atherosclerotic vascular disease. Prior CABG. 6. Prominently diminished right renal size compatible with atrophy. 7. Hypodense lesions of the left kidney are probably cysts but technically nonspecific. 8. Small fluid density in the mediastinum anterior to one of the bypass grafts, significance uncertain. Possibly a small low-density lymph node. 9. Degenerative glenohumeral arthropathy bilaterally. Prominent degenerative sternoclavicular arthropathy bilaterally. 10. Aortic atherosclerosis.    Aortic Atherosclerosis (ICD10-I70.0).  DG Chest, 2 view:   IMPRESSION: Possible small right apical pneumothorax is noted; CT scan of the chest is recommended for further evaluation. Stable moderate left pleural effusion with probable underlying atelectasis.  DG Lumbar Spine: IMPRESSION: Multilevel degenerative disc disease. No acute abnormality is noted.    DG Thoracic Spine: IMPRESSION: Mild multilevel degenerative changes. No acute abnormality seen in the thoracic spine.    DG Femur, Right: IMPRESSION: No acute fracture or dislocation.  DG Pelvis: IMPRESSION: Status post bilateral hip replacements without acute osseous abnormality.  IV/PRN Meds:  Lasix 40 mg PIV BID, Norco 5-325 q6h PRN moderate-severe pain x 3 doses Problem List: Acute on chronic diastolic CHF exacerbation.  TTE 08/23/2018 shows preserved EF.  Patient has extensive 3+ pitting edema from feet to bilateral flanks, large left-sided pleural effusion, and dyspnea with minimal exertion despite continued use of oral diuresis prior to admission. Still significantly volume overloaded.  CT confirms cirrhosis with extensive mesenteric edema, large pleural effusion. Negative 1 L so far -continue IV Lasix 40 mg daily for volume control/symptom management per patient request -Daily weights, strict I's and O's   Large left-sided pleural effusion.  Confirmed on CT chest.  Suspect most likely etiology is exacerbation of CHF.  Patient to continue medication management with diuresis -Continue IV diuresis, patient would like to hold off on any thoracenteses as he is focused on symptom management -No Current O2 Requirements, No Respiratory Effort   Cirrhosis mesenteric edema and ascites seen on CT chest. Also evidence of mesenteric edema and ascites likely related to longstanding hepatic congestion from advanced CHF.  No history of alcohol abuse per family. --expect improvement in abdominal distension and appetite with IV diuresis.  Continue to monitor  Generalized Weakness with Recent Fall.  Highest risk factor likely being profound volume overload contributing to leg weakness.  Neurologically intact -PT recommends home health PT/OT -continue IV diuresis as mentioned above     Acute lower Back pain after fall at home.  Spinal x-rays negative for fracture.  Patient states pain is somewhat improved on pain regimen-we will continue to monitor -if Pain becomes uncontrolled can get more specific imaging to ensure no hidden fractures  Discharge Planning: unclear. Daughter [name] has advocated for rehab.  [facility] is set up to continue to support pt at home if desired.   Family Contact: no family at bedside, pt AOx4 IDT: Updated Goals of Care: DNR Should patient need ambulance transport at discharge please use [facility] as they are contracted with [facility] for this service.  Thank you for the opportunity to participate in this patient's care.  Domenic Moras, BSN, RN [facility] Liaison [phone] (24h on call)

## 2020-07-01 NOTE — Plan of Care (Signed)
  Problem: Cardiac: Goal: Ability to achieve and maintain adequate cardiopulmonary perfusion will improve Outcome: Progressing   

## 2020-07-01 NOTE — Progress Notes (Signed)
NURSING PROGRESS NOTE  ISIAAH CUERVO [id number] Admission Data: 07/01/2020 7:34 AM Attending Provider: Desiree Hane, MD QIW:[id number], [name], NP Code Status:DNR  AGAM DAVENPORT is a 86 y.o. male patient admitted from ED:  -No acute distress noted.  -No complaints of shortness of breath.  -No complaints of chest pain.   Cardiac Monitoring: Box # 23 in place. Cardiac monitor yields:normal sinus rhythm.  Blood pressure 116/61, pulse 62, temperature 98.1 F (36.7 C), temperature source Oral, resp. rate 18, height 5\' 11"  (1.803 m), weight 77 kg, SpO2 90 %.   IV Fluids:  IV in place, occlusive dsg intact without redness, IV cath wrist right, condition patent and no redness none.   Allergies:  Clindamycin palmitate hcl, Clindamycin/lincomycin, Penicillins, Sulfa antibiotics, Other, and Sulfonamide derivatives  Past Medical History:   has a past medical history of CAD (coronary artery disease), CVA (cerebral vascular accident) ([place]), Diverticulosis of rectosigmoid, DVT (deep venous thrombosis) ([place]) (2009), Groin pain, left lower quadrant, Hyperlipidemia, Hypertension, Melanoma ([place]), MELANOMA, HX OF (12/02/2008), MI (myocardial infarction) ([place]) (10/14/2006), Nephrolithiasis, Nuclear sclerotic cataract of both eyes (2015), Osteoarthritis, Prostate nodule, and Retroperitoneal hematoma (2009).  Past Surgical History:   has a past surgical history that includes Rotator cuff repair (Bilateral); status post coronary bypass graft (10/14/06 ); carpal tunnel repair B (Bilateral); trigger finger X 3; kidney stone removal (01/18/1988); Melanoma excision (08/1990); Hernia repair (05/20/2008); Colonoscopy w/ polypectomy (02/07/09); greenfield filter; Bilateral anterior total hip arthroplasty; Cardiovascular stress test (11/2011; 07/2015); Cataract extraction; and brain hemorrhage.  Social History:   reports that he has never smoked. He has never used smokeless tobacco. He reports that he does not drink  alcohol and does not use drugs.  Skin: 3cm sacral stage II, right leg and knee abrasion. Scattered bilateral upper extremity bruising. Dry flaky skin.  Patient/Family orientated to room. Information packet given to patient/family. Admission inpatient armband information verified with patient/family to include name and date of birth and placed on patient arm. Side rails up x 2, fall assessment and education completed with patient/family. Patient/family able to verbalize understanding of risk associated with falls and verbalized understanding to call for assistance before getting out of bed. Call light within reach. Patient/family able to voice and demonstrate understanding of unit orientation instructions.    Will continue to evaluate and treat per MD orders.

## 2020-07-01 NOTE — Progress Notes (Signed)
[facility]  PROGRESS NOTE  EASTIN SWING XTK:[id number] DOB: 1931/08/18 DOA: 06/30/2020 PCP: Dorothyann Peng, NP Admit date - 06/30/2020   Admitting Physician Lenore Cordia, MD  Outpatient Primary MD for the patient is Dorothyann Peng, NP  LOS - 0 Brief Narrative  Bernard Byrd is a 86 year old male with medical history significant for chronic diastolic CHF for which he is receiving home hospice care, CKD, history of CVA, HTN, HLD who presented on 11/12 after a fall with worsening shortness of breath, worsening swelling.  Increase generalized weakness, was admitted with working diagnosis of acute on chronic diastolic CHF exacerbation.  In ER, chest x-ray initially was concerning for with moderate-sized pericardial effusion, CT chest thorax showed large left pleural effusion with associated atelectasis as well as extensive upper abdominal ascites, cirrhosis.  Subjective  Today denies chest pain. Reports back pain still present but better. Reports legs feel less heavy. Worked with PT this am  A & P   Acute on chronic diastolic CHF exacerbation.  TTE 08/23/2018 shows preserved EF.  Patient has extensive 3+ pitting edema from feet to bilateral flanks, large left-sided pleural effusion, and dyspnea with minimal exertion despite continued use of oral diuresis prior to admission. Still significantly volume overloaded.  CT confirms cirrhosis with extensive mesenteric edema, large pleural effusion. Negative 1 L so far -continue IV Lasix 40 mg daily for volume control/symptom management per patient request -Daily weights, strict I's and O's  Large left-sided pleural effusion.  Confirmed on CT chest.  Suspect most likely etiology is exacerbation of CHF.  Patient to continue medication management with diuresis -Continue IV diuresis, patient would like to hold off on any thoracenteses as he is focused on symptom management -No Current O2 Requirements, No Respiratory Effort  Cirrhosis mesenteric edema  and ascites seen on CT chest. Also evidence of mesenteric edema and ascites likely related to longstanding hepatic congestion from advanced CHF.  No history of alcohol abuse per family. --expect improvement in abdominal distension and appetite with IV diuresis. Continue to monitor  Generalized Weakness with Recent Fall.  Highest risk factor likely being profound volume overload contributing to leg weakness.  Neurologically intact -PT recommends home health PT/OT -continue IV diuresis as mentioned above   Acute lower Back pain after fall at home.  Spinal x-rays negative for fracture.  Patient states pain is somewhat improved on pain regimen-we will continue to monitor -if Pain becomes uncontrolled can get more specific imaging to ensure no hidden fractures  HTN-continue home medication, Lopressor  CAD status post CABG, stable -No chest pain -Continue aspirin, Lopressor          Family Communication  :  Wife updated at bedside and called daughter for update Code Status :  DNR   Disposition Plan  :  Patient is from home under hospice care. Anticipated d/c date: 2 to 3 days. Barriers to d/c or necessity for inpatient status: needs IV lasix given profound volume overload on exam contributing to poor balance and weakness and increasing risk for further falls would advise changing from observation to inpateitn Consults  :  none  Procedures  :  none  DVT Prophylaxis  :  Lovenox  MDM: The below labs and imaging reports were reviewed and summarized above.  Medication management as above.  Lab Results  Component Value Date   PLT 129 (L) 07/01/2020    Diet :  Diet Order            DIET DYS 3  Room service appropriate? Yes with Assist; Fluid consistency: Thin  Diet effective now                  Inpatient Medications Scheduled Meds: . amLODipine  10 mg Oral Daily  . aspirin EC  81 mg Oral Daily  . enoxaparin (LOVENOX) injection  40 mg Subcutaneous QHS  . furosemide  40 mg  Intravenous Q12H  . metoprolol tartrate  50 mg Oral BID  . senna-docusate  1 tablet Oral BID   Continuous Infusions: PRN Meds:.acetaminophen **OR** acetaminophen, HYDROcodone-acetaminophen, ondansetron **OR** ondansetron (ZOFRAN) IV, polyvinyl alcohol  Antibiotics  :   Anti-infectives (From admission, onward)   None       Objective   Vitals:   06/30/20 2251 06/30/20 2300 07/01/20 0509 07/01/20 0954  BP: 127/72  116/61 (!) 104/59  Pulse: 83  62 64  Resp: 18  18 18   Temp: 98.3 F (36.8 C)  98.1 F (36.7 C) 98.1 F (36.7 C)  TempSrc: Oral  Oral Oral  SpO2: 91%  90% 92%  Weight:  76.9 kg 77 kg   Height:        SpO2: 92 %  Wt Readings from Last 3 Encounters:  07/01/20 77 kg  03/25/19 85.3 kg  03/05/19 86.2 kg     Intake/Output Summary (Last 24 hours) at 07/01/2020 1325 Last data filed at 07/01/2020 0507 Gross per 24 hour  Intake --  Output 1075 ml  Net -1075 ml    Physical Exam: Awake Alert, Oriented X 3, Normal affect No new F.N deficits,  Brooke.AT, Normal respiratory effort on room air, CTAB RRR,SEM appreciate, Extensive 3+ pitting edema from bilateral feet to mid thighs bilaterally. Decreased breath sounds at bases.  +ve B.Sounds, Abd Soft and distended with no tenderness, No rebound, guarding or rigidity. No Cyanosis, No new Rash or bruise     I have personally reviewed the following:   Data Reviewed:  CBC Recent Labs  Lab 06/30/20 1714 07/01/20 0455  WBC 8.3 4.8  HGB 13.5 11.5*  HCT 41.3 35.0*  PLT 167 129*  MCV 104.0* 104.8*  MCH 34.0 34.4*  MCHC 32.7 32.9  RDW 15.2 15.3  LYMPHSABS 2.2  --   MONOABS 0.8  --   EOSABS 0.2  --   BASOSABS 0.0  --     Chemistries  Recent Labs  Lab 06/30/20 1439 07/01/20 0455  NA 137 137  K 3.7 3.6  CL 103 103  CO2 23 26  GLUCOSE 108* 81  BUN 15 14  CREATININE 1.17 1.08  CALCIUM 8.7* 8.3*  MG  --  1.9  AST 31  --   ALT 22  --   ALKPHOS 194*  --   BILITOT 1.1  --     ------------------------------------------------------------------------------------------------------------------ No results for input(s): CHOL, HDL, LDLCALC, TRIG, CHOLHDL, LDLDIRECT in the last 72 hours.  Lab Results  Component Value Date   HGBA1C 5.5 02/21/2017   ------------------------------------------------------------------------------------------------------------------ No results for input(s): TSH, T4TOTAL, T3FREE, THYROIDAB in the last 72 hours.  Invalid input(s): FREET3 ------------------------------------------------------------------------------------------------------------------ No results for input(s): VITAMINB12, FOLATE, FERRITIN, TIBC, IRON, RETICCTPCT in the last 72 hours.  Coagulation profile No results for input(s): INR, PROTIME in the last 168 hours.  No results for input(s): DDIMER in the last 72 hours.  Cardiac Enzymes No results for input(s): CKMB, TROPONINI, MYOGLOBIN in the last 168 hours.  Invalid input(s): CK ------------------------------------------------------------------------------------------------------------------    Component Value Date/Time   BNP 226.3 (H) 06/30/2020 1439  Micro Results Recent Results (from the past 240 hour(s))  Resp Panel by RT PCR (RSV, Flu A&B, Covid) - Nasopharyngeal Swab     Status: None   Collection Time: 06/30/20  2:53 PM   Specimen: Nasopharyngeal Swab  Result Value Ref Range Status   SARS Coronavirus 2 by RT PCR NEGATIVE NEGATIVE Final    Comment: (NOTE) SARS-CoV-2 target nucleic acids are NOT DETECTED.  The SARS-CoV-2 RNA is generally detectable in upper respiratoy specimens during the acute phase of infection. The lowest concentration of SARS-CoV-2 viral copies this assay can detect is 131 copies/mL. A negative result does not preclude SARS-Cov-2 infection and should not be used as the sole basis for treatment or other patient management decisions. A negative result may occur with  improper  specimen collection/handling, submission of specimen other than nasopharyngeal swab, presence of viral mutation(s) within the areas targeted by this assay, and inadequate number of viral copies (<131 copies/mL). A negative result must be combined with clinical observations, patient history, and epidemiological information. The expected result is Negative.  Fact Sheet for Patients:  PinkCheek.be  Fact Sheet for Healthcare Providers:  GravelBags.it  This test is no t yet approved or cleared by the [place] FDA and  has been authorized for detection and/or diagnosis of SARS-CoV-2 by FDA under an Emergency Use Authorization (EUA). This EUA will remain  in effect (meaning this test can be used) for the duration of the COVID-19 declaration under Section 564(b)(1) of the Act, 21 U.S.C. section 360bbb-3(b)(1), unless the authorization is terminated or revoked sooner.     Influenza A by PCR NEGATIVE NEGATIVE Final   Influenza B by PCR NEGATIVE NEGATIVE Final    Comment: (NOTE) The Xpert Xpress SARS-CoV-2/FLU/RSV assay is intended as an aid in  the diagnosis of influenza from Nasopharyngeal swab specimens and  should not be used as a sole basis for treatment. Nasal washings and  aspirates are unacceptable for Xpert Xpress SARS-CoV-2/FLU/RSV  testing.  Fact Sheet for Patients: PinkCheek.be  Fact Sheet for Healthcare Providers: GravelBags.it  This test is not yet approved or cleared by the [place] FDA and  has been authorized for detection and/or diagnosis of SARS-CoV-2 by  FDA under an Emergency Use Authorization (EUA). This EUA will remain  in effect (meaning this test can be used) for the duration of the  Covid-19 declaration under Section 564(b)(1) of the Act, 21  U.S.C. section 360bbb-3(b)(1), unless the authorization is  terminated or revoked.     Respiratory Syncytial Virus by PCR NEGATIVE NEGATIVE Final    Comment: (NOTE) Fact Sheet for Patients: PinkCheek.be  Fact Sheet for Healthcare Providers: GravelBags.it  This test is not yet approved or cleared by the [place] FDA and  has been authorized for detection and/or diagnosis of SARS-CoV-2 by  FDA under an Emergency Use Authorization (EUA). This EUA will remain  in effect (meaning this test can be used) for the duration of the  COVID-19 declaration under Section 564(b)(1) of the Act, 21 U.S.C.  section 360bbb-3(b)(1), unless the authorization is terminated or  revoked. Performed at [facility], [place] [address] [place], [place] [address]     Radiology Reports DG Chest 2 View  Result Date: 06/30/2020 CLINICAL DATA:  Back pain after fall. EXAM: CHEST - 2 VIEW COMPARISON:  April 14, 2019. FINDINGS: Stable cardiomediastinal silhouette. Status post coronary bypass graft. Stable moderate left pleural effusion is noted with probable underlying atelectasis. Possible small right apical pneumothorax is noted. Bony  thorax is unremarkable. IMPRESSION: Possible small right apical pneumothorax is noted; CT scan of the chest is recommended for further evaluation. Stable moderate left pleural effusion with probable underlying atelectasis. Electronically Signed   By: Marijo Conception M.D.   On: 06/30/2020 14:35   DG Thoracic Spine 2 View  Result Date: 06/30/2020 CLINICAL DATA:  Back pain after fall yesterday. EXAM: THORACIC SPINE 2 VIEWS COMPARISON:  None. FINDINGS: No fracture or spondylolisthesis is noted. Anterior osteophyte formation is noted at multiple levels in the lower thoracic spine. IMPRESSION: Mild multilevel degenerative changes. No acute abnormality seen in the thoracic spine. Electronically Signed   By: Marijo Conception M.D.   On: 06/30/2020 14:32   DG Lumbar Spine Complete  Result Date: 06/30/2020 CLINICAL  DATA:  Low back pain after fall yesterday. EXAM: LUMBAR SPINE - COMPLETE 4+ VIEW COMPARISON:  None. FINDINGS: No fracture or significant spondylolisthesis is noted. Moderate degenerative disc disease is noted at L3-4. Mild degenerative disc disease is noted at L2-3 and L4-5. IMPRESSION: Multilevel degenerative disc disease. No acute abnormality is noted. Aortic Atherosclerosis (ICD10-I70.0). Electronically Signed   By: Marijo Conception M.D.   On: 06/30/2020 14:31   DG Pelvis 1-2 Views  Result Date: 06/30/2020 CLINICAL DATA:  Fall with right leg pain EXAM: PELVIS - 1-2 VIEW COMPARISON:  12/16/2016 FINDINGS: SI joints are non widened. Status post bilateral hip replacements with normal alignment. No definitive fracture is seen. Pubic symphysis is intact. There are extensive vascular calcifications. IMPRESSION: Status post bilateral hip replacements without acute osseous abnormality. Electronically Signed   By: Donavan Foil M.D.   On: 06/30/2020 21:34   CT Chest Wo Contrast  Result Date: 06/30/2020 CLINICAL DATA:  Back pain, fall. EXAM: CT CHEST WITHOUT CONTRAST TECHNIQUE: Multidetector CT imaging of the chest was performed following the standard protocol without IV contrast. COMPARISON:  Chest radiograph 07/01/2019 and chest CT from 12/07/2008 FINDINGS: Cardiovascular: Coronary, aortic arch, and branch vessel atherosclerotic vascular disease. Prior CABG. Mediastinum/Nodes: No pathologic adenopathy identified. Anterior to one of the bypass grafts, there is a small fluid density in the mediastinum measuring 2.3 by 0.9 cm on image 71 of series 3 which was not present on 12/07/2008, significance uncertain. This could be a low-density lymph node. Lungs/Pleura: Large left and small right pleural effusions with associated passive atelectasis. The left pleural effusion occupies 75% of left hemithoracic volume. Biapical pleuroparenchymal scarring in the lungs. There is no pneumothorax. The appearance on recent chest  radiography is due to skin folds. Upper Abdomen: Cirrhosis. Extensive upper abdominal ascites. Cystic lesions with faint marginal calcifications in the dome of the right and left hepatic lobe. Extensive edema in the upper abdominal mesentery and omentum. Aortoiliac atherosclerotic vascular disease. Prominently diminished right renal size compatible with atrophy. Hypodense lesion along the left renal pelvis, probably a peripelvic cyst. Other hypodense lesions of the left kidney are probably cysts but technically nonspecific. Musculoskeletal: Degenerative glenohumeral arthropathy bilaterally. Prominent degenerative sternoclavicular arthropathy bilaterally. Lower cervical spondylosis and thoracic spondylosis. No well-defined fracture. IMPRESSION: 1. No well-defined fracture is identified.  No pneumothorax. 2. Large left and small right pleural effusions with associated passive atelectasis. 3. Extensive upper abdominal ascites. Extensive edema in the upper abdominal mesentery and omentum. 4. Cirrhosis. 5. Coronary, aortic arch, and branch vessel atherosclerotic vascular disease. Prior CABG. 6. Prominently diminished right renal size compatible with atrophy. 7. Hypodense lesions of the left kidney are probably cysts but technically nonspecific. 8. Small fluid density in the mediastinum anterior  to one of the bypass grafts, significance uncertain. Possibly a small low-density lymph node. 9. Degenerative glenohumeral arthropathy bilaterally. Prominent degenerative sternoclavicular arthropathy bilaterally. 10. Aortic atherosclerosis. Aortic Atherosclerosis (ICD10-I70.0). Electronically Signed   By: Van Clines M.D.   On: 06/30/2020 18:57   DG Femur Min 2 Views Right  Result Date: 06/30/2020 CLINICAL DATA:  Fall, right leg pain, EXAM: RIGHT FEMUR 2 VIEWS COMPARISON:  None. FINDINGS: Two view radiograph right femur demonstrates surgical changes of right total hip arthroplasty. Arthroplasty components are in  anatomic alignment. No fracture or dislocation. No lytic or blastic bone lesion. Advanced vascular calcifications are seen within the medial soft tissues. IMPRESSION: No acute fracture or dislocation. Electronically Signed   By: Fidela Salisbury MD   On: 06/30/2020 21:44     Time Spent in minutes  30     Desiree Hane M.D on 07/01/2020 at 1:25 PM  To page go to [email or web address] - password [email or web address]

## 2020-07-01 NOTE — Evaluation (Signed)
Physical Therapy Evaluation Patient Details Name: Bernard Byrd MRN: [id number] DOB: [birth year] Today's Date: 07/01/2020   History of Present Illness  86 y.o male presenting s/p fall at home. Imaging neg for acute abnormality. PMH includes chronic diastolic CHF, CAD s/p CABG, history of CVA, HTN, HLD, and currently active with home hospice  Clinical Impression  Pt admitted following fall; pt was not I prior to admission and occasionally requiring assist for bed mobility, transfers and ambulation; pt is now requiring increased assistance during bed mobility, transfers and ambulation with RW; pt with increased anxiety due to pain and falling; pt states he can't move his legs, but was able to perform without assist with verbal cueing; pt will benefit form skilled PT to address deficits in strength to improve bed mobility, transfers and short ambulation to return to PLOF prior to discharge. Recommend HHPT to progress pt to PLOF with functional mobility tasks    Follow Up Recommendations Home health PT    Equipment Recommendations  Other (comment) (possible w/c pending progress with therapy)    Recommendations for Other Services       Precautions / Restrictions Precautions Precautions: Fall Restrictions Weight Bearing Restrictions: No      Mobility  Bed Mobility Overal bed mobility: Needs Assistance Bed Mobility: Supine to Sit;Sit to Supine Rolling: Max assist;Mod assist   Supine to sit: Mod assist;HOB elevated Sit to supine: HOB elevated;Mod assist   General bed mobility comments: pt requiring max verbal cueing to initiate movement for supine>sit; pt requiring assist initially to coordinate moving LE so EOB, with increased time pt able to perform without assist and verbal cueing only; pt requiring assist wtih trunk for supine>sit; pt requiring assist with B LE for sit>supine with HOB elevated; pt very anxious of pain and falling    Transfers Overall transfer level: Needs  assistance Equipment used: Rolling walker (2 wheeled) Transfers: Sit to/from Stand Sit to Stand: Min assist;From elevated surface         General transfer comment: increased time and B UEs on RW  Ambulation/Gait Ambulation/Gait assistance: Min assist Gait Distance (Feet): 2 Feet Assistive device: Rolling walker (2 wheeled)       General Gait Details: side stepping to [facility]; pt requiring verbal cueing for sequencing movement of B LEs and min A for balance  Stairs            Wheelchair Mobility    Modified Rankin (Stroke Patients Only)       Balance Overall balance assessment: Needs assistance Sitting-balance support: Bilateral upper extremity supported;Feet unsupported Sitting balance-Leahy Scale: Fair Sitting balance - Comments: pt sat EOB x 5 minutes with no increase in pain noted   Standing balance support: Bilateral upper extremity supported Standing balance-Leahy Scale: Poor Standing balance comment: min A and B UE support on RW                             Pertinent Vitals/Pain Pain Assessment: 0-10 Pain Score: 6  Faces Pain Scale: Hurts even more Pain Location: back with mobility Pain Descriptors / Indicators: Aching;Grimacing;Guarding Pain Intervention(s): Limited activity within patient's tolerance;Monitored during session;Repositioned    Home Living Family/patient expects to be discharged to:: Private residence Living Arrangements: Spouse/significant other Available Help at Discharge: Family Type of Home: House Home Access: Level entry     Home Layout: One level Home Equipment: Programme researcher, broadcasting/film/video - 2 wheels;Bedside commode;Hospital bed Additional Comments: active with hospice  Prior Function Level of Independence: Needs assistance   Gait / Transfers Assistance Needed: Occasional assist in/out of bed. Uses RW with increased time for short household distances  ADL's / Homemaking Assistance Needed: Needs assist for bathing,  dressing. Has IADLs managed        Hand Dominance        Extremity/Trunk Assessment   Upper Extremity Assessment Upper Extremity Assessment: Defer to OT evaluation    Lower Extremity Assessment Lower Extremity Assessment: RLE deficits/detail RLE Deficits / Details: 3+/5 LLE Deficits / Details: Noted 3+ pitting in calf and top of foot; strength 3-/5    Cervical / Trunk Assessment Cervical / Trunk Assessment: Kyphotic  Communication   Communication: No difficulties  Cognition Arousal/Alertness: Awake/alert Behavior During Therapy: Anxious (anticipates pain and makes self nervous to mobilize) Overall Cognitive Status: Impaired/Different from baseline Area of Impairment: Safety/judgement;Memory                     Memory: Decreased short-term memory   Safety/Judgement: Decreased awareness of deficits     General Comments: requires cues and support for encouragement and to relieve anxiety for anticipation of pain      General Comments General comments (skin integrity, edema, etc.): VSS    Exercises     Assessment/Plan    PT Assessment Patient needs continued PT services  PT Problem List Decreased strength;Decreased mobility;Decreased coordination;Decreased activity tolerance;Decreased balance;Decreased knowledge of use of DME       PT Treatment Interventions Gait training;Balance training;Therapeutic exercise;Functional mobility training;Therapeutic activities;Patient/family education    PT Goals (Current goals can be found in the Care Plan section)  Acute Rehab PT Goals Patient Stated Goal: be able to go to and from bathroom PT Goal Formulation: With patient/family Time For Goal Achievement: 07/15/20 Potential to Achieve Goals: Fair    Frequency Min 3X/week   Barriers to discharge        Co-evaluation               AM-PAC PT "6 Clicks" Mobility  Outcome Measure Help needed turning from your back to your side while in a flat bed without  using bedrails?: A Lot Help needed moving from lying on your back to sitting on the side of a flat bed without using bedrails?: A Lot Help needed moving to and from a bed to a chair (including a wheelchair)?: A Little Help needed standing up from a chair using your arms (e.g., wheelchair or bedside chair)?: A Little Help needed to walk in hospital room?: A Lot Help needed climbing 3-5 steps with a railing? : Total 6 Click Score: 13    End of Session Equipment Utilized During Treatment: Gait belt Activity Tolerance: Patient tolerated treatment well;Patient limited by pain Patient left: in bed;with call bell/phone within reach;Other (comment) (OT in room for evaluation) Nurse Communication: Mobility status PT Visit Diagnosis: Unsteadiness on feet (R26.81);Other abnormalities of gait and mobility (R26.89);Muscle weakness (generalized) (M62.81);History of falling (Z91.81);Pain Pain - part of body:  (low back)    Time: 5638-9373 PT Time Calculation (min) (ACUTE ONLY): 53 min   Charges:   PT Evaluation $PT Eval Moderate Complexity: 1 Mod PT Treatments $Therapeutic Activity: 23-37 mins        Lyanne Co, DPT Acute Rehabilitation Services [phone]  [name] 07/01/2020, 11:24 AM

## 2020-07-02 LAB — BASIC METABOLIC PANEL
Anion gap: 8 (ref 5–15)
BUN: 14 mg/dL (ref 8–23)
CO2: 27 mmol/L (ref 22–32)
Calcium: 7.9 mg/dL — ABNORMAL LOW (ref 8.9–10.3)
Chloride: 101 mmol/L (ref 98–111)
Creatinine, Ser: 1.05 mg/dL (ref 0.61–1.24)
GFR, Estimated: >60 mL/min (ref >60)
Glucose, Bld: 78 mg/dL (ref 70–99)
Potassium: 3.5 mmol/L (ref 3.5–5.1)
Sodium: 136 mmol/L (ref 135–145)

## 2020-07-02 MED ORDER — ACETAMINOPHEN 325 MG PO TABS
650.0000 mg | ORAL_TABLET | Freq: Three times a day (TID) | ORAL | Status: DC
  Administered 2020-07-02: 650 mg via ORAL
  Administered 2020-07-02: 325 mg via ORAL
  Administered 2020-07-03 – 2020-07-08 (×15): 650 mg via ORAL
  Filled 2020-07-02 (×18): qty 2

## 2020-07-02 MED ORDER — OXYCODONE HCL 5 MG PO TABS
5.0000 mg | ORAL_TABLET | ORAL | Status: DC | PRN
  Administered 2020-07-02 – 2020-07-08 (×8): 5 mg via ORAL
  Filled 2020-07-02 (×9): qty 1

## 2020-07-02 MED ORDER — METOPROLOL TARTRATE 25 MG PO TABS
25.0000 mg | ORAL_TABLET | Freq: Two times a day (BID) | ORAL | Status: DC
  Administered 2020-07-02 – 2020-07-03 (×3): 25 mg via ORAL
  Filled 2020-07-02 (×4): qty 1

## 2020-07-02 MED ORDER — ACETAMINOPHEN 650 MG RE SUPP: 650.0000 mg | Freq: Three times a day (TID) | RECTAL | Status: DC

## 2020-07-02 NOTE — Progress Notes (Signed)
Inform about the repositioning every 2 hours, however pt stated that his 2 spots on his butt mostly on the right side and he also said " I am fine on this position right now." will encourage

## 2020-07-02 NOTE — Progress Notes (Signed)
Held patient's lopressor due to low bp and hr. He had just been given Norco for pain.

## 2020-07-02 NOTE — Progress Notes (Signed)
[place] [id number]  [facility]) Hospitalized Hospice Patient   Mr. Coomes is under hospice services with a terminal diagnosis of hypertensive heart disease, admitted to hospice services on 02/08/20.  In the past weeks pt has been having increased BLE edema while having decreased appetite and weight loss of 5-6 lbs.  Pt fell on 06/29/20, falling backwards and hitting his mid-back on nightstand. Pt unable to stand after fall, EMS was activated and pt was transported to ED for further evaluation.  Pt was admitted to [facility] for acute on chronic heart failure.  Per Dr. Karie Georges with [facility], this is a related hospital admission.  Visited pt at the bedside, wife present in room. Pt had just mobilized to the recliner, this was the first time he was able to stand since his fall. Per wife, he did "ok with PT", he required significant assistance. Pt will not be able to return home in his current condition. Family would like to see if he can regain some mobility by going to SNF.   Discussed with his daughter, [name] who is a NP, the family is aligned on optimizing him so he can hopefully return safely back home with hospice. Family would like to revoke their hospice benefit once he discharges to SNF.  [facility] will assist with this once placement has been determined.  V/S: 111/65, HR 63, RR 18, SPO2 95 % RA I&O: 900/1400 Labs: unremarkable today IVs/PRNs: lasix 40 mg IV BID, oxy IR 5 mg PO, hydrocodone 5/325 PO x 2  Problem List: - acute on chronic HF exacerbation - continue IV lasix, on room air, marked dyspnea with exertion  - acute lower back pain s/p fall - scheduled tylenol, oxycodone IR q4 as needed  GOC: clear, conservative management  D/C Planning: SNF. Pt cannot return to his home unable to walk, his wife is unable to care for him. He was previously independent with a walker. Family requests that they revoke his hospice benefit to allow his traditional medicare benefit to pay for SNF.  Family:  present in room IDT: hospice team updated  Venia Carbon RN, BSN, [facility] Liaison

## 2020-07-02 NOTE — Plan of Care (Signed)
  Problem: Education: Goal: Ability to verbalize understanding of medication therapies will improve Outcome: Progressing   Problem: Cardiac: Goal: Ability to achieve and maintain adequate cardiopulmonary perfusion will improve Outcome: Progressing   

## 2020-07-02 NOTE — Social Work (Signed)
CSW spoke with patient's daughter [name] in regards to patient's discharge plan. [name] informed CSW that they want to pursue SNF for patient.  CSW followed up with Venia Carbon from [facility] to confirm patient's hospice status. Anderson Malta explained that she had spoken with daughter and they will discontinue hospice for patient to go to a SNF and palliative will follow at SNF.  TOC team will continue to assist with discharge planning needs.

## 2020-07-02 NOTE — Progress Notes (Signed)
[facility]  PROGRESS NOTE  MANU RUBEY MCN:[id number] DOB: February 05, 1931 DOA: 06/30/2020 PCP: Dorothyann Peng, NP Admit date - 06/30/2020   Admitting Physician Desiree Hane, MD  Outpatient Primary MD for the patient is Dorothyann Peng, NP  LOS - 1 Brief Narrative  [name] is a 86 year old male with medical history significant for chronic diastolic CHF for which he is receiving home hospice care, CKD, history of CVA, HTN, HLD who presented on 11/12 after a fall with worsening shortness of breath, worsening swelling.  Increase generalized weakness, was admitted with working diagnosis of acute on chronic diastolic CHF exacerbation.  In ER, chest x-ray initially was concerning for with moderate-sized pericardial effusion, CT chest thorax showed large left pleural effusion with associated atelectasis as well as extensive upper abdominal ascites, cirrhosis.  Subjective  Today states the pain medicine helps with the back pain. Feels like the medicine is making his legs less heavy. Really wants to be able to go back home as he doesn't want to suffere  A & P   Acute on chronic diastolic CHF exacerbation,improving.  TTE 08/23/2018 shows preserved EF.  Patient has extensive 3+ pitting edema from feet to bilateral flanks, large left-sided pleural effusion, and dyspnea with minimal exertion despite continued use of oral diuresis prior to admission. Still significantly volume overloaded.  CT confirms cirrhosis with extensive mesenteric edema, large pleural effusion. Negative 1 .4 L out in last 24 hours. Less peripheral edema but still pitting to thighs and abdominal distension.  -continue IV Lasix 40 mg daily for volume control/symptom management per patient request for another 24 hours, may be able to transition to oral on tomorrow.  Large left-sided pleural effusion.  Confirmed on CT chest.  Suspect most likely etiology is exacerbation of CHF.  Patient to continue medication management with  diuresis -Continue IV diuresis, patient would like to hold off on any thoracenteses as he is focused on symptom management -No Current O2 Requirements, No increased Respiratory Effort  Cirrhosis mesenteric edema and ascites seen on CT chest. Also evidence of mesenteric edema and ascites likely related to longstanding hepatic congestion from advanced CHF.  No history of alcohol abuse per family. Denies abdominal pain --expect improvement in abdominal distension and appetite with IV diuresis. Continue to monitor  Generalized Weakness with Recent Fall.  Highest risk factor likely being profound volume overload contributing to leg weakness.  Neurologically intact -PT recommends home health PT/OT -continue IV diuresis as mentioned above  Acute lower Back pain after fall at home.  Spinal x-rays negative for fracture.  Patient states pain is somewhat improved on pain regimen but doesn't last long and has more pain when initiating movement. Under hospice care would not want any surgical intervention if there is a fracture --no further imaging -scheduled tylenol to TID dosing, discontinue norco for oxycodone IR 5 mg q4 hrs PRN pain -if Pain becomes uncontrolled can get more specific imaging to ensure no hidden fractures  HTN -continue home medication, Lopressor  CAD status post CABG, stable -No chest pain -Continue aspirin, Lopressor    Family Communication  :  Wife and daughter updated at bedside  Code Status :  DNR   Disposition Plan  :  Patient is from home under hospice care. Anticipated d/c date: 1 to 2 days. Barriers to d/c or necessity for inpatient status: needs IV lasix given profound volume overload on exam contributing to poor balance and weakness and increasing risk for further falls, likely dc in 24 hours  Consults  :  none  Procedures  :  none  DVT Prophylaxis  :  Lovenox  MDM: The below labs and imaging reports were reviewed and summarized above.  Medication management as  above.  Lab Results  Component Value Date   PLT 129 (L) 07/01/2020    Diet :  Diet Order            DIET DYS 3 Room service appropriate? Yes with Assist; Fluid consistency: Thin  Diet effective now                  Inpatient Medications Scheduled Meds: . acetaminophen  650 mg Oral TID   Or  . acetaminophen  650 mg Rectal TID  . amLODipine  10 mg Oral Daily  . aspirin EC  81 mg Oral Daily  . enoxaparin (LOVENOX) injection  40 mg Subcutaneous QHS  . furosemide  40 mg Intravenous Q12H  . metoprolol tartrate  25 mg Oral BID  . senna-docusate  1 tablet Oral BID   Continuous Infusions: PRN Meds:.ondansetron **OR** ondansetron (ZOFRAN) IV, oxyCODONE, polyvinyl alcohol  Antibiotics  :   Anti-infectives (From admission, onward)   None       Objective   Vitals:   07/01/20 1658 07/01/20 2000 07/02/20 0500 07/02/20 0907  BP: 115/62 103/64 95/60 105/67  Pulse: (!) 57  68 82  Resp: 18 18 18 18   Temp: 97.9 F (36.6 C) 98.2 F (36.8 C) 97.9 F (36.6 C) 98.6 F (37 C)  TempSrc: Oral Oral Oral Oral  SpO2: 93% 93% 92% 93%  Weight:   76 kg   Height:        SpO2: 93 %  Wt Readings from Last 3 Encounters:  07/02/20 76 kg  03/25/19 85.3 kg  03/05/19 86.2 kg     Intake/Output Summary (Last 24 hours) at 07/02/2020 1124 Last data filed at 07/02/2020 0531 Gross per 24 hour  Intake 600 ml  Output 1400 ml  Net -800 ml    Physical Exam: Awake Alert, Oriented X 3, Normal affect No new F.N deficits,  .AT, Normal respiratory effort on room air, CTAB RRR,SEM appreciated, Extensive 2+ pitting edema in bilateral thighs.. Decreased breath sounds at bases.  +ve B.Sounds, Abd Soft and distended with no tenderness, No rebound, guarding or rigidity. No Cyanosis, No new Rash or bruise     I have personally reviewed the following:   Data Reviewed:  CBC Recent Labs  Lab 06/30/20 1714 07/01/20 0455  WBC 8.3 4.8  HGB 13.5 11.5*  HCT 41.3 35.0*  PLT 167 129*  MCV  104.0* 104.8*  MCH 34.0 34.4*  MCHC 32.7 32.9  RDW 15.2 15.3  LYMPHSABS 2.2  --   MONOABS 0.8  --   EOSABS 0.2  --   BASOSABS 0.0  --     Chemistries  Recent Labs  Lab 06/30/20 1439 07/01/20 0455 07/02/20 0141  NA 137 137 136  K 3.7 3.6 3.5  CL 103 103 101  CO2 23 26 27   GLUCOSE 108* 81 78  BUN 15 14 14   CREATININE 1.17 1.08 1.05  CALCIUM 8.7* 8.3* 7.9*  MG  --  1.9  --   AST 31  --   --   ALT 22  --   --   ALKPHOS 194*  --   --   BILITOT 1.1  --   --    ------------------------------------------------------------------------------------------------------------------ No results for input(s): CHOL, HDL, LDLCALC, TRIG, CHOLHDL, LDLDIRECT in the  last 72 hours.  Lab Results  Component Value Date   HGBA1C 5.5 02/21/2017   ------------------------------------------------------------------------------------------------------------------ No results for input(s): TSH, T4TOTAL, T3FREE, THYROIDAB in the last 72 hours.  Invalid input(s): FREET3 ------------------------------------------------------------------------------------------------------------------ No results for input(s): VITAMINB12, FOLATE, FERRITIN, TIBC, IRON, RETICCTPCT in the last 72 hours.  Coagulation profile No results for input(s): INR, PROTIME in the last 168 hours.  No results for input(s): DDIMER in the last 72 hours.  Cardiac Enzymes No results for input(s): CKMB, TROPONINI, MYOGLOBIN in the last 168 hours.  Invalid input(s): CK ------------------------------------------------------------------------------------------------------------------    Component Value Date/Time   BNP 226.3 (H) 06/30/2020 1439    Micro Results Recent Results (from the past 240 hour(s))  Resp Panel by RT PCR (RSV, Flu A&B, Covid) - Nasopharyngeal Swab     Status: None   Collection Time: 06/30/20  2:53 PM   Specimen: Nasopharyngeal Swab  Result Value Ref Range Status   SARS Coronavirus 2 by RT PCR NEGATIVE NEGATIVE  Final    Comment: (NOTE) SARS-CoV-2 target nucleic acids are NOT DETECTED.  The SARS-CoV-2 RNA is generally detectable in upper respiratoy specimens during the acute phase of infection. The lowest concentration of SARS-CoV-2 viral copies this assay can detect is 131 copies/mL. A negative result does not preclude SARS-Cov-2 infection and should not be used as the sole basis for treatment or other patient management decisions. A negative result may occur with  improper specimen collection/handling, submission of specimen other than nasopharyngeal swab, presence of viral mutation(s) within the areas targeted by this assay, and inadequate number of viral copies (<131 copies/mL). A negative result must be combined with clinical observations, patient history, and epidemiological information. The expected result is Negative.  Fact Sheet for Patients:  PinkCheek.be  Fact Sheet for Healthcare Providers:  GravelBags.it  This test is no t yet approved or cleared by the [place] FDA and  has been authorized for detection and/or diagnosis of SARS-CoV-2 by FDA under an Emergency Use Authorization (EUA). This EUA will remain  in effect (meaning this test can be used) for the duration of the COVID-19 declaration under Section 564(b)(1) of the Act, 21 U.S.C. section 360bbb-3(b)(1), unless the authorization is terminated or revoked sooner.     Influenza A by PCR NEGATIVE NEGATIVE Final   Influenza B by PCR NEGATIVE NEGATIVE Final    Comment: (NOTE) The Xpert Xpress SARS-CoV-2/FLU/RSV assay is intended as an aid in  the diagnosis of influenza from Nasopharyngeal swab specimens and  should not be used as a sole basis for treatment. Nasal washings and  aspirates are unacceptable for Xpert Xpress SARS-CoV-2/FLU/RSV  testing.  Fact Sheet for Patients: PinkCheek.be  Fact Sheet for Healthcare  Providers: GravelBags.it  This test is not yet approved or cleared by the [place] FDA and  has been authorized for detection and/or diagnosis of SARS-CoV-2 by  FDA under an Emergency Use Authorization (EUA). This EUA will remain  in effect (meaning this test can be used) for the duration of the  Covid-19 declaration under Section 564(b)(1) of the Act, 21  U.S.C. section 360bbb-3(b)(1), unless the authorization is  terminated or revoked.    Respiratory Syncytial Virus by PCR NEGATIVE NEGATIVE Final    Comment: (NOTE) Fact Sheet for Patients: PinkCheek.be  Fact Sheet for Healthcare Providers: GravelBags.it  This test is not yet approved or cleared by the [place] FDA and  has been authorized for detection and/or diagnosis of SARS-CoV-2 by  FDA under an Emergency Use Authorization (EUA).  This EUA will remain  in effect (meaning this test can be used) for the duration of the  COVID-19 declaration under Section 564(b)(1) of the Act, 21 U.S.C.  section 360bbb-3(b)(1), unless the authorization is terminated or  revoked. Performed at [facility], [place] [address], [place],  [address]     Radiology Reports DG Chest 2 View  Result Date: 06/30/2020 CLINICAL DATA:  Back pain after fall. EXAM: CHEST - 2 VIEW COMPARISON:  April 14, 2019. FINDINGS: Stable cardiomediastinal silhouette. Status post coronary bypass graft. Stable moderate left pleural effusion is noted with probable underlying atelectasis. Possible small right apical pneumothorax is noted. Bony thorax is unremarkable. IMPRESSION: Possible small right apical pneumothorax is noted; CT scan of the chest is recommended for further evaluation. Stable moderate left pleural effusion with probable underlying atelectasis. Electronically Signed   By: Marijo Conception M.D.   On: 06/30/2020 14:35   DG Thoracic Spine 2 View  Result  Date: 06/30/2020 CLINICAL DATA:  Back pain after fall yesterday. EXAM: THORACIC SPINE 2 VIEWS COMPARISON:  None. FINDINGS: No fracture or spondylolisthesis is noted. Anterior osteophyte formation is noted at multiple levels in the lower thoracic spine. IMPRESSION: Mild multilevel degenerative changes. No acute abnormality seen in the thoracic spine. Electronically Signed   By: Marijo Conception M.D.   On: 06/30/2020 14:32   DG Lumbar Spine Complete  Result Date: 06/30/2020 CLINICAL DATA:  Low back pain after fall yesterday. EXAM: LUMBAR SPINE - COMPLETE 4+ VIEW COMPARISON:  None. FINDINGS: No fracture or significant spondylolisthesis is noted. Moderate degenerative disc disease is noted at L3-4. Mild degenerative disc disease is noted at L2-3 and L4-5. IMPRESSION: Multilevel degenerative disc disease. No acute abnormality is noted. Aortic Atherosclerosis (ICD10-I70.0). Electronically Signed   By: Marijo Conception M.D.   On: 06/30/2020 14:31   DG Pelvis 1-2 Views  Result Date: 06/30/2020 CLINICAL DATA:  Fall with right leg pain EXAM: PELVIS - 1-2 VIEW COMPARISON:  12/16/2016 FINDINGS: SI joints are non widened. Status post bilateral hip replacements with normal alignment. No definitive fracture is seen. Pubic symphysis is intact. There are extensive vascular calcifications. IMPRESSION: Status post bilateral hip replacements without acute osseous abnormality. Electronically Signed   By: Donavan Foil M.D.   On: 06/30/2020 21:34   CT Chest Wo Contrast  Result Date: 06/30/2020 CLINICAL DATA:  Back pain, fall. EXAM: CT CHEST WITHOUT CONTRAST TECHNIQUE: Multidetector CT imaging of the chest was performed following the standard protocol without IV contrast. COMPARISON:  Chest radiograph 07/01/2019 and chest CT from 12/07/2008 FINDINGS: Cardiovascular: Coronary, aortic arch, and branch vessel atherosclerotic vascular disease. Prior CABG. Mediastinum/Nodes: No pathologic adenopathy identified. Anterior to one of  the bypass grafts, there is a small fluid density in the mediastinum measuring 2.3 by 0.9 cm on image 71 of series 3 which was not present on 12/07/2008, significance uncertain. This could be a low-density lymph node. Lungs/Pleura: Large left and small right pleural effusions with associated passive atelectasis. The left pleural effusion occupies 75% of left hemithoracic volume. Biapical pleuroparenchymal scarring in the lungs. There is no pneumothorax. The appearance on recent chest radiography is due to skin folds. Upper Abdomen: Cirrhosis. Extensive upper abdominal ascites. Cystic lesions with faint marginal calcifications in the dome of the right and left hepatic lobe. Extensive edema in the upper abdominal mesentery and omentum. Aortoiliac atherosclerotic vascular disease. Prominently diminished right renal size compatible with atrophy. Hypodense lesion along the left renal pelvis, probably a peripelvic cyst.  Other hypodense lesions of the left kidney are probably cysts but technically nonspecific. Musculoskeletal: Degenerative glenohumeral arthropathy bilaterally. Prominent degenerative sternoclavicular arthropathy bilaterally. Lower cervical spondylosis and thoracic spondylosis. No well-defined fracture. IMPRESSION: 1. No well-defined fracture is identified.  No pneumothorax. 2. Large left and small right pleural effusions with associated passive atelectasis. 3. Extensive upper abdominal ascites. Extensive edema in the upper abdominal mesentery and omentum. 4. Cirrhosis. 5. Coronary, aortic arch, and branch vessel atherosclerotic vascular disease. Prior CABG. 6. Prominently diminished right renal size compatible with atrophy. 7. Hypodense lesions of the left kidney are probably cysts but technically nonspecific. 8. Small fluid density in the mediastinum anterior to one of the bypass grafts, significance uncertain. Possibly a small low-density lymph node. 9. Degenerative glenohumeral arthropathy bilaterally.  Prominent degenerative sternoclavicular arthropathy bilaterally. 10. Aortic atherosclerosis. Aortic Atherosclerosis (ICD10-I70.0). Electronically Signed   By: Van Clines M.D.   On: 06/30/2020 18:57   DG Femur Min 2 Views Right  Result Date: 06/30/2020 CLINICAL DATA:  Fall, right leg pain, EXAM: RIGHT FEMUR 2 VIEWS COMPARISON:  None. FINDINGS: Two view radiograph right femur demonstrates surgical changes of right total hip arthroplasty. Arthroplasty components are in anatomic alignment. No fracture or dislocation. No lytic or blastic bone lesion. Advanced vascular calcifications are seen within the medial soft tissues. IMPRESSION: No acute fracture or dislocation. Electronically Signed   By: Fidela Salisbury MD   On: 06/30/2020 21:44     Time Spent in minutes  30     Desiree Hane M.D on 07/02/2020 at 11:24 AM  To page go to [email or web address] - password [email or web address]

## 2020-07-02 NOTE — Progress Notes (Signed)
Occupational Therapy Treatment Patient Details Name: Bernard Byrd MRN: [id number] DOB: 1930/11/02 Today's Date: 07/02/2020    History of present illness 86 y.o male presenting s/p fall at home. Imaging neg for acute abnormality. PMH includes chronic diastolic CHF, CAD s/p CABG, history of CVA, HTN, HLD, and currently active with home hospice   OT comments  Pt seen for OT Follow up session with presence of daughter and wife. Pt completed bed mobility with mod A and sit <> stands with min/mod A +2 and RW this session. He was able to pivot over to recliner chair where he had a ~3 min seated rest break. After seated rest break, pt then able to mobilize around bed just before sink before fatiguing and needing another seated rest break. Pt c/o of shoulder pain (R>L) with BUE WB on RW. He remains most motivated with goal of being able to walk to the bathroom. Spoke with daughter at end of session, she is wanting SNF at d/c to support and progress pt mobility prior to returning home at this time. D/c recs updated. Will continue to follow.    Follow Up Recommendations  SNF    Equipment Recommendations  None recommended by OT    Recommendations for Other Services      Precautions / Restrictions Precautions Precautions: Fall Restrictions Weight Bearing Restrictions: No       Mobility Bed Mobility Overal bed mobility: Needs Assistance Bed Mobility: Supine to Sit     Supine to sit: Mod assist;HOB elevated     General bed mobility comments: pt requiring max verbal cueing to initiate movement for supine>sit; pt requiring assist initially to coordinate moving LE so EOB, with increased time pt able to perform without assist and verbal cueing only; pt requiring assist wtih trunk for supine>sit; pt requiring assist with B LE for sit>supine with HOB elevated; pt very anxious of pain and falling  Transfers Overall transfer level: Needs assistance Equipment used: Rolling walker (2  wheeled) Transfers: Sit to/from Stand Sit to Stand: Min assist;+2 physical assistance;+2 safety/equipment;Mod assist         General transfer comment: increased level of assist pending surface height    Balance Overall balance assessment: Needs assistance Sitting-balance support: Bilateral upper extremity supported;Feet unsupported Sitting balance-Leahy Scale: Fair     Standing balance support: Bilateral upper extremity supported;During functional activity Standing balance-Leahy Scale: Poor Standing balance comment: external assist from PT/PT and UE support from RW                           ADL either performed or assessed with clinical judgement   ADL                                               Vision Patient Visual Report: No change from baseline     Perception     Praxis      Cognition Arousal/Alertness: Awake/alert Behavior During Therapy: Anxious Overall Cognitive Status: Impaired/Different from baseline Area of Impairment: Safety/judgement;Memory                     Memory: Decreased short-term memory   Safety/Judgement: Decreased awareness of deficits     General Comments: requires cues and support for encouragement and to relieve anxiety for anticipation of pain  Exercises     Shoulder Instructions       General Comments      Pertinent Vitals/ Pain       Pain Assessment: 0-10 Pain Score: 6  Pain Location: back with mobility Pain Descriptors / Indicators: Aching;Grimacing;Guarding Pain Intervention(s): Limited activity within patient's tolerance;Monitored during session;Repositioned  Home Living                                          Prior Functioning/Environment              Frequency  Min 2X/week        Progress Toward Goals  OT Goals(current goals can now be found in the care plan section)  Progress towards OT goals: Progressing toward goals  Acute Rehab OT  Goals Patient Stated Goal: be able to go to and from bathroom OT Goal Formulation: With patient Time For Goal Achievement: 07/15/20 Potential to Achieve Goals: Good  Plan Discharge plan needs to be updated    Co-evaluation      Reason for Co-Treatment: For patient/therapist safety PT goals addressed during session: Mobility/safety with mobility;Balance;Proper use of DME;Strengthening/ROM OT goals addressed during session: ADL's and self-care;Proper use of Adaptive equipment and DME;Strengthening/ROM      AM-PAC OT "6 Clicks" Daily Activity     Outcome Measure   Help from another person eating meals?: A Little Help from another person taking care of personal grooming?: A Little Help from another person toileting, which includes using toliet, bedpan, or urinal?: A Lot Help from another person bathing (including washing, rinsing, drying)?: A Lot Help from another person to put on and taking off regular upper body clothing?: A Little Help from another person to put on and taking off regular lower body clothing?: A Lot 6 Click Score: 15    End of Session Equipment Utilized During Treatment: Gait belt;Rolling walker  OT Visit Diagnosis: Unsteadiness on feet (R26.81);Other abnormalities of gait and mobility (R26.89);Muscle weakness (generalized) (M62.81)   Activity Tolerance Patient tolerated treatment well   Patient Left in bed;with call bell/phone within reach;with family/visitor present;with nursing/sitter in room   Nurse Communication Mobility status        Time: 5537-4827 OT Time Calculation (min): 26 min  Charges: OT General Charges $OT Visit: 1 Visit OT Treatments $Self Care/Home Management : 8-22 mins  Zenovia Jarred, MSOT, OTR/L [facility] Office Number: [phone] Pager: [phone]  Zenovia Jarred 07/02/2020, 12:42 PM

## 2020-07-02 NOTE — Progress Notes (Signed)
Physical Therapy Treatment Patient Details Name: Bernard Byrd MRN: [id number] DOB: 02-Jul-1931 Today's Date: 07/02/2020    History of Present Illness 86 y.o male presenting s/p fall at home. Imaging neg for acute abnormality. PMH includes chronic diastolic CHF, CAD s/p CABG, history of CVA, HTN, HLD, and currently active with home hospice    PT Comments    Pt with improved participation in session; PT and OT co treat to increase participation in session and for safety; pt more motivated to ambulate during session with RW; patients family present during session and they are unable to provide level of assist needed for bed mobility at this time, pt would benefit from short term rehab to improve strength, endurance, coordination, balance and safety with DME to return to PLOF    Follow Up Recommendations  SNF     Equipment Recommendations  Other (comment) (to be determined by next facility)    Recommendations for Other Services       Precautions / Restrictions Precautions Precautions: Fall Restrictions Weight Bearing Restrictions: No    Mobility  Bed Mobility Overal bed mobility: Needs Assistance Bed Mobility: Supine to Sit     Supine to sit: Mod assist;HOB elevated     General bed mobility comments: pt requiring max verbal cueing to initiate movement for supine>sit; pt requiring assist initially to coordinate moving LE so EOB, with increased time pt able to perform without assist and verbal cueing only; pt requiring assist wtih trunk for supine>sit; pt requiring assist with B LE for sit>supine with HOB elevated; pt very anxious of pain and falling  Transfers Overall transfer level: Needs assistance Equipment used: Rolling walker (2 wheeled) Transfers: Sit to/from Stand Sit to Stand: +2 safety/equipment;Min assist;From elevated surface         General transfer comment: min A of 2 from elevated surface; mod A of 1 and min A of 1 from recliner; pt with increased fear of  falling  Ambulation/Gait Ambulation/Gait assistance: Min assist Gait Distance (Feet): 16 Feet Assistive device: Rolling walker (2 wheeled) Gait Pattern/deviations: Step-to pattern;Narrow base of support;Trunk flexed         Stairs             Wheelchair Mobility    Modified Rankin (Stroke Patients Only)       Balance                                            Cognition                                              Exercises      General Comments        Pertinent Vitals/Pain Pain Assessment: 0-10 Pain Score: 6  Pain Location: back with mobility Pain Descriptors / Indicators: Aching;Grimacing;Guarding    Home Living                      Prior Function            PT Goals (current goals can now be found in the care plan section) Acute Rehab PT Goals Patient Stated Goal: be able to go to and from bathroom PT Goal Formulation: With patient/family Time For Goal Achievement: 07/15/20 Potential to Achieve  Goals: Fair Progress towards PT goals: Progressing toward goals    Frequency           PT Plan Discharge plan needs to be updated    Co-evaluation PT/OT/SLP Co-Evaluation/Treatment: Yes Reason for Co-Treatment: For patient/therapist safety PT goals addressed during session: Mobility/safety with mobility;Balance;Proper use of DME;Strengthening/ROM OT goals addressed during session: Proper use of Adaptive equipment and DME;Strengthening/ROM      AM-PAC PT "6 Clicks" Mobility   Outcome Measure  Help needed turning from your back to your side while in a flat bed without using bedrails?: A Lot   Help needed moving to and from a bed to a chair (including a wheelchair)?: A Little Help needed standing up from a chair using your arms (e.g., wheelchair or bedside chair)?: A Lot Help needed to walk in hospital room?: A Little Help needed climbing 3-5 steps with a railing? : Total 6 Click Score: 11     End of Session Equipment Utilized During Treatment: Gait belt Activity Tolerance: Patient tolerated treatment well;Patient limited by pain Patient left: with call bell/phone within reach;in chair;with family/visitor present Nurse Communication: Mobility status PT Visit Diagnosis: Unsteadiness on feet (R26.81);Other abnormalities of gait and mobility (R26.89);Muscle weakness (generalized) (M62.81);History of falling (Z91.81);Pain Pain - part of body:  (back)     Time: 9191-6606 PT Time Calculation (min) (ACUTE ONLY): 29 min  Charges:  $Therapeutic Activity: 8-22 mins                     Lyanne Co, DPT Acute Rehabilitation Services [phone]   [name] 07/02/2020, 12:20 PM

## 2020-07-03 LAB — BASIC METABOLIC PANEL
Anion gap: 10 (ref 5–15)
BUN: 17 mg/dL (ref 8–23)
CO2: 26 mmol/L (ref 22–32)
Calcium: 8 mg/dL — ABNORMAL LOW (ref 8.9–10.3)
Chloride: 100 mmol/L (ref 98–111)
Creatinine, Ser: 1.07 mg/dL (ref 0.61–1.24)
GFR, Estimated: >60 mL/min (ref >60)
Glucose, Bld: 112 mg/dL — ABNORMAL HIGH (ref 70–99)
Potassium: 3.5 mmol/L (ref 3.5–5.1)
Sodium: 136 mmol/L (ref 135–145)

## 2020-07-03 MED ORDER — FUROSEMIDE 40 MG PO TABS
40.0000 mg | ORAL_TABLET | Freq: Every day | ORAL | Status: DC
  Administered 2020-07-03 – 2020-07-08 (×6): 40 mg via ORAL
  Filled 2020-07-03 (×6): qty 1

## 2020-07-03 NOTE — Progress Notes (Signed)
[facility]  PROGRESS NOTE  [name] BMW:[id number] DOB: 11-17-1930 DOA: 06/30/2020 PCP: Dorothyann Peng, NP Admit date - 06/30/2020   Admitting Physician Desiree Hane, MD  Outpatient Primary MD for the patient is Dorothyann Peng, NP  LOS - 2 Brief Narrative  [name] is a 86 year old male with medical history significant for chronic diastolic CHF for which he is receiving home hospice care, CKD, history of CVA, HTN, HLD who presented on 11/12 after a fall with worsening shortness of breath, worsening swelling.  Increase generalized weakness, was admitted with working diagnosis of acute on chronic diastolic CHF exacerbation.  In ER, chest x-ray initially was concerning for with moderate-sized pericardial effusion, CT chest thorax showed large left pleural effusion with associated atelectasis as well as extensive upper abdominal ascites, cirrhosis.  Subjective  Feels back pain much better controlled on scheduled tylenol. Had a great night. No chest pain or dyspnea. Ate well last night  A & P   Acute on chronic diastolic CHF exacerbation,improving.  TTE 08/23/2018 shows preserved EF.  Patient has extensive 3+ pitting edema from feet to bilateral flanks, large left-sided pleural effusion, and dyspnea with minimal exertion despite continued use of oral diuresis prior to admission. Still significantly volume overloaded.  CT confirms cirrhosis with extensive mesenteric edema, large pleural effusion.  Dyspnea much improved, looks significantly less volume overloaded on exam.  Net -2.2 L since hospitalization  -Transition to oral Lasix 40 mg twice daily, to ensure continued clinical improvement over next 24 hours  Large left-sided pleural effusion.  Confirmed on CT chest.  Suspect most likely etiology is exacerbation of CHF.  Patient to continue medication management with diuresis -Transition to oral Lasix, no plans for thoracentesis -No Current O2 Requirements, No increased Respiratory  Effort  Cirrhosis mesenteric edema and ascites seen on CT chest. Also evidence of mesenteric edema and ascites likely related to longstanding hepatic congestion from advanced CHF.  No history of alcohol abuse per family. Denies abdominal pain --Transition to oral Lasix  Generalized Weakness with Recent Fall.  Highest risk factor likely being profound volume overload contributing to leg weakness.  Neurologically intact.  Patient was previously independent with walker, patient is unable to walk at home, his wife is unable to take care of him, will benefit from skilled nursing facility rehab -PT recommends SNF -Patient revoking exacerbated to allow initiation of medical benefit to pay for SNF -Awaiting placement per [facility] social worker  Acute lower Back pain after fall at home, pain better controlled.  Spinal x-rays negative for fracture.  Patient states pain is somewhat improved on pain regimen but doesn't last long and has more pain when initiating movement. Under hospice care would not want any surgical intervention if there is a fracture -scheduled tylenol to TID dosing,  oxycodone IR 5 mg q4 hrs PRN pain  HTN -continue home medication, Lopressor  CAD status post CABG, stable -No chest pain -Continue aspirin, Lopressor    Family Communication  :  Wife and daughter updated at bedside  Code Status :  DNR   Disposition Plan  :  Patient is from home under hospice care. Anticipated d/c date: 1 to 2 days. Barriers to d/c or necessity for inpatient status:  Monitor ability to manage volume or oral Lasix, likely dc in 24 hours  Consults  :  none  Procedures  :  none  DVT Prophylaxis  :  Lovenox  MDM: The below labs and imaging reports were reviewed and summarized above.  Medication management as above.  Lab Results  Component Value Date   PLT 129 (L) 07/01/2020    Diet :  Diet Order            DIET DYS 3 Room service appropriate? Yes with Assist; Fluid consistency: Thin  Diet  effective now                  Inpatient Medications Scheduled Meds: . acetaminophen  650 mg Oral TID   Or  . acetaminophen  650 mg Rectal TID  . amLODipine  10 mg Oral Daily  . aspirin EC  81 mg Oral Daily  . furosemide  40 mg Oral Daily  . metoprolol tartrate  25 mg Oral BID  . senna-docusate  1 tablet Oral BID   Continuous Infusions: PRN Meds:.ondansetron **OR** ondansetron (ZOFRAN) IV, oxyCODONE, polyvinyl alcohol  Antibiotics  :   Anti-infectives (From admission, onward)   None       Objective   Vitals:   07/03/20 0500 07/03/20 0545 07/03/20 0859 07/03/20 1142  BP:  114/65 117/65 (!) 87/50  Pulse:  67 74 60  Resp:  17  17  Temp:  98.4 F (36.9 C)  98.3 F (36.8 C)  TempSrc:  Oral  Oral  SpO2:  90% 94% 95%  Weight: 75.2 kg     Height:        SpO2: 95 %  Wt Readings from Last 3 Encounters:  07/03/20 75.2 kg  03/25/19 85.3 kg  03/05/19 86.2 kg     Intake/Output Summary (Last 24 hours) at 07/03/2020 1802 Last data filed at 07/03/2020 1759 Gross per 24 hour  Intake 340 ml  Output 1125 ml  Net -785 ml    Physical Exam: Awake Alert, Oriented X 3, Normal affect No new F.N deficits,  [place].AT, Normal respiratory effort on room air, . Decreased breath sounds at bases.  RRR,SEM appreciated, trace edema in lower extremities, 1+ pitting in bilateral thighs, much improved from prior exams.+ve B.Sounds, Abd Soft and distended with no tenderness, No rebound, guarding or rigidity. No Cyanosis, No new Rash or bruise     I have personally reviewed the following:   Data Reviewed:  CBC Recent Labs  Lab 06/30/20 1714 07/01/20 0455  WBC 8.3 4.8  HGB 13.5 11.5*  HCT 41.3 35.0*  PLT 167 129*  MCV 104.0* 104.8*  MCH 34.0 34.4*  MCHC 32.7 32.9  RDW 15.2 15.3  LYMPHSABS 2.2  --   MONOABS 0.8  --   EOSABS 0.2  --   BASOSABS 0.0  --     Chemistries  Recent Labs  Lab 06/30/20 1439 07/01/20 0455 07/02/20 0141 07/03/20 0851  NA 137 137 136 136  K  3.7 3.6 3.5 3.5  CL 103 103 101 100  CO2 23 26 27 26   GLUCOSE 108* 81 78 112*  BUN 15 14 14 17   CREATININE 1.17 1.08 1.05 1.07  CALCIUM 8.7* 8.3* 7.9* 8.0*  MG  --  1.9  --   --   AST 31  --   --   --   ALT 22  --   --   --   ALKPHOS 194*  --   --   --   BILITOT 1.1  --   --   --    ------------------------------------------------------------------------------------------------------------------ No results for input(s): CHOL, HDL, LDLCALC, TRIG, CHOLHDL, LDLDIRECT in the last 72 hours.  Lab Results  Component Value Date   HGBA1C 5.5 02/21/2017   ------------------------------------------------------------------------------------------------------------------  No results for input(s): TSH, T4TOTAL, T3FREE, THYROIDAB in the last 72 hours.  Invalid input(s): FREET3 ------------------------------------------------------------------------------------------------------------------ No results for input(s): VITAMINB12, FOLATE, FERRITIN, TIBC, IRON, RETICCTPCT in the last 72 hours.  Coagulation profile No results for input(s): INR, PROTIME in the last 168 hours.  No results for input(s): DDIMER in the last 72 hours.  Cardiac Enzymes No results for input(s): CKMB, TROPONINI, MYOGLOBIN in the last 168 hours.  Invalid input(s): CK ------------------------------------------------------------------------------------------------------------------    Component Value Date/Time   BNP 226.3 (H) 06/30/2020 1439    Micro Results Recent Results (from the past 240 hour(s))  Resp Panel by RT PCR (RSV, Flu A&B, Covid) - Nasopharyngeal Swab     Status: None   Collection Time: 06/30/20  2:53 PM   Specimen: Nasopharyngeal Swab  Result Value Ref Range Status   SARS Coronavirus 2 by RT PCR NEGATIVE NEGATIVE Final    Comment: (NOTE) SARS-CoV-2 target nucleic acids are NOT DETECTED.  The SARS-CoV-2 RNA is generally detectable in upper respiratoy specimens during the acute phase of infection. The  lowest concentration of SARS-CoV-2 viral copies this assay can detect is 131 copies/mL. A negative result does not preclude SARS-Cov-2 infection and should not be used as the sole basis for treatment or other patient management decisions. A negative result may occur with  improper specimen collection/handling, submission of specimen other than nasopharyngeal swab, presence of viral mutation(s) within the areas targeted by this assay, and inadequate number of viral copies (<131 copies/mL). A negative result must be combined with clinical observations, patient history, and epidemiological information. The expected result is Negative.  Fact Sheet for Patients:  PinkCheek.be  Fact Sheet for Healthcare Providers:  GravelBags.it  This test is no t yet approved or cleared by the [place] FDA and  has been authorized for detection and/or diagnosis of SARS-CoV-2 by FDA under an Emergency Use Authorization (EUA). This EUA will remain  in effect (meaning this test can be used) for the duration of the COVID-19 declaration under Section 564(b)(1) of the Act, 21 U.S.C. section 360bbb-3(b)(1), unless the authorization is terminated or revoked sooner.     Influenza A by PCR NEGATIVE NEGATIVE Final   Influenza B by PCR NEGATIVE NEGATIVE Final    Comment: (NOTE) The Xpert Xpress SARS-CoV-2/FLU/RSV assay is intended as an aid in  the diagnosis of influenza from Nasopharyngeal swab specimens and  should not be used as a sole basis for treatment. Nasal washings and  aspirates are unacceptable for Xpert Xpress SARS-CoV-2/FLU/RSV  testing.  Fact Sheet for Patients: PinkCheek.be  Fact Sheet for Healthcare Providers: GravelBags.it  This test is not yet approved or cleared by the [place] FDA and  has been authorized for detection and/or diagnosis of SARS-CoV-2 by  FDA under  an Emergency Use Authorization (EUA). This EUA will remain  in effect (meaning this test can be used) for the duration of the  Covid-19 declaration under Section 564(b)(1) of the Act, 21  U.S.C. section 360bbb-3(b)(1), unless the authorization is  terminated or revoked.    Respiratory Syncytial Virus by PCR NEGATIVE NEGATIVE Final    Comment: (NOTE) Fact Sheet for Patients: PinkCheek.be  Fact Sheet for Healthcare Providers: GravelBags.it  This test is not yet approved or cleared by the [place] FDA and  has been authorized for detection and/or diagnosis of SARS-CoV-2 by  FDA under an Emergency Use Authorization (EUA). This EUA will remain  in effect (meaning this test can be used) for the duration of the  COVID-19 declaration under Section 564(b)(1) of the Act, 21 U.S.C.  section 360bbb-3(b)(1), unless the authorization is terminated or  revoked. Performed at [facility], [place] [address] [place], [place] [address]     Radiology Reports DG Chest 2 View  Result Date: 06/30/2020 CLINICAL DATA:  Back pain after fall. EXAM: CHEST - 2 VIEW COMPARISON:  April 14, 2019. FINDINGS: Stable cardiomediastinal silhouette. Status post coronary bypass graft. Stable moderate left pleural effusion is noted with probable underlying atelectasis. Possible small right apical pneumothorax is noted. Bony thorax is unremarkable. IMPRESSION: Possible small right apical pneumothorax is noted; CT scan of the chest is recommended for further evaluation. Stable moderate left pleural effusion with probable underlying atelectasis. Electronically Signed   By: Marijo Conception M.D.   On: 06/30/2020 14:35   DG Thoracic Spine 2 View  Result Date: 06/30/2020 CLINICAL DATA:  Back pain after fall yesterday. EXAM: THORACIC SPINE 2 VIEWS COMPARISON:  None. FINDINGS: No fracture or spondylolisthesis is noted. Anterior osteophyte formation is noted at  multiple levels in the lower thoracic spine. IMPRESSION: Mild multilevel degenerative changes. No acute abnormality seen in the thoracic spine. Electronically Signed   By: Marijo Conception M.D.   On: 06/30/2020 14:32   DG Lumbar Spine Complete  Result Date: 06/30/2020 CLINICAL DATA:  Low back pain after fall yesterday. EXAM: LUMBAR SPINE - COMPLETE 4+ VIEW COMPARISON:  None. FINDINGS: No fracture or significant spondylolisthesis is noted. Moderate degenerative disc disease is noted at L3-4. Mild degenerative disc disease is noted at L2-3 and L4-5. IMPRESSION: Multilevel degenerative disc disease. No acute abnormality is noted. Aortic Atherosclerosis (ICD10-I70.0). Electronically Signed   By: Marijo Conception M.D.   On: 06/30/2020 14:31   DG Pelvis 1-2 Views  Result Date: 06/30/2020 CLINICAL DATA:  Fall with right leg pain EXAM: PELVIS - 1-2 VIEW COMPARISON:  12/16/2016 FINDINGS: SI joints are non widened. Status post bilateral hip replacements with normal alignment. No definitive fracture is seen. Pubic symphysis is intact. There are extensive vascular calcifications. IMPRESSION: Status post bilateral hip replacements without acute osseous abnormality. Electronically Signed   By: Donavan Foil M.D.   On: 06/30/2020 21:34   CT Chest Wo Contrast  Result Date: 06/30/2020 CLINICAL DATA:  Back pain, fall. EXAM: CT CHEST WITHOUT CONTRAST TECHNIQUE: Multidetector CT imaging of the chest was performed following the standard protocol without IV contrast. COMPARISON:  Chest radiograph 07/01/2019 and chest CT from 12/07/2008 FINDINGS: Cardiovascular: Coronary, aortic arch, and branch vessel atherosclerotic vascular disease. Prior CABG. Mediastinum/Nodes: No pathologic adenopathy identified. Anterior to one of the bypass grafts, there is a small fluid density in the mediastinum measuring 2.3 by 0.9 cm on image 71 of series 3 which was not present on 12/07/2008, significance uncertain. This could be a low-density  lymph node. Lungs/Pleura: Large left and small right pleural effusions with associated passive atelectasis. The left pleural effusion occupies 75% of left hemithoracic volume. Biapical pleuroparenchymal scarring in the lungs. There is no pneumothorax. The appearance on recent chest radiography is due to skin folds. Upper Abdomen: Cirrhosis. Extensive upper abdominal ascites. Cystic lesions with faint marginal calcifications in the dome of the right and left hepatic lobe. Extensive edema in the upper abdominal mesentery and omentum. Aortoiliac atherosclerotic vascular disease. Prominently diminished right renal size compatible with atrophy. Hypodense lesion along the left renal pelvis, probably a peripelvic cyst. Other hypodense lesions of the left kidney are probably cysts but technically nonspecific. Musculoskeletal: Degenerative glenohumeral arthropathy bilaterally. Prominent  degenerative sternoclavicular arthropathy bilaterally. Lower cervical spondylosis and thoracic spondylosis. No well-defined fracture. IMPRESSION: 1. No well-defined fracture is identified.  No pneumothorax. 2. Large left and small right pleural effusions with associated passive atelectasis. 3. Extensive upper abdominal ascites. Extensive edema in the upper abdominal mesentery and omentum. 4. Cirrhosis. 5. Coronary, aortic arch, and branch vessel atherosclerotic vascular disease. Prior CABG. 6. Prominently diminished right renal size compatible with atrophy. 7. Hypodense lesions of the left kidney are probably cysts but technically nonspecific. 8. Small fluid density in the mediastinum anterior to one of the bypass grafts, significance uncertain. Possibly a small low-density lymph node. 9. Degenerative glenohumeral arthropathy bilaterally. Prominent degenerative sternoclavicular arthropathy bilaterally. 10. Aortic atherosclerosis. Aortic Atherosclerosis (ICD10-I70.0). Electronically Signed   By: Van Clines M.D.   On: 06/30/2020 18:57     DG Femur Min 2 Views Right  Result Date: 06/30/2020 CLINICAL DATA:  Fall, right leg pain, EXAM: RIGHT FEMUR 2 VIEWS COMPARISON:  None. FINDINGS: Two view radiograph right femur demonstrates surgical changes of right total hip arthroplasty. Arthroplasty components are in anatomic alignment. No fracture or dislocation. No lytic or blastic bone lesion. Advanced vascular calcifications are seen within the medial soft tissues. IMPRESSION: No acute fracture or dislocation. Electronically Signed   By: Fidela Salisbury MD   On: 06/30/2020 21:44     Time Spent in minutes  30     Desiree Hane M.D on 07/03/2020 at 6:02 PM  To page go to [email or web address] - password [facility]

## 2020-07-03 NOTE — Progress Notes (Signed)
  Speech Language Pathology Treatment: Dysphagia  Patient Details Name: Bernard Byrd MRN: [id number] DOB: 01/24/1931 Today's Date: 07/03/2020 Time: 0900-0910 SLP Time Calculation (min) (ACUTE ONLY): 10 min  Assessment / Plan / Recommendation Clinical Impression  F/u after 11/13 swallow assessment.  Wife at bedside.  Pt observed with thin liquids, with no overt s/s of aspiration.  He reports using basic strategies to maintain safety and limit opportunities for choking.  He self-monitors independently. When asked if he wants to advance diet back to regular, to allow greater choices, he states he would rather stay on a dysphagia 3.  Encouraged him to ask for diet advancement when he feels he is ready once he reaches the SNF.  He does not need further SLP assessment to do so.  No further needs - managing POs well. D/W pt, wife, who agree. Will sign off.   HPI HPI: 86 y.o male presenting s/p fall at home. Imaging neg for acute abnormality. PMH includes chronic diastolic CHF, CAD s/p CABG, history of CVA, HTN, HLD, and currently active with home hospice.       SLP Plan  All goals met       Recommendations  Diet recommendations: Dysphagia 3 (mechanical soft);Thin liquid Liquids provided via: Cup;Straw Medication Administration: Whole meds with liquid Supervision: Patient able to self feed Compensations: Slow rate;Small sips/bites                Oral Care Recommendations: Oral care BID SLP Visit Diagnosis: Dysphagia, unspecified (R13.10) Plan: All goals met       GO              Bernard Byrd, Bernard Byrd CCC/SLP Acute Rehabilitation Services Office number [phone] Pager [phone]   Bernard Byrd 07/03/2020, 9:11 AM

## 2020-07-03 NOTE — TOC Progression Note (Addendum)
Transition of Care [facility]) - Progression Note    Patient Details  Name: ANICETO KYSER MRN: [id number] Date of Birth: [birth year]  Transition of [facility]) CM/SW [place], [place] Phone Number: 07/03/2020, 12:16 PM  Clinical Narrative:    Update: [facility] has no beds available. [facility] has no beds available.  Update: [facility] has no beds available. CSW waiting to hear back from [facility].  CSW spoke with pt's daughter [name] in reference to SNF placement for pt. [name] stated that they are looking for short term placement for pt. [name] stated she would like the best for her father and inquired specifically for [facility]. CSW adv that most four and five star facilities are usually full, but CSW would check. CSW reached out to [facility] and [place] and requested the referral be reviewed. Pt has received both covid vaccines. Full SNF workup and fax out done.        Expected Discharge Plan and Services                                                 Social Determinants of Health (SDOH) Interventions    Readmission Risk Interventions No flowsheet data found.

## 2020-07-03 NOTE — NC FL2 (Signed)
[place] LEVEL OF CARE SCREENING TOOL     IDENTIFICATION  Patient Name: Bernard Byrd Birthdate: 1930-09-28 Sex: male Admission Date (Current Location): 06/30/2020  [facility] and [place] Number:  Herbalist and Address:  [facility], [place] [address] [place], [place] [address]      Provider Number: [id number]  Attending Physician Name and Address:  Desiree Hane, MD  Relative Name and Phone Number:  Mando Blatz [phone]    Current Level of Care: Hospital Recommended Level of Care: [place] Prior Approval Number:    Date Approved/Denied:   PASRR Number: [id number]  Discharge Plan: SNF    Current Diagnoses: Patient Active Problem List   Diagnosis Date Noted   ' 07/01/2020   Other cirrhosis of liver ([place]) 07/01/2020   Generalized weakness 07/01/2020   Back pain 07/01/2020   Acute on chronic diastolic CHF (congestive heart failure) ([place]) 06/30/2020   Pleural effusion due to CHF (congestive heart failure) ([place]) 06/30/2020   Fall at home, initial encounter 06/30/2020   Encounter for Medicare annual wellness exam 06/11/2016   Chest pain 07/19/2015   H/O total hip arthroplasty 12/12/2014   Kidney stone 47/04/6282   Renal colic 66/29/4765   Nephrolithiasis 08/07/2014   Hepatic steatosis 08/07/2014   Cholelithiasis 08/07/2014   CAD (coronary artery disease) 09/15/2013   Cervical spine pain 09/11/2012   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   Health maintenance examination 09/14/2011   Hyperlipidemia 09/14/2011   HTN (hypertension), benign 09/14/2011   Macrocytosis without anemia 09/14/2011   DYSPNEA 07/26/2009   LEG PAIN, LEFT 03/16/2009   OSTEOARTHRITIS, HIP, RIGHT 12/02/2008    Orientation RESPIRATION BLADDER Height & Weight     Self, Time, Situation, Place  Normal Incontinent, External catheter Weight: 165 lb 12.6 oz (75.2 kg) Height:  5\' 11"  (180.3 cm)  BEHAVIORAL  SYMPTOMS/MOOD NEUROLOGICAL BOWEL NUTRITION STATUS      Continent Diet  AMBULATORY STATUS COMMUNICATION OF NEEDS Skin   Extensive Assist Verbally Other (Comment) (Pressure Injury 06/30/20 sacrum medial stage 2 3 cm in length)                       Personal Care Assistance Level of Assistance  Bathing, Feeding, Dressing Bathing Assistance: Maximum assistance Feeding assistance: Limited assistance Dressing Assistance: Maximum assistance     Functional Limitations Info  Sight, Hearing, Speech Sight Info: Adequate Hearing Info: Adequate Speech Info: Adequate    SPECIAL CARE FACTORS FREQUENCY  PT (By licensed PT), OT (By licensed OT)     PT Frequency: 5x week OT Frequency: 5x week            Contractures Contractures Info: Not present    Additional Factors Info  Code Status, Allergies Code Status Info: DNR Allergies Info: Clindamycin Palmitate Hcl Clindamycin/lincomycin Penicillins Sulfa Antibiotics Other Sulfonamide Derivatives           Current Medications (07/03/2020):  This is the current hospital active medication list Current Facility-Administered Medications  Medication Dose Route Frequency Provider Last Rate Last Admin   acetaminophen (TYLENOL) tablet 650 mg  650 mg Oral TID Oretha Milch D, MD   650 mg at 07/03/20 4650   Or   acetaminophen (TYLENOL) suppository 650 mg  650 mg Rectal TID Oretha Milch D, MD       amLODipine (NORVASC) tablet 10 mg  10 mg Oral Daily Lenore Cordia, MD   10 mg at 07/03/20 0859   aspirin  EC tablet 81 mg  81 mg Oral Daily Zada Finders R, MD   81 mg at 07/03/20 0859   furosemide (LASIX) tablet 40 mg  40 mg Oral Daily Oretha Milch D, MD       metoprolol tartrate (LOPRESSOR) tablet 25 mg  25 mg Oral BID Oretha Milch D, MD   25 mg at 07/03/20 0859   ondansetron (ZOFRAN) tablet 4 mg  4 mg Oral Q6H PRN Lenore Cordia, MD       Or   ondansetron (ZOFRAN) injection 4 mg  4 mg Intravenous Q6H PRN Lenore Cordia, MD        oxyCODONE (Oxy IR/ROXICODONE) immediate release tablet 5 mg  5 mg Oral Q4H PRN Oretha Milch D, MD   5 mg at 07/03/20 2683   polyvinyl alcohol (LIQUIFILM TEARS) 1.4 % ophthalmic solution 1 drop  1 drop Both Eyes Daily PRN Lenore Cordia, MD       senna-docusate (Senokot-S) tablet 1 tablet  1 tablet Oral BID Lenore Cordia, MD   1 tablet at 07/03/20 4196     Discharge Medications: Please see discharge summary for a list of discharge medications.  Relevant Imaging Results:  Relevant Lab Results:   Additional Information SSN [id number]   Outpatient Palliative Services  [address], [place]

## 2020-07-03 NOTE — Progress Notes (Signed)
[name] 5I43 [facility]) Hospitalized Hospice Patient   [name] is under hospice services with a terminal diagnosis of hypertensive heart disease, admitted to hospice services on 02/08/20. In the past weeks pt has been having increased BLE edema while having decreased appetite and weight loss of 5-6 lbs. Pt fell on 06/29/20, falling backwards and hitting his mid-back on nightstand. Pt unable to stand after fall, EMS was activated and pt was transported to ED for further evaluation. Pt was admitted to [facility] for acute on chronic heart failure. Per Dr. Karie Georges with [facility], this is a related hospital admission.  Visited at bedside with wife present. Patient was sitting bed, stated he had lunch. Alert and oriented, not complaints of pain. Swallow evaluation is the can return to regular diet but patient prefers to stay on dysphagia 3 diet at this time.   VS: 98.3, 87/50, 63, 17, 96%RA Labs: Glucose 112, Calcium 8.0 No new imaging:  IV/PRN medications:  No IV meds. Oxycodone 5mg  PRN q4hrs @ [id number]  Problem List: - acute on chronic HF exacerbation - continue IV lasix, on room air, marked dyspnea with exertion  - acute lower back pain s/p fall - scheduled tylenol, oxycodone IR q4 as needed  GOC: clear, conservative management  D/C Planning: SNF. Pt cannot return to his home unable to walk, his wife is unable to care for him. He was previously independent with a walker. Family requests that they revoke his hospice benefit to allow his traditional medicare benefit to pay for SNF. Awaiting placement.  Family: present in room IDT: hospice team updated  Farrel Gordon, RN, [place] (listed on [place] under [place])     [phone]

## 2020-07-04 LAB — BASIC METABOLIC PANEL
Anion gap: 10 (ref 5–15)
BUN: 14 mg/dL (ref 8–23)
CO2: 25 mmol/L (ref 22–32)
Calcium: 8.3 mg/dL — ABNORMAL LOW (ref 8.9–10.3)
Chloride: 99 mmol/L (ref 98–111)
Creatinine, Ser: 1.18 mg/dL (ref 0.61–1.24)
GFR, Estimated: 60 mL/min — ABNORMAL LOW (ref >60)
Glucose, Bld: 87 mg/dL (ref 70–99)
Potassium: 3.6 mmol/L (ref 3.5–5.1)
Sodium: 134 mmol/L — ABNORMAL LOW (ref 135–145)

## 2020-07-04 MED ORDER — METOPROLOL TARTRATE 12.5 MG HALF TABLET
12.5000 mg | ORAL_TABLET | Freq: Two times a day (BID) | ORAL | Status: DC
  Administered 2020-07-04 – 2020-07-08 (×9): 12.5 mg via ORAL
  Filled 2020-07-04 (×9): qty 1

## 2020-07-04 MED ORDER — POLYETHYLENE GLYCOL 3350 17 G PO PACK
17.0000 g | PACK | Freq: Every day | ORAL | Status: DC
  Administered 2020-07-04 – 2020-07-08 (×4): 17 g via ORAL
  Filled 2020-07-04 (×5): qty 1

## 2020-07-04 MED ORDER — POLYETHYLENE GLYCOL 3350 17 G PO PACK: 17.0000 g | PACK | Freq: Every day | ORAL | Status: DC

## 2020-07-04 NOTE — Progress Notes (Signed)
Physical Therapy Treatment Patient Details Name: Bernard Byrd MRN: [id number] DOB: 06-May-1931 Today's Date: 07/04/2020    History of Present Illness 86 y.o male presenting s/p fall at home. Imaging neg for acute abnormality. PMH includes chronic diastolic CHF, CAD s/p CABG, history of CVA, HTN, HLD, and currently active with home hospice    PT Comments    Pt agreeable to participate with encouragement. Requiring up to moderate assist for bed mobility and transfers; ambulating 6 ft, then 4 ft with a walker, minimal assist and chair follow. Displays decreased endurance, weakness, balance deficits and fear of falling. Continue to recommend SNF for ongoing Physical Therapy.     Follow Up Recommendations  SNF     Equipment Recommendations  Other (comment) (defer)    Recommendations for Other Services       Precautions / Restrictions Precautions Precautions: Fall Restrictions Weight Bearing Restrictions: No    Mobility  Bed Mobility Overal bed mobility: Needs Assistance Bed Mobility: Supine to Sit;Sit to Supine     Supine to sit: Mod assist Sit to supine: Mod assist   General bed mobility comments: ModA for trunk to upright, use of bed pad to scoot hips forward. ModA for BLE negotiation back into bed  Transfers Overall transfer level: Needs assistance Equipment used: Rolling walker (2 wheeled) Transfers: Sit to/from Stand Sit to Stand: Min assist;+2 physical assistance;+2 safety/equipment;Mod assist         General transfer comment: Min-modA + 2 to rise from edge of bed and recliner  Ambulation/Gait Ambulation/Gait assistance: Min assist;+2 safety/equipment Gait Distance (Feet): 10 Feet (6", 4") Assistive device: Rolling walker (2 wheeled) Gait Pattern/deviations: Step-to pattern;Narrow base of support;Trunk flexed;Decreased stride length Gait velocity: decreased Gait velocity interpretation: <1.8 ft/sec, indicate of risk for recurrent falls General Gait  Details: Pt ambulating 6 ft, then 4 ft with seated rest break, minA for balance, chair follow utilized. Cues for stepping initiation and walker proximity   Stairs             Wheelchair Mobility    Modified Rankin (Stroke Patients Only)       Balance Overall balance assessment: Needs assistance Sitting-balance support: Feet supported Sitting balance-Leahy Scale: Fair     Standing balance support: Bilateral upper extremity supported;During functional activity Standing balance-Leahy Scale: Poor                              Cognition Arousal/Alertness: Awake/alert Behavior During Therapy: Anxious Overall Cognitive Status: Impaired/Different from baseline Area of Impairment: Safety/judgement;Memory                     Memory: Decreased short-term memory   Safety/Judgement: Decreased awareness of deficits            Exercises      General Comments        Pertinent Vitals/Pain Pain Assessment: Faces Faces Pain Scale: Hurts even more Pain Location: back with bed mobility Pain Descriptors / Indicators: Aching;Grimacing;Guarding Pain Intervention(s): Limited activity within patient's tolerance;Monitored during session    Home Living                      Prior Function            PT Goals (current goals can now be found in the care plan section) Acute Rehab PT Goals Patient Stated Goal: be able to go to and from bathroom Potential to Achieve Goals:  Fair    Frequency    Min 2X/week      PT Plan Frequency needs to be updated    Co-evaluation              AM-PAC PT "6 Clicks" Mobility   Outcome Measure  Help needed turning from your back to your side while in a flat bed without using bedrails?: A Lot Help needed moving from lying on your back to sitting on the side of a flat bed without using bedrails?: A Lot Help needed moving to and from a bed to a chair (including a wheelchair)?: A Little Help needed  standing up from a chair using your arms (e.g., wheelchair or bedside chair)?: A Lot Help needed to walk in hospital room?: A Little Help needed climbing 3-5 steps with a railing? : Total 6 Click Score: 13    End of Session Equipment Utilized During Treatment: Gait belt Activity Tolerance: Patient tolerated treatment well;Patient limited by pain Patient left: with call bell/phone within reach;in bed;with bed alarm set;with family/visitor present Nurse Communication: Mobility status PT Visit Diagnosis: Unsteadiness on feet (R26.81);Other abnormalities of gait and mobility (R26.89);Muscle weakness (generalized) (M62.81);History of falling (Z91.81);Pain     Time: 9628-3662 PT Time Calculation (min) (ACUTE ONLY): 24 min  Charges:  $Gait Training: 8-22 mins $Therapeutic Activity: 8-22 mins                     Bernard Byrd, PT, DPT Acute Rehabilitation Services Pager [phone] Office [phone]    Deno Etienne 07/04/2020, 12:39 PM

## 2020-07-04 NOTE — TOC Progression Note (Signed)
Transition of Care [facility]) - Progression Note    Patient Details  Name: Bernard Byrd MRN: [id number] Date of Birth: 1930-11-09  Transition of Care [facility]) CM/SW [place], [place] Phone Number: 07/04/2020, 2:35 PM  Clinical Narrative:    CSW spoke with [name], they can take pt when medically stable and will start auth today. Pt's daughter [name] made aware.    Expected Discharge Plan: [place] Barriers to Discharge: Continued Medical Work up, Ship broker  Expected Discharge Plan and Services Expected Discharge Plan: [place] In-house Referral: Clinical Social Work     Living arrangements for the past 2 months: Single Family Home                                       Social Determinants of Health (SDOH) Interventions    Readmission Risk Interventions No flowsheet data found.

## 2020-07-04 NOTE — Progress Notes (Signed)
[facility]  PROGRESS NOTE  Bernard Byrd EXH:[id number] DOB: [birth year] DOA: 06/30/2020 PCP: Dorothyann Peng, NP Admit date - 06/30/2020   Admitting Physician Desiree Hane, MD  Outpatient Primary MD for the patient is Dorothyann Peng, NP  LOS - 3 Brief Narrative  Bernard Byrd is a 86 year old male with medical history significant for chronic diastolic CHF for which he is receiving home hospice care, CKD, history of CVA, HTN, HLD who presented on 11/12 after a fall with worsening shortness of breath, worsening swelling.  Increase generalized weakness, was admitted with working diagnosis of acute on chronic diastolic CHF exacerbation.  In ER, chest x-ray initially was concerning for with moderate-sized pericardial effusion, CT chest thorax showed large left pleural effusion with associated atelectasis as well as extensive upper abdominal ascites, cirrhosis.  Subjective  Back pain a little more noticeable this morning, just recently received his oral pain medication.  Continues to note improvement in shortness of breath, no chest pain  A & P   Acute on chronic diastolic CHF exacerbation,improving.  TTE 08/23/2018 shows preserved EF.  Patient has extensive 3+ pitting edema from feet to bilateral flanks, large left-sided pleural effusion, and dyspnea with minimal exertion despite continued use of oral diuresis prior to admission. Still significantly volume overloaded.  CT confirms cirrhosis with extensive mesenteric edema, large pleural effusion.  Dyspnea much improved, looks significantly less volume overloaded on exam.  Net -2.5 L since hospitalization  -Continue oral Lasix 40 mg twice daily  Large left-sided pleural effusion.  Confirmed on CT chest.  Suspect most likely etiology is exacerbation of CHF.  Patient to continue medication management with diuresis -oral Lasix, no plans for thoracentesis -No Current O2 Requirements, No increased Respiratory Effort  Cirrhosis mesenteric edema and  ascites seen on CT chest. Also evidence of mesenteric edema and ascites likely related to longstanding hepatic congestion from advanced CHF.  No history of alcohol abuse per family. Denies abdominal pain --oral Lasix  Generalized Weakness with Recent Fall.  Highest risk factor likely being profound volume overload contributing to leg weakness.  Neurologically intact.  Patient was previously independent with walker, patient is unable to walk at home, his wife is unable to take care of him, will benefit from skilled nursing facility rehab -PT recommends SNF -Patient revoking exacerbated to allow initiation of medical benefit to pay for SNF -Awaiting placement per [facility] social worker  Acute lower Back pain after fall at home, pain better controlled.  Spinal x-rays negative for fracture.  Patient states pain is somewhat improved on pain regimen but doesn't last long and has more pain when initiating movement. Under hospice care would not want any surgical intervention if there is a fracture -scheduled tylenol to 650 mg TID dosing (no further titration given (),  oxycodone IR 5 mg q4 hrs PRN pain -Optimize bowel regimen with scheduled MiraLAX   HTN.  Has hypertension at home however as BPs have been in the mid 90s to low 100s here. -decrease home lopressor to 12.5mg  BID -Continue home amlodipine 10 mg  and closely monitor  CAD status post CABG, stable -No chest pain -Continue aspirin, Lopressor    Family Communication  :  Wife  updated at bedside on 11/16 Code Status :  DNR   Disposition Plan  :  Patient is from home under hospice care. Anticipated d/c date: medically ready for discharge. Barriers to d/c or necessity for inpatient status:  Tolerating oral Lasix, Education officer, museum assisting with SNF placement consults  :  none  Procedures  :  none  DVT Prophylaxis  :  Lovenox  MDM: The below labs and imaging reports were reviewed and summarized above.  Medication management as above.  Lab  Results  Component Value Date   PLT 129 (L) 07/01/2020    Diet :  Diet Order            DIET DYS 3 Room service appropriate? Yes with Assist; Fluid consistency: Thin  Diet effective now                  Inpatient Medications Scheduled Meds: . acetaminophen  650 mg Oral TID  . amLODipine  10 mg Oral Daily  . aspirin EC  81 mg Oral Daily  . furosemide  40 mg Oral Daily  . metoprolol tartrate  12.5 mg Oral BID  . polyethylene glycol  17 g Oral Daily  . senna-docusate  1 tablet Oral BID   Continuous Infusions: PRN Meds:.ondansetron **OR** ondansetron (ZOFRAN) IV, oxyCODONE, polyvinyl alcohol  Antibiotics  :   Anti-infectives (From admission, onward)   None       Objective   Vitals:   07/03/20 1142 07/03/20 2116 07/04/20 0432 07/04/20 1115  BP: (!) 87/50 (!) 96/57 97/60 (!) 121/56  Pulse: 60 70 75 77  Resp: 17 20 18 18   Temp: 98.3 F (36.8 C) 98.7 F (37.1 C) 98.4 F (36.9 C) 98.1 F (36.7 C)  TempSrc: Oral Oral Oral Oral  SpO2: 95% 91% 91% 94%  Weight:   75.8 kg   Height:        SpO2: 94 %  Wt Readings from Last 3 Encounters:  07/04/20 75.8 kg  03/25/19 85.3 kg  03/05/19 86.2 kg     Intake/Output Summary (Last 24 hours) at 07/04/2020 1237 Last data filed at 07/04/2020 0817 Gross per 24 hour  Intake 680 ml  Output 1025 ml  Net -345 ml    Physical Exam: Awake Alert, Oriented X 3, Normal affect No new F.N deficits,  [place].AT, Normal respiratory effort on room air, . Decreased breath sounds at bases.  RRR,SEM appreciated, trace edema in lower extremities, trace pitting edema bilateral lower extremities, much improved from prior exams.+ve B.Sounds, Abd Soft and distended with no tenderness, No rebound, guarding or rigidity. No Cyanosis, No new Rash or bruise     I have personally reviewed the following:   Data Reviewed:  CBC Recent Labs  Lab 06/30/20 1714 07/01/20 0455  WBC 8.3 4.8  HGB 13.5 11.5*  HCT 41.3 35.0*  PLT 167 129*  MCV  104.0* 104.8*  MCH 34.0 34.4*  MCHC 32.7 32.9  RDW 15.2 15.3  LYMPHSABS 2.2  --   MONOABS 0.8  --   EOSABS 0.2  --   BASOSABS 0.0  --     Chemistries  Recent Labs  Lab 06/30/20 1439 07/01/20 0455 07/02/20 0141 07/03/20 0851 07/04/20 0451  NA 137 137 136 136 134*  K 3.7 3.6 3.5 3.5 3.6  CL 103 103 101 100 99  CO2 23 26 27 26 25   GLUCOSE 108* 81 78 112* 87  BUN 15 14 14 17 14   CREATININE 1.17 1.08 1.05 1.07 1.18  CALCIUM 8.7* 8.3* 7.9* 8.0* 8.3*  MG  --  1.9  --   --   --   AST 31  --   --   --   --   ALT 22  --   --   --   --  ALKPHOS 194*  --   --   --   --   BILITOT 1.1  --   --   --   --    ------------------------------------------------------------------------------------------------------------------ No results for input(s): CHOL, HDL, LDLCALC, TRIG, CHOLHDL, LDLDIRECT in the last 72 hours.  Lab Results  Component Value Date   HGBA1C 5.5 02/21/2017   ------------------------------------------------------------------------------------------------------------------ No results for input(s): TSH, T4TOTAL, T3FREE, THYROIDAB in the last 72 hours.  Invalid input(s): FREET3 ------------------------------------------------------------------------------------------------------------------ No results for input(s): VITAMINB12, FOLATE, FERRITIN, TIBC, IRON, RETICCTPCT in the last 72 hours.  Coagulation profile No results for input(s): INR, PROTIME in the last 168 hours.  No results for input(s): DDIMER in the last 72 hours.  Cardiac Enzymes No results for input(s): CKMB, TROPONINI, MYOGLOBIN in the last 168 hours.  Invalid input(s): CK ------------------------------------------------------------------------------------------------------------------    Component Value Date/Time   BNP 226.3 (H) 06/30/2020 1439    Micro Results Recent Results (from the past 240 hour(s))  Resp Panel by RT PCR (RSV, Flu A&B, Covid) - Nasopharyngeal Swab     Status: None    Collection Time: 06/30/20  2:53 PM   Specimen: Nasopharyngeal Swab  Result Value Ref Range Status   SARS Coronavirus 2 by RT PCR NEGATIVE NEGATIVE Final    Comment: (NOTE) SARS-CoV-2 target nucleic acids are NOT DETECTED.  The SARS-CoV-2 RNA is generally detectable in upper respiratoy specimens during the acute phase of infection. The lowest concentration of SARS-CoV-2 viral copies this assay can detect is 131 copies/mL. A negative result does not preclude SARS-Cov-2 infection and should not be used as the sole basis for treatment or other patient management decisions. A negative result may occur with  improper specimen collection/handling, submission of specimen other than nasopharyngeal swab, presence of viral mutation(s) within the areas targeted by this assay, and inadequate number of viral copies (<131 copies/mL). A negative result must be combined with clinical observations, patient history, and epidemiological information. The expected result is Negative.  Fact Sheet for Patients:  PinkCheek.be  Fact Sheet for Healthcare Providers:  GravelBags.it  This test is no t yet approved or cleared by the [place] FDA and  has been authorized for detection and/or diagnosis of SARS-CoV-2 by FDA under an Emergency Use Authorization (EUA). This EUA will remain  in effect (meaning this test can be used) for the duration of the COVID-19 declaration under Section 564(b)(1) of the Act, 21 U.S.C. section 360bbb-3(b)(1), unless the authorization is terminated or revoked sooner.     Influenza A by PCR NEGATIVE NEGATIVE Final   Influenza B by PCR NEGATIVE NEGATIVE Final    Comment: (NOTE) The Xpert Xpress SARS-CoV-2/FLU/RSV assay is intended as an aid in  the diagnosis of influenza from Nasopharyngeal swab specimens and  should not be used as a sole basis for treatment. Nasal washings and  aspirates are unacceptable for Xpert  Xpress SARS-CoV-2/FLU/RSV  testing.  Fact Sheet for Patients: PinkCheek.be  Fact Sheet for Healthcare Providers: GravelBags.it  This test is not yet approved or cleared by the [place] FDA and  has been authorized for detection and/or diagnosis of SARS-CoV-2 by  FDA under an Emergency Use Authorization (EUA). This EUA will remain  in effect (meaning this test can be used) for the duration of the  Covid-19 declaration under Section 564(b)(1) of the Act, 21  U.S.C. section 360bbb-3(b)(1), unless the authorization is  terminated or revoked.    Respiratory Syncytial Virus by PCR NEGATIVE NEGATIVE Final    Comment: (NOTE) Fact Sheet  for Patients: PinkCheek.be  Fact Sheet for Healthcare Providers: GravelBags.it  This test is not yet approved or cleared by the [place] FDA and  has been authorized for detection and/or diagnosis of SARS-CoV-2 by  FDA under an Emergency Use Authorization (EUA). This EUA will remain  in effect (meaning this test can be used) for the duration of the  COVID-19 declaration under Section 564(b)(1) of the Act, 21 U.S.C.  section 360bbb-3(b)(1), unless the authorization is terminated or  revoked. Performed at [facility], [place] [address], [place], [place] [address]     Radiology Reports DG Chest 2 View  Result Date: 06/30/2020 CLINICAL DATA:  Back pain after fall. EXAM: CHEST - 2 VIEW COMPARISON:  April 14, 2019. FINDINGS: Stable cardiomediastinal silhouette. Status post coronary bypass graft. Stable moderate left pleural effusion is noted with probable underlying atelectasis. Possible small right apical pneumothorax is noted. Bony thorax is unremarkable. IMPRESSION: Possible small right apical pneumothorax is noted; CT scan of the chest is recommended for further evaluation. Stable moderate left pleural effusion with probable  underlying atelectasis. Electronically Signed   By: Marijo Conception M.D.   On: 06/30/2020 14:35   DG Thoracic Spine 2 View  Result Date: 06/30/2020 CLINICAL DATA:  Back pain after fall yesterday. EXAM: THORACIC SPINE 2 VIEWS COMPARISON:  None. FINDINGS: No fracture or spondylolisthesis is noted. Anterior osteophyte formation is noted at multiple levels in the lower thoracic spine. IMPRESSION: Mild multilevel degenerative changes. No acute abnormality seen in the thoracic spine. Electronically Signed   By: Marijo Conception M.D.   On: 06/30/2020 14:32   DG Lumbar Spine Complete  Result Date: 06/30/2020 CLINICAL DATA:  Low back pain after fall yesterday. EXAM: LUMBAR SPINE - COMPLETE 4+ VIEW COMPARISON:  None. FINDINGS: No fracture or significant spondylolisthesis is noted. Moderate degenerative disc disease is noted at L3-4. Mild degenerative disc disease is noted at L2-3 and L4-5. IMPRESSION: Multilevel degenerative disc disease. No acute abnormality is noted. Aortic Atherosclerosis (ICD10-I70.0). Electronically Signed   By: Marijo Conception M.D.   On: 06/30/2020 14:31   DG Pelvis 1-2 Views  Result Date: 06/30/2020 CLINICAL DATA:  Fall with right leg pain EXAM: PELVIS - 1-2 VIEW COMPARISON:  12/16/2016 FINDINGS: SI joints are non widened. Status post bilateral hip replacements with normal alignment. No definitive fracture is seen. Pubic symphysis is intact. There are extensive vascular calcifications. IMPRESSION: Status post bilateral hip replacements without acute osseous abnormality. Electronically Signed   By: Donavan Foil M.D.   On: 06/30/2020 21:34   CT Chest Wo Contrast  Result Date: 06/30/2020 CLINICAL DATA:  Back pain, fall. EXAM: CT CHEST WITHOUT CONTRAST TECHNIQUE: Multidetector CT imaging of the chest was performed following the standard protocol without IV contrast. COMPARISON:  Chest radiograph 07/01/2019 and chest CT from 12/07/2008 FINDINGS: Cardiovascular: Coronary, aortic arch, and  branch vessel atherosclerotic vascular disease. Prior CABG. Mediastinum/Nodes: No pathologic adenopathy identified. Anterior to one of the bypass grafts, there is a small fluid density in the mediastinum measuring 2.3 by 0.9 cm on image 71 of series 3 which was not present on 12/07/2008, significance uncertain. This could be a low-density lymph node. Lungs/Pleura: Large left and small right pleural effusions with associated passive atelectasis. The left pleural effusion occupies 75% of left hemithoracic volume. Biapical pleuroparenchymal scarring in the lungs. There is no pneumothorax. The appearance on recent chest radiography is due to skin folds. Upper Abdomen: Cirrhosis. Extensive upper abdominal ascites. Cystic lesions with faint  marginal calcifications in the dome of the right and left hepatic lobe. Extensive edema in the upper abdominal mesentery and omentum. Aortoiliac atherosclerotic vascular disease. Prominently diminished right renal size compatible with atrophy. Hypodense lesion along the left renal pelvis, probably a peripelvic cyst. Other hypodense lesions of the left kidney are probably cysts but technically nonspecific. Musculoskeletal: Degenerative glenohumeral arthropathy bilaterally. Prominent degenerative sternoclavicular arthropathy bilaterally. Lower cervical spondylosis and thoracic spondylosis. No well-defined fracture. IMPRESSION: 1. No well-defined fracture is identified.  No pneumothorax. 2. Large left and small right pleural effusions with associated passive atelectasis. 3. Extensive upper abdominal ascites. Extensive edema in the upper abdominal mesentery and omentum. 4. Cirrhosis. 5. Coronary, aortic arch, and branch vessel atherosclerotic vascular disease. Prior CABG. 6. Prominently diminished right renal size compatible with atrophy. 7. Hypodense lesions of the left kidney are probably cysts but technically nonspecific. 8. Small fluid density in the mediastinum anterior to one of the  bypass grafts, significance uncertain. Possibly a small low-density lymph node. 9. Degenerative glenohumeral arthropathy bilaterally. Prominent degenerative sternoclavicular arthropathy bilaterally. 10. Aortic atherosclerosis. Aortic Atherosclerosis (ICD10-I70.0). Electronically Signed   By: Van Clines M.D.   On: 06/30/2020 18:57   DG Femur Min 2 Views Right  Result Date: 06/30/2020 CLINICAL DATA:  Fall, right leg pain, EXAM: RIGHT FEMUR 2 VIEWS COMPARISON:  None. FINDINGS: Two view radiograph right femur demonstrates surgical changes of right total hip arthroplasty. Arthroplasty components are in anatomic alignment. No fracture or dislocation. No lytic or blastic bone lesion. Advanced vascular calcifications are seen within the medial soft tissues. IMPRESSION: No acute fracture or dislocation. Electronically Signed   By: Fidela Salisbury MD   On: 06/30/2020 21:44     Time Spent in minutes  30     Desiree Hane M.D on 07/04/2020 at 12:37 PM  To page go to [email or web address] - password [facility]

## 2020-07-04 NOTE — Progress Notes (Signed)
[place] 0N02 [facility]) Hospitalized Hospice Patient  [id number] under hospice services with a terminal diagnosisof hypertensive heart disease, admitted tohospice serviceson 02/08/20. In the past weeks pt has been having increased BLE edema while having decreased appetite andweightloss of 5-6 lbs. Pt fell on 06/29/20, falling backwards and hittinghismid-back on nightstand.Pt unable to stand after fall, EMS was activated and pt was transported to ED for further evaluation.Pt was admitted to [facility] on chronic heart failure. Per Dr. Karie Georges with [facility], this is a relatedhospitaladmission.  Visited at bedside, wife at bedside. Sitting up in bed, alert and oriented, NAD.  Very pleasant and talkative. States his lunch was not as good today but that he ate his snacks and drink juice. Continues to want to to rehab for therapy.  VS: 98.1, 121/56, 77, 18, 94%RA Labs: 07/04/2020 04:51 Sodium: 134 (L) Calcium: 8.3 (L) IV/PRN medications:  No IV meds. Oxycodone 5mg  PRN q4hrs @ 0754, 1224  Problem List: - acute on chronic HF exacerbation - continue IV lasix, on room air, marked dyspnea with exertion  - acute lower back pain s/p fall - scheduled tylenol, oxycodone IR q4 as needed  GOC: clear, conservative management  D/C Planning: SNF. Pt cannot return to his home unable to walk, his wife is unable to care for him. He was previously independent with a walker. Family requests that they revoke his hospice benefit to allow his traditional medicare benefit to pay for SNF. He has been offered a room at [facility]. Family: present in room  IDT: hospice team updated  Farrel Gordon, RN, [place] (listed on [place] under [place])     [phone]

## 2020-07-05 DIAGNOSIS — Z515 Encounter for palliative care: Secondary | ICD-10-CM

## 2020-07-05 DIAGNOSIS — L8990 Pressure ulcer of unspecified site, unspecified stage: Secondary | ICD-10-CM | POA: Insufficient documentation

## 2020-07-05 LAB — SARS CORONAVIRUS 2 BY RT PCR (HOSPITAL ORDER, PERFORMED IN ~~LOC~~ HOSPITAL LAB): SARS Coronavirus 2: NEGATIVE

## 2020-07-05 MED ORDER — OXYCODONE HCL 5 MG PO TABS: 5.0000 mg | ORAL_TABLET | ORAL | 0 refills | Status: DC | PRN

## 2020-07-05 MED ORDER — MAGNESIUM CITRATE PO SOLN
1.0000 | Freq: Once | ORAL | Status: AC
  Administered 2020-07-05: 1 via ORAL
  Filled 2020-07-05: qty 296

## 2020-07-05 NOTE — Progress Notes (Addendum)
[facility]  PROGRESS NOTE  Bernard Byrd DGL:[id number] DOB: 10-Dec-1930 DOA: 06/30/2020 PCP: Dorothyann Peng, NP Admit date - 06/30/2020   Admitting Physician Desiree Hane, MD  Outpatient Primary MD for the patient is Dorothyann Peng, NP  LOS - 4 Brief Narrative  Bernard Byrd is a 86 year old male with medical history significant for chronic diastolic CHF for which he is receiving home hospice care, CKD, history of CVA, HTN, HLD who presented on 11/12 after a fall with worsening shortness of breath, worsening swelling.  Increase generalized weakness, was admitted with working diagnosis of acute on chronic diastolic CHF exacerbation.  In ER, chest x-ray initially was concerning for with moderate-sized pericardial effusion, CT chest thorax showed large left pleural effusion with associated atelectasis as well as extensive upper abdominal ascites, cirrhosis.  Subjective  No acute issues or events overnight, only complaint this morning is constipation, patient notes he has not had a bowel movement in 4 days requesting magnesium citrate and increase dose of MiraLAX.  Otherwise denies chest pain, shortness of breath, nausea, vomiting, diarrhea, headache, fevers, chills.  A & P   Acute on chronic diastolic CHF exacerbation,improving.   - TTE 08/23/2018 shows preserved EF.   -Prior to admission patient has extensive 3+ pitting edema from feet to bilateral flanks, large left-sided pleural effusion, and dyspnea with minimal exertion despite continued use of oral diuretics.   - CT confirms cirrhosis with extensive mesenteric edema, large pleural effusion.  - Dyspnea markedly improved, looks significantly less volume overloaded on exam.   -Continues to diurese quite well, continue oral Lasix twice daily at 40 mg  Large left-sided pleural effusion.  -In the setting of CHF exacerbation -Continue oral Lasix, no plans for thoracentesis -Respiratory status improving, without hypoxia   Cirrhosis  mesenteric edema and ascites seen on CT - Also evidence of mesenteric edema and ascites likely related to longstanding hepatic congestion from advanced CHF.  - No history of alcohol abuse per family. Denies abdominal pain  Generalized Weakness with Recent Fall and ongoing ambulatory dysfunction.   - Highest risk factor likely being profound volume overload contributing to leg weakness.   - Neurologically intact. -Baseline amatory status is independent with a walker -patient unable to walk at home prior to admission, PT recommending SNF  Acute lower Back pain after fall at home, pain better controlled.  - Spinal x-rays negative for fracture.  Patient states pain is somewhat improved on pain regimen but doesn't last long and has more pain when initiating movement. Under hospice care would not want any surgical intervention if there is a fracture -scheduled tylenol to 650 mg TID dosing (no further titration given (),  oxycodone IR 5 mg q4 hrs PRN pain -Optimize bowel regimen with scheduled MiraLAX   Essential hypertension  -Blood pressure currently well controlled -borderline low, if maintains MAP below 75 would consider decreasing home regimen again -Continue decreased dose of lopressor at 12.5mg  BID - Continue home amlodipine 10 mg  and closely monitor  CAD status post CABG, stable -No chest pain -Continue aspirin, Lopressor  Family Communication  :  Wife  updated at bedside on 11/17 Code Status :  DNR  Disposition Plan  :  Patient is from home under hospice care. Anticipated d/c date: Today, remains medically stable for discharge. Barriers to d/c or necessity for inpatient status:  Pending SNF placement and bed approval. Consults  :  None Procedures  :  None DVT Prophylaxis  :  Lovenox  MDM: The  below labs and imaging reports were reviewed and summarized above.  Medication management as above.  Lab Results  Component Value Date   PLT 129 (L) 07/01/2020    Diet :  Diet Order             Diet - low sodium heart healthy           DIET DYS 3 Room service appropriate? Yes with Assist; Fluid consistency: Thin  Diet effective now                  Inpatient Medications Scheduled Meds: . acetaminophen  650 mg Oral TID  . amLODipine  10 mg Oral Daily  . aspirin EC  81 mg Oral Daily  . furosemide  40 mg Oral Daily  . metoprolol tartrate  12.5 mg Oral BID  . polyethylene glycol  17 g Oral Daily  . senna-docusate  1 tablet Oral BID   Continuous Infusions: PRN Meds:.ondansetron **OR** ondansetron (ZOFRAN) IV, oxyCODONE, polyvinyl alcohol  Antibiotics  :   Anti-infectives (From admission, onward)   None       Objective   Vitals:   07/04/20 2009 07/05/20 0323 07/05/20 0500 07/05/20 1147  BP: 118/78  125/71 105/73  Pulse: 91  79 76  Resp: 16  18 18   Temp: 98.7 F (37.1 C)  98.9 F (37.2 C) 98.5 F (36.9 C)  TempSrc: Oral  Oral Oral  SpO2: 94%  96% 96%  Weight:  74.6 kg    Height:        SpO2: 96 %  Wt Readings from Last 3 Encounters:  07/05/20 74.6 kg  03/25/19 85.3 kg  03/05/19 86.2 kg     Intake/Output Summary (Last 24 hours) at 07/05/2020 1216 Last data filed at 07/05/2020 4734 Gross per 24 hour  Intake 460 ml  Output 1075 ml  Net -615 ml    Physical Exam: Awake Alert, Oriented X 3, Normal affect No new F.N deficits,  [place].AT, Normal respiratory effort on room air, . Decreased breath sounds at bases.  RRR,SEM appreciated, trace edema in lower extremities, trace pitting edema bilateral lower extremities, much improved from prior exams.+ve B.Sounds, Abd Soft and distended with no tenderness, No rebound, guarding or rigidity. No Cyanosis, No new Rash or bruise     I have personally reviewed the following:   Data Reviewed:  CBC Recent Labs  Lab 06/30/20 1714 07/01/20 0455  WBC 8.3 4.8  HGB 13.5 11.5*  HCT 41.3 35.0*  PLT 167 129*  MCV 104.0* 104.8*  MCH 34.0 34.4*  MCHC 32.7 32.9  RDW 15.2 15.3  LYMPHSABS 2.2  --     MONOABS 0.8  --   EOSABS 0.2  --   BASOSABS 0.0  --     Chemistries  Recent Labs  Lab 06/30/20 1439 07/01/20 0455 07/02/20 0141 07/03/20 0851 07/04/20 0451  NA 137 137 136 136 134*  K 3.7 3.6 3.5 3.5 3.6  CL 103 103 101 100 99  CO2 23 26 27 26 25   GLUCOSE 108* 81 78 112* 87  BUN 15 14 14 17 14   CREATININE 1.17 1.08 1.05 1.07 1.18  CALCIUM 8.7* 8.3* 7.9* 8.0* 8.3*  MG  --  1.9  --   --   --   AST 31  --   --   --   --   ALT 22  --   --   --   --   ALKPHOS 194*  --   --   --   --  BILITOT 1.1  --   --   --   --    ------------------------------------------------------------------------------------------------------------------ No results for input(s): CHOL, HDL, LDLCALC, TRIG, CHOLHDL, LDLDIRECT in the last 72 hours.  Lab Results  Component Value Date   HGBA1C 5.5 02/21/2017   ------------------------------------------------------------------------------------------------------------------ No results for input(s): TSH, T4TOTAL, T3FREE, THYROIDAB in the last 72 hours.  Invalid input(s): FREET3 ------------------------------------------------------------------------------------------------------------------ No results for input(s): VITAMINB12, FOLATE, FERRITIN, TIBC, IRON, RETICCTPCT in the last 72 hours.  Coagulation profile No results for input(s): INR, PROTIME in the last 168 hours.  No results for input(s): DDIMER in the last 72 hours.  Cardiac Enzymes No results for input(s): CKMB, TROPONINI, MYOGLOBIN in the last 168 hours.  Invalid input(s): CK ------------------------------------------------------------------------------------------------------------------    Component Value Date/Time   BNP 226.3 (H) 06/30/2020 1439    Micro Results Recent Results (from the past 240 hour(s))  Resp Panel by RT PCR (RSV, Flu A&B, Covid) - Nasopharyngeal Swab     Status: None   Collection Time: 06/30/20  2:53 PM   Specimen: Nasopharyngeal Swab  Result Value Ref Range  Status   SARS Coronavirus 2 by RT PCR NEGATIVE NEGATIVE Final    Comment: (NOTE) SARS-CoV-2 target nucleic acids are NOT DETECTED.  The SARS-CoV-2 RNA is generally detectable in upper respiratoy specimens during the acute phase of infection. The lowest concentration of SARS-CoV-2 viral copies this assay can detect is 131 copies/mL. A negative result does not preclude SARS-Cov-2 infection and should not be used as the sole basis for treatment or other patient management decisions. A negative result may occur with  improper specimen collection/handling, submission of specimen other than nasopharyngeal swab, presence of viral mutation(s) within the areas targeted by this assay, and inadequate number of viral copies (<131 copies/mL). A negative result must be combined with clinical observations, patient history, and epidemiological information. The expected result is Negative.  Fact Sheet for Patients:  PinkCheek.be  Fact Sheet for Healthcare Providers:  GravelBags.it  This test is no t yet approved or cleared by the [place] FDA and  has been authorized for detection and/or diagnosis of SARS-CoV-2 by FDA under an Emergency Use Authorization (EUA). This EUA will remain  in effect (meaning this test can be used) for the duration of the COVID-19 declaration under Section 564(b)(1) of the Act, 21 U.S.C. section 360bbb-3(b)(1), unless the authorization is terminated or revoked sooner.     Influenza A by PCR NEGATIVE NEGATIVE Final   Influenza B by PCR NEGATIVE NEGATIVE Final    Comment: (NOTE) The Xpert Xpress SARS-CoV-2/FLU/RSV assay is intended as an aid in  the diagnosis of influenza from Nasopharyngeal swab specimens and  should not be used as a sole basis for treatment. Nasal washings and  aspirates are unacceptable for Xpert Xpress SARS-CoV-2/FLU/RSV  testing.  Fact Sheet for  Patients: PinkCheek.be  Fact Sheet for Healthcare Providers: GravelBags.it  This test is not yet approved or cleared by the [place] FDA and  has been authorized for detection and/or diagnosis of SARS-CoV-2 by  FDA under an Emergency Use Authorization (EUA). This EUA will remain  in effect (meaning this test can be used) for the duration of the  Covid-19 declaration under Section 564(b)(1) of the Act, 21  U.S.C. section 360bbb-3(b)(1), unless the authorization is  terminated or revoked.    Respiratory Syncytial Virus by PCR NEGATIVE NEGATIVE Final    Comment: (NOTE) Fact Sheet for Patients: PinkCheek.be  Fact Sheet for Healthcare Providers: GravelBags.it  This test is not  yet approved or cleared by the [place] and  has been authorized for detection and/or diagnosis of SARS-CoV-2 by  FDA under an Emergency Use Authorization (EUA). This EUA will remain  in effect (meaning this test can be used) for the duration of the  COVID-19 declaration under Section 564(b)(1) of the Act, 21 U.S.C.  section 360bbb-3(b)(1), unless the authorization is terminated or  revoked. Performed at [facility], [place] [address] [place], [place] [address]     Radiology Reports DG Chest 2 View  Result Date: 06/30/2020 CLINICAL DATA:  Back pain after fall. EXAM: CHEST - 2 VIEW COMPARISON:  April 14, 2019. FINDINGS: Stable cardiomediastinal silhouette. Status post coronary bypass graft. Stable moderate left pleural effusion is noted with probable underlying atelectasis. Possible small right apical pneumothorax is noted. Bony thorax is unremarkable. IMPRESSION: Possible small right apical pneumothorax is noted; CT scan of the chest is recommended for further evaluation. Stable moderate left pleural effusion with probable underlying atelectasis. Electronically Signed   By: Marijo Conception M.D.   On: 06/30/2020 14:35   DG Thoracic Spine 2 View  Result Date: 06/30/2020 CLINICAL DATA:  Back pain after fall yesterday. EXAM: THORACIC SPINE 2 VIEWS COMPARISON:  None. FINDINGS: No fracture or spondylolisthesis is noted. Anterior osteophyte formation is noted at multiple levels in the lower thoracic spine. IMPRESSION: Mild multilevel degenerative changes. No acute abnormality seen in the thoracic spine. Electronically Signed   By: Marijo Conception M.D.   On: 06/30/2020 14:32   DG Lumbar Spine Complete  Result Date: 06/30/2020 CLINICAL DATA:  Low back pain after fall yesterday. EXAM: LUMBAR SPINE - COMPLETE 4+ VIEW COMPARISON:  None. FINDINGS: No fracture or significant spondylolisthesis is noted. Moderate degenerative disc disease is noted at L3-4. Mild degenerative disc disease is noted at L2-3 and L4-5. IMPRESSION: Multilevel degenerative disc disease. No acute abnormality is noted. Aortic Atherosclerosis (ICD10-I70.0). Electronically Signed   By: Marijo Conception M.D.   On: 06/30/2020 14:31   DG Pelvis 1-2 Views  Result Date: 06/30/2020 CLINICAL DATA:  Fall with right leg pain EXAM: PELVIS - 1-2 VIEW COMPARISON:  12/16/2016 FINDINGS: SI joints are non widened. Status post bilateral hip replacements with normal alignment. No definitive fracture is seen. Pubic symphysis is intact. There are extensive vascular calcifications. IMPRESSION: Status post bilateral hip replacements without acute osseous abnormality. Electronically Signed   By: Donavan Foil M.D.   On: 06/30/2020 21:34   CT Chest Wo Contrast  Result Date: 06/30/2020 CLINICAL DATA:  Back pain, fall. EXAM: CT CHEST WITHOUT CONTRAST TECHNIQUE: Multidetector CT imaging of the chest was performed following the standard protocol without IV contrast. COMPARISON:  Chest radiograph 07/01/2019 and chest CT from 12/07/2008 FINDINGS: Cardiovascular: Coronary, aortic arch, and branch vessel atherosclerotic vascular disease. Prior  CABG. Mediastinum/Nodes: No pathologic adenopathy identified. Anterior to one of the bypass grafts, there is a small fluid density in the mediastinum measuring 2.3 by 0.9 cm on image 71 of series 3 which was not present on 12/07/2008, significance uncertain. This could be a low-density lymph node. Lungs/Pleura: Large left and small right pleural effusions with associated passive atelectasis. The left pleural effusion occupies 75% of left hemithoracic volume. Biapical pleuroparenchymal scarring in the lungs. There is no pneumothorax. The appearance on recent chest radiography is due to skin folds. Upper Abdomen: Cirrhosis. Extensive upper abdominal ascites. Cystic lesions with faint marginal calcifications in the dome of the right and left hepatic lobe. Extensive edema in  the upper abdominal mesentery and omentum. Aortoiliac atherosclerotic vascular disease. Prominently diminished right renal size compatible with atrophy. Hypodense lesion along the left renal pelvis, probably a peripelvic cyst. Other hypodense lesions of the left kidney are probably cysts but technically nonspecific. Musculoskeletal: Degenerative glenohumeral arthropathy bilaterally. Prominent degenerative sternoclavicular arthropathy bilaterally. Lower cervical spondylosis and thoracic spondylosis. No well-defined fracture. IMPRESSION: 1. No well-defined fracture is identified.  No pneumothorax. 2. Large left and small right pleural effusions with associated passive atelectasis. 3. Extensive upper abdominal ascites. Extensive edema in the upper abdominal mesentery and omentum. 4. Cirrhosis. 5. Coronary, aortic arch, and branch vessel atherosclerotic vascular disease. Prior CABG. 6. Prominently diminished right renal size compatible with atrophy. 7. Hypodense lesions of the left kidney are probably cysts but technically nonspecific. 8. Small fluid density in the mediastinum anterior to one of the bypass grafts, significance uncertain. Possibly a  small low-density lymph node. 9. Degenerative glenohumeral arthropathy bilaterally. Prominent degenerative sternoclavicular arthropathy bilaterally. 10. Aortic atherosclerosis. Aortic Atherosclerosis (ICD10-I70.0). Electronically Signed   By: Van Clines M.D.   On: 06/30/2020 18:57   DG Femur Min 2 Views Right  Result Date: 06/30/2020 CLINICAL DATA:  Fall, right leg pain, EXAM: RIGHT FEMUR 2 VIEWS COMPARISON:  None. FINDINGS: Two view radiograph right femur demonstrates surgical changes of right total hip arthroplasty. Arthroplasty components are in anatomic alignment. No fracture or dislocation. No lytic or blastic bone lesion. Advanced vascular calcifications are seen within the medial soft tissues. IMPRESSION: No acute fracture or dislocation. Electronically Signed   By: Fidela Salisbury MD   On: 06/30/2020 21:44     Time Spent in minutes  Mora DO on 07/05/2020 at 12:16 PM

## 2020-07-05 NOTE — Progress Notes (Signed)
Occupational Therapy Treatment Patient Details Name: Bernard Byrd MRN: [id number] DOB: 12-21-1930 Today's Date: 07/05/2020    History of present illness 86 y.o male presenting s/p fall at home. Imaging neg for acute abnormality. PMH includes chronic diastolic CHF, CAD s/p CABG, history of CVA, HTN, HLD, and currently active with home hospice   OT comments  Pt with very limited participation today. Declined OOB or EOB activity today citing that his back is very painful right now. Encouraged and educated about positional changes and activity assisting with pain management and Pt able to participate in rolling in bed for repositioning. Will continue to follow acutely.    Follow Up Recommendations  SNF    Equipment Recommendations    none   Recommendations for Other Services   none    Precautions / Restrictions Precautions Precautions: Fall Restrictions Weight Bearing Restrictions: No       Mobility Bed Mobility Overal bed mobility: Needs Assistance Bed Mobility: Rolling Rolling: Max assist         General bed mobility comments: Pt assisted with sequencing to aide in rolling for relief of back pain, heavy use of pad to assist with trunk rotation and placement of pillows  Transfers                 General transfer comment: Pt delined depite encouragement and education    Balance                                           ADL either performed or assessed with clinical judgement   ADL Overall ADL's : Needs assistance/impaired     Grooming: Moderate assistance;Oral care;With caregiver independent assisting;Bed level Grooming Details (indicate cue type and reason): wife completing bed level oral care this morning                                     Vision       Perception     Praxis      Cognition Arousal/Alertness: Awake/alert Behavior During Therapy: Anxious Overall Cognitive Status: Impaired/Different from  baseline Area of Impairment: Safety/judgement;Memory                     Memory: Decreased short-term memory   Safety/Judgement: Decreased awareness of deficits     General Comments: Educated Pt on positional changes as potential relief with back pain        Exercises     Shoulder Instructions       General Comments Wife present and supportive    Pertinent Vitals/ Pain       Pain Assessment: Faces Faces Pain Scale: Hurts even more Pain Location: back with bed mobility Pain Descriptors / Indicators: Aching;Grimacing;Guarding;Constant  Home Living                                          Prior Functioning/Environment              Frequency  Min 2X/week        Progress Toward Goals  OT Goals(current goals can now be found in the care plan section)  Progress towards OT goals: Not progressing toward goals - comment (limited by  pain)  Acute Rehab OT Goals Patient Stated Goal: be able to go to and from bathroom OT Goal Formulation: With patient Time For Goal Achievement: 07/15/20 Potential to Achieve Goals: Good  Plan Discharge plan remains appropriate;Frequency remains appropriate    Co-evaluation                 AM-PAC OT "6 Clicks" Daily Activity     Outcome Measure   Help from another person eating meals?: A Lot Help from another person taking care of personal grooming?: A Lot Help from another person toileting, which includes using toliet, bedpan, or urinal?: A Lot Help from another person bathing (including washing, rinsing, drying)?: A Lot Help from another person to put on and taking off regular upper body clothing?: A Lot Help from another person to put on and taking off regular lower body clothing?: A Lot 6 Click Score: 12    End of Session    OT Visit Diagnosis: Unsteadiness on feet (R26.81);Other abnormalities of gait and mobility (R26.89);Muscle weakness (generalized) (M62.81)   Activity Tolerance Patient  limited by pain   Patient Left in bed;with call bell/phone within reach;with family/visitor present   Nurse Communication Mobility status        Time: 1001-1014 OT Time Calculation (min): 13 min  Charges: OT General Charges $OT Visit: 1 Visit OT Treatments $Self Care/Home Management : 8-22 mins  Jesse Sans OTR/L Acute Rehabilitation Services Pager: [phone] Office: [place] 07/05/2020, 10:47 AM

## 2020-07-05 NOTE — Progress Notes (Signed)
[name] 8P10 [facility]) Hospitalized Hospice Patient  [name] under hospice services with a terminal diagnosisof hypertensive heart disease, admitted tohospice serviceson 02/08/20. In the past weeks pt has been having increased BLE edema while having decreased appetite andweightloss of 5-6 lbs. Pt fell on 06/29/20, falling backwards and hittinghismid-back on nightstand.Pt unable to stand after fall, EMS was activated and pt was transported to ED for further evaluation.Pt was admitted to [facility] on chronic heart failure. Per Dr. Karie Georges with [facility], this is a relatedhospitaladmission.  Visited patient at the bedside, wife and daughter present. Pt is alert and oriented, states he is "miserable and ready to die". Provided space for pt to express his frustrations, he states "he cannot believe this is how he has ended up". [name] has been very active and independent until very recently and this has been a hard transition on him mentally.  V/S: 98.5 oral, 105/73, HR 76, RR 18 SPO2 96% on RA I&O: 700/1075 No lab work or diagnostics today.  IVs/PRNs:  Mag citrate ; oxy IR 5 mg PO x 1  Problem List: - acute on chronic HF - down 2.5 L since admission, pedal edema is improved, PO lasix daily - generalized weakness s/p fall - unable to get OOB today secondary to pain. Attending recommended further imaging, pt does not want any further work up.    GOC: clear, SNF for rehab. Family has chosen to revoke hospice to pursue SNF.  D/C planning: clear, SNF for rehab Famliy: updated in room IDT: hospice team aware  Venia Carbon RN, BSN, [facility] Liaison

## 2020-07-06 MED ORDER — PEG 3350-KCL-NA BICARB-NACL 420 G PO SOLR
4000.0000 mL | Freq: Once | ORAL | Status: DC
  Filled 2020-07-06: qty 4000

## 2020-07-06 MED ORDER — OXYCODONE HCL 5 MG PO TABS: 5.0000 mg | ORAL_TABLET | ORAL | 0 refills | Status: DC | PRN

## 2020-07-06 NOTE — Progress Notes (Signed)
[place] 3U02 [facility]) Hospitalized Hospice Patient  BX.[id number] under hospice services with a terminal diagnosisof hypertensive heart disease, admitted tohospice serviceson 02/08/20. In the past weeks pt has been having increased BLE edema while having decreased appetite andweightloss of 5-6 lbs. Pt fell on 06/29/20, falling backwards and hittinghismid-back on nightstand.Pt unable to stand after fall, EMS was activated and pt was transported to ED for further evaluation.Pt was admitted to [facility] on chronic heart failure. Per Dr. Karie Georges with [facility], this is a relatedhospitaladmission.  Visited patient at bedside, wife present. Patient was sleeping and wife asked that we not wake him. She reports he was having pain earlier and she doesn't want him disturbed. She states he has a bed at [place] once insurance approves, but that he cannot transfer until he has a bowel movement.   V/S: 98.6 oral, 1112/62, HR 70, RR 18 SPO2 90% on RA I&O: 600/550 No lab work or diagnostics today.  IVs/PRNs:  oxy IR 5 mg PO x 1 @ 6837  Problem List: - acute on chronic HF - down 2.5 L since admission, pedal edema is improved, PO lasix daily - generalized weakness s/p fall - unable to get OOB today secondary to pain. Attending recommended further imaging, pt does not want any further work up.    GOC: clear, SNF for rehab. Family has chosen to revoke hospice to pursue SNF.  D/C planning: clear, SNF for rehab [facility]: updated in room IDT: hospice team aware  Please call with any hospice related question.  Farrel Gordon, RN, CCM  [facility] Liaison (listed on [facility] under [place])  [phone]

## 2020-07-06 NOTE — Progress Notes (Signed)
Called to patient room to assist him x 2 to bedside commode, as he stated his stomach was rumbling and it felt like he had to move his bowels. Awaiting result at this time.

## 2020-07-06 NOTE — Progress Notes (Signed)
Physical Therapy Treatment Patient Details Name: Bernard Byrd MRN: [id number] DOB: Oct 05, 1930 Today's Date: 07/06/2020    History of Present Illness 86 y.o male presenting s/p fall at home. Imaging neg for acute abnormality. PMH includes chronic diastolic CHF, CAD s/p CABG, history of CVA, HTN, HLD, and currently active with home hospice    PT Comments    Pt supine in bed on arrival.  He required max cues for encouragement to participate in PT session.  Pt continues to require +2 assistance for safety and required close chair follow.  Plan for SNF remains appropriate.      Follow Up Recommendations  SNF     Equipment Recommendations  Other (comment) (defer to post acute setting.)    Recommendations for Other Services       Precautions / Restrictions Precautions Precautions: Fall Restrictions Weight Bearing Restrictions: No    Mobility  Bed Mobility Overal bed mobility: Needs Assistance Bed Mobility: Rolling     Supine to sit: Mod assist     General bed mobility comments: Assistance to move LEs to edge of bed and elevate trunk into a seated position.  Pt with posterior bias and required support to maintain balance until he was able to ground feet on the floor.  Transfers Overall transfer level: Needs assistance Equipment used: Rolling walker (2 wheeled) Transfers: Sit to/from Stand Sit to Stand: Mod assist;+2 safety/equipment         General transfer comment: Cues for hand placement to and from seated surface.  Performed from EOB and recliner.  Ambulation/Gait Ambulation/Gait assistance: +2 safety/equipment;Mod assist Gait Distance (Feet): 6 Feet (x2) Assistive device: Rolling walker (2 wheeled) Gait Pattern/deviations: Step-to pattern;Narrow base of support;Trunk flexed;Decreased stride length Gait velocity: decreased   General Gait Details: Pt required frequent cueing for head control and scapular retraction to move into standing.  Cues for stepping  initiation and keeping RW close.  Pt very fearful of falling and required close chair follow for safety.   Stairs             Wheelchair Mobility    Modified Rankin (Stroke Patients Only)       Balance Overall balance assessment: Needs assistance Sitting-balance support: Feet supported Sitting balance-Leahy Scale: Fair       Standing balance-Leahy Scale: Poor Standing balance comment: Heavy UE support and external assistance.                            Cognition Arousal/Alertness: Awake/alert Behavior During Therapy: Anxious Overall Cognitive Status: Impaired/Different from baseline Area of Impairment: Safety/judgement;Memory                     Memory: Decreased short-term memory   Safety/Judgement: Decreased awareness of deficits     General Comments: Educated Pt on positional changes as potential relief with back pain      Exercises      General Comments        Pertinent Vitals/Pain Pain Assessment: Faces Faces Pain Scale: Hurts even more Pain Location: back with bed mobility Pain Descriptors / Indicators: Aching;Grimacing;Guarding;Constant Pain Intervention(s): Monitored during session;Repositioned    Home Living                      Prior Function            PT Goals (current goals can now be found in the care plan section) Acute Rehab PT Goals Patient  Stated Goal: to sit back down Potential to Achieve Goals: Fair Progress towards PT goals: Progressing toward goals    Frequency    Min 2X/week      PT Plan Current plan remains appropriate    Co-evaluation              AM-PAC PT "6 Clicks" Mobility   Outcome Measure  Help needed turning from your back to your side while in a flat bed without using bedrails?: A Lot Help needed moving from lying on your back to sitting on the side of a flat bed without using bedrails?: A Lot Help needed moving to and from a bed to a chair (including a  wheelchair)?: A Lot Help needed standing up from a chair using your arms (e.g., wheelchair or bedside chair)?: A Lot Help needed to walk in hospital room?: A Lot Help needed climbing 3-5 steps with a railing? : Total 6 Click Score: 11    End of Session Equipment Utilized During Treatment: Gait belt Activity Tolerance: Patient tolerated treatment well;Patient limited by pain Patient left: with call bell/phone within reach;in bed;with bed alarm set;with family/visitor present Nurse Communication: Mobility status PT Visit Diagnosis: Unsteadiness on feet (R26.81);Other abnormalities of gait and mobility (R26.89);Muscle weakness (generalized) (M62.81);History of falling (Z91.81);Pain Pain - part of body:  (back)     Time: 9201-0071 PT Time Calculation (min) (ACUTE ONLY): 19 min  Charges:  $Gait Training: 8-22 mins                     Erasmo Leventhal , PTA Acute Rehabilitation Services Pager [phone] Office [phone]     Sundi Slevin Eli Hose 07/06/2020, 3:04 PM

## 2020-07-06 NOTE — TOC Progression Note (Signed)
Transition of Care [facility]) - Progression Note    Patient Details  Name: DERRIOUS BOLOGNA MRN: [id number] Date of Birth: 12-22-1930  Transition of Care [facility]) CM/SW [place], [place] Phone Number: 07/06/2020, 12:34 PM  Clinical Narrative:    CSW checked with SNF, still waiting on auth approval from [place]. CSW spoke with pt's daughter to let her know.    Expected Discharge Plan: [place] Barriers to Discharge: Continued Medical Work up, Ship broker  Expected Discharge Plan and Services Expected Discharge Plan: [place] In-house Referral: Clinical Social Work     Living arrangements for the past 2 months: Single Family Home Expected Discharge Date: 07/05/20                                     Social Determinants of Health (SDOH) Interventions    Readmission Risk Interventions No flowsheet data found.

## 2020-07-06 NOTE — Progress Notes (Signed)
Patient alert and oriented x 4. Resps even and unlabored, clear lung sounds throughout. PIV in right wrist is infiltrated and leaking. Will remove. No indication to replace at this time, as patient is not receiving any IV meds and is ready for discharge, just awaiting approval. Abdomen nontender and nondistended with active BS all four quads; however, patient is reported to be without a BM since 11/11. Apparently he was given a bottle of mag citrate yesterday but did not drink it because he did not like the tast. I explained why this was important and that heneeded to have a BM prior to DC or placement at a SNF. He verbalized understanding and spouse helped him with drinking the entire bottle. Patient also received senekot PO and miralax, which he did not finish. He prefers to drink juice andit was mixed in water; will try and remember to mix with juice next time. Patient complaining of pain to buttocks/low back area that radiates to his legs/knees since his fall. He has PRN Oxycodone every 4 hours for this and received a dose around 845 with positive results. Patient currently has condom cath in place draining clear yellow urine to gravity in drainage bag. He is full assist x 2 with transfers as he is terrified of falling again. Patient is currently awaiting insurance approval for placement in SNF. Call light in reach, wife at bedside, no further needs at this time.

## 2020-07-06 NOTE — Progress Notes (Signed)
After approximately 30 minutes, I was called back into the room to assist patient off the [facility]. He had an extra-extra large solid BM followed by liquid like stool. MD was notified and will hold the [facility] for now, as the problem appears to have been resolved.

## 2020-07-06 NOTE — Progress Notes (Signed)
[facility]  PROGRESS NOTE  LAVARIUS DOUGHTEN EKC:[id number] DOB: [birth year] DOA: 06/30/2020 PCP: Dorothyann Peng, NP Admit date - 06/30/2020   Admitting Physician Desiree Hane, MD  Outpatient Primary MD for the patient is Dorothyann Peng, NP  LOS - 5 Brief Narrative  Mr. Hornig is a 86 year old male with medical history significant for chronic diastolic CHF for which he is receiving home hospice care, CKD, history of CVA, HTN, HLD who presented on 11/12 after a fall with worsening shortness of breath, worsening swelling.  Increase generalized weakness, was admitted with working diagnosis of acute on chronic diastolic CHF exacerbation.  In ER, chest x-ray initially was concerning for with moderate-sized pericardial effusion, CT chest thorax showed large left pleural effusion with associated atelectasis as well as extensive upper abdominal ascites, cirrhosis.  Subjective  No acute issues or events overnight, patient continues to complain of ongoing constipation, patient continues to be somewhat reluctant to take increased bowel regimen. Otherwise denies chest pain, shortness of breath, nausea, vomiting, diarrhea, headache, fevers, chills.  A & P   Acute on chronic diastolic CHF exacerbation,improving.   - TTE 08/23/2018 shows preserved EF.   -Prior to admission patient had extensive 3+ pitting edema from feet to bilateral flanks, large left-sided pleural effusion, and dyspnea with minimal exertion despite continued use of oral diuretics.   - CT confirms cirrhosis with extensive mesenteric edema, large pleural effusion.  - Dyspnea markedly improved, looks significantly less volume overloaded on exam.   -Continues to diurese quite well, continue oral Lasix twice daily at 40 mg  Large left-sided pleural effusion.  -In the setting of CHF exacerbation -Continue oral Lasix, no plans for thoracentesis -Respiratory status improving, without hypoxia   Cirrhosis mesenteric edema and ascites seen  on CT - Also evidence of mesenteric edema and ascites likely related to longstanding hepatic congestion from advanced CHF.  - No history of alcohol abuse per family. Denies abdominal pain  Generalized Weakness with Recent Fall and ongoing ambulatory dysfunction.   - Highest risk factor likely being profound volume overload contributing to leg weakness.   - Neurologically intact. -Baseline amatory status is independent with a walker -patient unable to walk at home prior to admission, PT recommending SNF  Acute lower Back pain after fall at home, pain better controlled.  - Spinal x-rays negative for fracture.  Patient states pain is somewhat improved on pain regimen but doesn't last long and has more pain when initiating movement. Under hospice care would not want any surgical intervention if there is a fracture -scheduled tylenol to 650 mg TID dosing (no further titration given (),  oxycodone IR 5 mg q4 hrs PRN pain -Optimize bowel regimen with scheduled MiraLAX   Essential hypertension  -Blood pressure currently well controlled -borderline low, if maintains MAP below 75 would consider decreasing home regimen again -Continue decreased dose of lopressor at 12.5mg  BID - Continue home amlodipine 10 mg  and closely monitor  CAD status post CABG, stable -No chest pain -Continue aspirin, Lopressor  Family Communication  :  Wife  updated at bedside on 11/17 Code Status :  DNR  Disposition Plan  :  Patient is from home under hospice care. Anticipated d/c date: Today, to SNF, remains medically stable for discharge. Barriers to d/c or necessity for inpatient status:  Pending SNF placement and bed approval/insurance authorization. Consults  :  None Procedures  :  None DVT Prophylaxis  :  Lovenox  MDM: The below labs and imaging reports were  reviewed and summarized above.  Medication management as above.  Lab Results  Component Value Date   PLT 129 (L) 07/01/2020    Diet :  Diet Order             Diet - low sodium heart healthy           DIET DYS 3 Room service appropriate? Yes with Assist; Fluid consistency: Thin  Diet effective now                  Inpatient Medications Scheduled Meds: . acetaminophen  650 mg Oral TID  . amLODipine  10 mg Oral Daily  . aspirin EC  81 mg Oral Daily  . furosemide  40 mg Oral Daily  . metoprolol tartrate  12.5 mg Oral BID  . polyethylene glycol  17 g Oral Daily  . senna-docusate  1 tablet Oral BID   Continuous Infusions: PRN Meds:.ondansetron **OR** ondansetron (ZOFRAN) IV, oxyCODONE, polyvinyl alcohol  Antibiotics  :   Anti-infectives (From admission, onward)   None       Objective   Vitals:   07/05/20 2009 07/05/20 2044 07/06/20 0515 07/06/20 0515  BP: 108/69  112/62 112/62  Pulse: 82   70  Resp: 18  18 18   Temp: 98 F (36.7 C)  98.6 F (37 C) 98.6 F (37 C)  TempSrc:   Oral Oral  SpO2: (!) 89% 93% 90% 90%  Weight:   81 kg   Height:        SpO2: 90 %  Wt Readings from Last 3 Encounters:  07/06/20 81 kg  03/25/19 85.3 kg  03/05/19 86.2 kg     Intake/Output Summary (Last 24 hours) at 07/06/2020 0803 Last data filed at 07/06/2020 0200 Gross per 24 hour  Intake 600 ml  Output 550 ml  Net 50 ml    Physical Exam: Awake Alert, Oriented X 3, Normal affect No new F.N deficits,  [place].AT, Normal respiratory effort on room air, . Decreased breath sounds at bases.  RRR,SEM appreciated, trace edema in lower extremities, trace pitting edema bilateral lower extremities, much improved from prior exams.+ve B.Sounds, Abd Soft and distended with no tenderness, No rebound, guarding or rigidity. No Cyanosis, No new Rash or bruise     I have personally reviewed the following:   Data Reviewed:  CBC Recent Labs  Lab 06/30/20 1714 07/01/20 0455  WBC 8.3 4.8  HGB 13.5 11.5*  HCT 41.3 35.0*  PLT 167 129*  MCV 104.0* 104.8*  MCH 34.0 34.4*  MCHC 32.7 32.9  RDW 15.2 15.3  LYMPHSABS 2.2  --   MONOABS 0.8  --     EOSABS 0.2  --   BASOSABS 0.0  --     Chemistries  Recent Labs  Lab 06/30/20 1439 07/01/20 0455 07/02/20 0141 07/03/20 0851 07/04/20 0451  NA 137 137 136 136 134*  K 3.7 3.6 3.5 3.5 3.6  CL 103 103 101 100 99  CO2 23 26 27 26 25   GLUCOSE 108* 81 78 112* 87  BUN 15 14 14 17 14   CREATININE 1.17 1.08 1.05 1.07 1.18  CALCIUM 8.7* 8.3* 7.9* 8.0* 8.3*  MG  --  1.9  --   --   --   AST 31  --   --   --   --   ALT 22  --   --   --   --   ALKPHOS 194*  --   --   --   --  BILITOT 1.1  --   --   --   --    ------------------------------------------------------------------------------------------------------------------ No results for input(s): CHOL, HDL, LDLCALC, TRIG, CHOLHDL, LDLDIRECT in the last 72 hours.  Lab Results  Component Value Date   HGBA1C 5.5 02/21/2017   ------------------------------------------------------------------------------------------------------------------ No results for input(s): TSH, T4TOTAL, T3FREE, THYROIDAB in the last 72 hours.  Invalid input(s): FREET3 ------------------------------------------------------------------------------------------------------------------ No results for input(s): VITAMINB12, FOLATE, FERRITIN, TIBC, IRON, RETICCTPCT in the last 72 hours.  Coagulation profile No results for input(s): INR, PROTIME in the last 168 hours.  No results for input(s): DDIMER in the last 72 hours.  Cardiac Enzymes No results for input(s): CKMB, TROPONINI, MYOGLOBIN in the last 168 hours.  Invalid input(s): CK ------------------------------------------------------------------------------------------------------------------    Component Value Date/Time   BNP 226.3 (H) 06/30/2020 1439    Micro Results Recent Results (from the past 240 hour(s))  Resp Panel by RT PCR (RSV, Flu A&B, Covid) - Nasopharyngeal Swab     Status: None   Collection Time: 06/30/20  2:53 PM   Specimen: Nasopharyngeal Swab  Result Value Ref Range Status   SARS  Coronavirus 2 by RT PCR NEGATIVE NEGATIVE Final    Comment: (NOTE) SARS-CoV-2 target nucleic acids are NOT DETECTED.  The SARS-CoV-2 RNA is generally detectable in upper respiratoy specimens during the acute phase of infection. The lowest concentration of SARS-CoV-2 viral copies this assay can detect is 131 copies/mL. A negative result does not preclude SARS-Cov-2 infection and should not be used as the sole basis for treatment or other patient management decisions. A negative result may occur with  improper specimen collection/handling, submission of specimen other than nasopharyngeal swab, presence of viral mutation(s) within the areas targeted by this assay, and inadequate number of viral copies (<131 copies/mL). A negative result must be combined with clinical observations, patient history, and epidemiological information. The expected result is Negative.  Fact Sheet for Patients:  PinkCheek.be  Fact Sheet for Healthcare Providers:  GravelBags.it  This test is no t yet approved or cleared by the [place] FDA and  has been authorized for detection and/or diagnosis of SARS-CoV-2 by FDA under an Emergency Use Authorization (EUA). This EUA will remain  in effect (meaning this test can be used) for the duration of the COVID-19 declaration under Section 564(b)(1) of the Act, 21 U.S.C. section 360bbb-3(b)(1), unless the authorization is terminated or revoked sooner.     Influenza A by PCR NEGATIVE NEGATIVE Final   Influenza B by PCR NEGATIVE NEGATIVE Final    Comment: (NOTE) The Xpert Xpress SARS-CoV-2/FLU/RSV assay is intended as an aid in  the diagnosis of influenza from Nasopharyngeal swab specimens and  should not be used as a sole basis for treatment. Nasal washings and  aspirates are unacceptable for Xpert Xpress SARS-CoV-2/FLU/RSV  testing.  Fact Sheet for  Patients: PinkCheek.be  Fact Sheet for Healthcare Providers: GravelBags.it  This test is not yet approved or cleared by the [place] FDA and  has been authorized for detection and/or diagnosis of SARS-CoV-2 by  FDA under an Emergency Use Authorization (EUA). This EUA will remain  in effect (meaning this test can be used) for the duration of the  Covid-19 declaration under Section 564(b)(1) of the Act, 21  U.S.C. section 360bbb-3(b)(1), unless the authorization is  terminated or revoked.    Respiratory Syncytial Virus by PCR NEGATIVE NEGATIVE Final    Comment: (NOTE) Fact Sheet for Patients: PinkCheek.be  Fact Sheet for Healthcare Providers: GravelBags.it  This test is not  yet approved or cleared by the [place] and  has been authorized for detection and/or diagnosis of SARS-CoV-2 by  FDA under an Emergency Use Authorization (EUA). This EUA will remain  in effect (meaning this test can be used) for the duration of the  COVID-19 declaration under Section 564(b)(1) of the Act, 21 U.S.C.  section 360bbb-3(b)(1), unless the authorization is terminated or  revoked. Performed at [facility], [place] [address], [place], [place] [address]   SARS Coronavirus 2 by RT PCR (hospital order, performed in [facility] hospital lab) Nasopharyngeal Nasopharyngeal Swab     Status: None   Collection Time: 07/05/20  6:25 PM   Specimen: Nasopharyngeal Swab  Result Value Ref Range Status   SARS Coronavirus 2 NEGATIVE NEGATIVE Final    Comment: (NOTE) SARS-CoV-2 target nucleic acids are NOT DETECTED.  The SARS-CoV-2 RNA is generally detectable in upper and lower respiratory specimens during the acute phase of infection. The lowest concentration of SARS-CoV-2 viral copies this assay can detect is 250 copies / mL. A negative result does not preclude SARS-CoV-2  infection and should not be used as the sole basis for treatment or other patient management decisions.  A negative result may occur with improper specimen collection / handling, submission of specimen other than nasopharyngeal swab, presence of viral mutation(s) within the areas targeted by this assay, and inadequate number of viral copies (<250 copies / mL). A negative result must be combined with clinical observations, patient history, and epidemiological information.  Fact Sheet for Patients:   StrictlyIdeas.no  Fact Sheet for Healthcare Providers: BankingDealers.co.za  This test is not yet approved or  cleared by the [place] FDA and has been authorized for detection and/or diagnosis of SARS-CoV-2 by FDA under an Emergency Use Authorization (EUA).  This EUA will remain in effect (meaning this test can be used) for the duration of the COVID-19 declaration under Section 564(b)(1) of the Act, 21 U.S.C. section 360bbb-3(b)(1), unless the authorization is terminated or revoked sooner.  Performed at [facility], [place] [address], [place], [place] [address]     Radiology Reports DG Chest 2 View  Result Date: 06/30/2020 CLINICAL DATA:  Back pain after fall. EXAM: CHEST - 2 VIEW COMPARISON:  April 14, 2019. FINDINGS: Stable cardiomediastinal silhouette. Status post coronary bypass graft. Stable moderate left pleural effusion is noted with probable underlying atelectasis. Possible small right apical pneumothorax is noted. Bony thorax is unremarkable. IMPRESSION: Possible small right apical pneumothorax is noted; CT scan of the chest is recommended for further evaluation. Stable moderate left pleural effusion with probable underlying atelectasis. Electronically Signed   By: Marijo Conception M.D.   On: 06/30/2020 14:35   DG Thoracic Spine 2 View  Result Date: 06/30/2020 CLINICAL DATA:  Back pain after fall yesterday. EXAM: THORACIC  SPINE 2 VIEWS COMPARISON:  None. FINDINGS: No fracture or spondylolisthesis is noted. Anterior osteophyte formation is noted at multiple levels in the lower thoracic spine. IMPRESSION: Mild multilevel degenerative changes. No acute abnormality seen in the thoracic spine. Electronically Signed   By: Marijo Conception M.D.   On: 06/30/2020 14:32   DG Lumbar Spine Complete  Result Date: 06/30/2020 CLINICAL DATA:  Low back pain after fall yesterday. EXAM: LUMBAR SPINE - COMPLETE 4+ VIEW COMPARISON:  None. FINDINGS: No fracture or significant spondylolisthesis is noted. Moderate degenerative disc disease is noted at L3-4. Mild degenerative disc disease is noted at L2-3 and L4-5. IMPRESSION: Multilevel degenerative disc disease.  No acute abnormality is noted. Aortic Atherosclerosis (ICD10-I70.0). Electronically Signed   By: Marijo Conception M.D.   On: 06/30/2020 14:31   DG Pelvis 1-2 Views  Result Date: 06/30/2020 CLINICAL DATA:  Fall with right leg pain EXAM: PELVIS - 1-2 VIEW COMPARISON:  12/16/2016 FINDINGS: SI joints are non widened. Status post bilateral hip replacements with normal alignment. No definitive fracture is seen. Pubic symphysis is intact. There are extensive vascular calcifications. IMPRESSION: Status post bilateral hip replacements without acute osseous abnormality. Electronically Signed   By: Donavan Foil M.D.   On: 06/30/2020 21:34   CT Chest Wo Contrast  Result Date: 06/30/2020 CLINICAL DATA:  Back pain, fall. EXAM: CT CHEST WITHOUT CONTRAST TECHNIQUE: Multidetector CT imaging of the chest was performed following the standard protocol without IV contrast. COMPARISON:  Chest radiograph 07/01/2019 and chest CT from 12/07/2008 FINDINGS: Cardiovascular: Coronary, aortic arch, and branch vessel atherosclerotic vascular disease. Prior CABG. Mediastinum/Nodes: No pathologic adenopathy identified. Anterior to one of the bypass grafts, there is a small fluid density in the mediastinum measuring  2.3 by 0.9 cm on image 71 of series 3 which was not present on 12/07/2008, significance uncertain. This could be a low-density lymph node. Lungs/Pleura: Large left and small right pleural effusions with associated passive atelectasis. The left pleural effusion occupies 75% of left hemithoracic volume. Biapical pleuroparenchymal scarring in the lungs. There is no pneumothorax. The appearance on recent chest radiography is due to skin folds. Upper Abdomen: Cirrhosis. Extensive upper abdominal ascites. Cystic lesions with faint marginal calcifications in the dome of the right and left hepatic lobe. Extensive edema in the upper abdominal mesentery and omentum. Aortoiliac atherosclerotic vascular disease. Prominently diminished right renal size compatible with atrophy. Hypodense lesion along the left renal pelvis, probably a peripelvic cyst. Other hypodense lesions of the left kidney are probably cysts but technically nonspecific. Musculoskeletal: Degenerative glenohumeral arthropathy bilaterally. Prominent degenerative sternoclavicular arthropathy bilaterally. Lower cervical spondylosis and thoracic spondylosis. No well-defined fracture. IMPRESSION: 1. No well-defined fracture is identified.  No pneumothorax. 2. Large left and small right pleural effusions with associated passive atelectasis. 3. Extensive upper abdominal ascites. Extensive edema in the upper abdominal mesentery and omentum. 4. Cirrhosis. 5. Coronary, aortic arch, and branch vessel atherosclerotic vascular disease. Prior CABG. 6. Prominently diminished right renal size compatible with atrophy. 7. Hypodense lesions of the left kidney are probably cysts but technically nonspecific. 8. Small fluid density in the mediastinum anterior to one of the bypass grafts, significance uncertain. Possibly a small low-density lymph node. 9. Degenerative glenohumeral arthropathy bilaterally. Prominent degenerative sternoclavicular arthropathy bilaterally. 10. Aortic  atherosclerosis. Aortic Atherosclerosis (ICD10-I70.0). Electronically Signed   By: Van Clines M.D.   On: 06/30/2020 18:57   DG Femur Min 2 Views Right  Result Date: 06/30/2020 CLINICAL DATA:  Fall, right leg pain, EXAM: RIGHT FEMUR 2 VIEWS COMPARISON:  None. FINDINGS: Two view radiograph right femur demonstrates surgical changes of right total hip arthroplasty. Arthroplasty components are in anatomic alignment. No fracture or dislocation. No lytic or blastic bone lesion. Advanced vascular calcifications are seen within the medial soft tissues. IMPRESSION: No acute fracture or dislocation. Electronically Signed   By: Fidela Salisbury MD   On: 06/30/2020 21:44     Time Spent in minutes  Hood DO on 07/06/2020 at 8:03 AM

## 2020-07-06 NOTE — Progress Notes (Signed)
Around 12pm patient was requesting more pain med for back/butt and leg pain. As it was not time for another dose, I offered hot packs to the affected area. When I went back to check on patient, he was asleep. Around 1:30pm I again went to check on patient to see if he needed any additional pain control meds and he was ecstatic because the hot pads worked. He stated that within 10 minutes of application, he felt relief. He stated that he will likely ask for the heat application before the oxy if needed later.

## 2020-07-07 NOTE — TOC Progression Note (Signed)
Transition of Care [facility]) - Progression Note    Patient Details  Name: Bernard TIPPS MRN: [id number] Date of Birth: 07-26-1931  [facility]) CM/SW [place], [place] Phone Number: 07/07/2020, 5:41 PM  Clinical Narrative:    CSW spoke with SNF multiple times today in reference to whether or not auth was provided for pt for SNF stay, unfortunately SNF still waiting on auth details. CSW spoke with pt's daughter Rolin Barry that per earlier conversation with Hospice SW, today will be the last day that Hospice insurance will cover pt's hospital stay and there is a chance that there would be a charge to the pt. CSW checked with financial counseling to see if pt's [place] will pick up after Hospice insurance drops off, they weren't clear on what would happen. CSW will continue to follow.    Expected Discharge Plan: [place] Barriers to Discharge: Continued Medical Work up, Ship broker  Expected Discharge Plan and Services Expected Discharge Plan: [place] In-house Referral: Clinical Social Work     Living arrangements for the past 2 months: Single Family Home Expected Discharge Date: 07/05/20                                     Social Determinants of Health (SDOH) Interventions    Readmission Risk Interventions No flowsheet data found.

## 2020-07-07 NOTE — Progress Notes (Signed)
[facility]  PROGRESS NOTE  Bernard Byrd HWE:[id number] DOB: 1931-04-12 DOA: 06/30/2020 PCP: Dorothyann Peng, NP Admit date - 06/30/2020   Admitting Physician Desiree Hane, MD  Outpatient Primary MD for the patient is Dorothyann Peng, NP  LOS - 6 Brief Narrative  [name] is a 86 year old male with medical history significant for chronic diastolic CHF for which he is receiving home hospice care, CKD, history of CVA, HTN, HLD who presented on 11/12 after a fall with worsening shortness of breath, worsening swelling.  Increase generalized weakness, was admitted with working diagnosis of acute on chronic diastolic CHF exacerbation.  In ER, chest x-ray initially was concerning for with moderate-sized pericardial effusion, CT chest thorax showed large left pleural effusion with associated atelectasis as well as extensive upper abdominal ascites, cirrhosis.  Subjective  No acute issues or events overnight, sizeable BM yesterday with resolution of abdominal symptoms. Otherwise denies chest pain, shortness of breath, nausea, vomiting, diarrhea, headache, fevers, chills.  A & P   Acute on chronic diastolic CHF exacerbation,improving.   - TTE 08/23/2018 shows preserved EF.   -Prior to admission patient had extensive 3+ pitting edema from feet to bilateral flanks, large left-sided pleural effusion, and dyspnea with minimal exertion despite continued use of oral diuretics.   - CT confirms cirrhosis with extensive mesenteric edema, large pleural effusion.  - Dyspnea markedly improved, looks significantly less volume overloaded on exam.   -Continues to diurese quite well, continue oral Lasix twice daily at 40 mg  Large left-sided pleural effusion.  -In the setting of CHF exacerbation -Continue oral Lasix, no plans for thoracentesis -Respiratory status improving, without hypoxia   Cirrhosis mesenteric edema and ascites seen on CT - Also evidence of mesenteric edema and ascites likely related  to longstanding hepatic congestion from advanced CHF.  - No history of alcohol abuse per family. Denies abdominal pain  Generalized Weakness with Recent Fall and ongoing ambulatory dysfunction.   - Highest risk factor likely being profound volume overload contributing to leg weakness.   - Neurologically intact. -Baseline amatory status is independent with a walker -patient unable to walk at home prior to admission, PT recommending SNF  Acute lower Back pain after fall at home, pain better controlled.  - Spinal x-rays negative for fracture.  Patient states pain is somewhat improved on pain regimen but doesn't last long and has more pain when initiating movement. Under hospice care would not want any surgical intervention if there is a fracture -scheduled tylenol to 650 mg TID dosing (no further titration given (),  oxycodone IR 5 mg q4 hrs PRN pain -Optimize bowel regimen with scheduled MiraLAX   Essential hypertension  -Blood pressure currently well controlled -borderline low, if maintains MAP below 75 would consider decreasing home regimen again -Continue decreased dose of lopressor at 12.5mg  BID - Continue home amlodipine 10 mg  and closely monitor  CAD status post CABG, stable -No chest pain -Continue aspirin, Lopressor  Family Communication  :  Wife  updated at bedside on 11/17 Code Status :  DNR  Disposition Plan  :  Patient is from home under hospice care. Anticipated d/c date: Today, to SNF, remains medically stable for discharge. Barriers to d/c or necessity for inpatient status:  Pending SNF placement and bed approval/insurance authorization. Consults  :  None Procedures  :  None DVT Prophylaxis  :  Lovenox  MDM: The below labs and imaging reports were reviewed and summarized above.  Medication management as above.  Lab Results  Component Value Date   PLT 129 (L) 07/01/2020    Diet :  Diet Order            Diet - low sodium heart healthy           DIET DYS 3 Room  service appropriate? Yes with Assist; Fluid consistency: Thin  Diet effective now                  Inpatient Medications Scheduled Meds: . acetaminophen  650 mg Oral TID  . amLODipine  10 mg Oral Daily  . aspirin EC  81 mg Oral Daily  . furosemide  40 mg Oral Daily  . metoprolol tartrate  12.5 mg Oral BID  . polyethylene glycol  17 g Oral Daily  . polyethylene glycol-electrolytes  4,000 mL Oral Once  . senna-docusate  1 tablet Oral BID   Continuous Infusions: PRN Meds:.ondansetron **OR** ondansetron (ZOFRAN) IV, oxyCODONE, polyvinyl alcohol  Antibiotics  :   Anti-infectives (From admission, onward)   None       Objective   Vitals:   07/06/20 0515 07/06/20 0515 07/06/20 2005 07/07/20 0419  BP: 112/62 112/62 123/69 120/73  Pulse:  70 68 76  Resp: 18 18 18 18   Temp: 98.6 F (37 C) 98.6 F (37 C) 97.8 F (36.6 C) 98.2 F (36.8 C)  TempSrc: Oral Oral Oral Oral  SpO2: 90% 90% 93% 94%  Weight: 81 kg   79.8 kg  Height:        SpO2: 94 %  Wt Readings from Last 3 Encounters:  07/07/20 79.8 kg  03/25/19 85.3 kg  03/05/19 86.2 kg     Intake/Output Summary (Last 24 hours) at 07/07/2020 0805 Last data filed at 07/07/2020 0640 Gross per 24 hour  Intake 600 ml  Output 650 ml  Net -50 ml    Physical Exam: Awake Alert, Oriented X 3, Normal affect No new F.N deficits,  [place].AT, Normal respiratory effort on room air, . Decreased breath sounds at bases.  RRR,SEM appreciated, trace edema in lower extremities, trace pitting edema bilateral lower extremities, much improved from prior exams.+ve B.Sounds, Abd Soft and distended with no tenderness, No rebound, guarding or rigidity. No Cyanosis, No new Rash or bruise     I have personally reviewed the following:   Data Reviewed:  CBC Recent Labs  Lab 06/30/20 1714 07/01/20 0455  WBC 8.3 4.8  HGB 13.5 11.5*  HCT 41.3 35.0*  PLT 167 129*  MCV 104.0* 104.8*  MCH 34.0 34.4*  MCHC 32.7 32.9  RDW 15.2 15.3   LYMPHSABS 2.2  --   MONOABS 0.8  --   EOSABS 0.2  --   BASOSABS 0.0  --     Chemistries  Recent Labs  Lab 06/30/20 1439 07/01/20 0455 07/02/20 0141 07/03/20 0851 07/04/20 0451  NA 137 137 136 136 134*  K 3.7 3.6 3.5 3.5 3.6  CL 103 103 101 100 99  CO2 23 26 27 26 25   GLUCOSE 108* 81 78 112* 87  BUN 15 14 14 17 14   CREATININE 1.17 1.08 1.05 1.07 1.18  CALCIUM 8.7* 8.3* 7.9* 8.0* 8.3*  MG  --  1.9  --   --   --   AST 31  --   --   --   --   ALT 22  --   --   --   --   ALKPHOS 194*  --   --   --   --  BILITOT 1.1  --   --   --   --    ------------------------------------------------------------------------------------------------------------------ No results for input(s): CHOL, HDL, LDLCALC, TRIG, CHOLHDL, LDLDIRECT in the last 72 hours.  Lab Results  Component Value Date   HGBA1C 5.5 02/21/2017   ------------------------------------------------------------------------------------------------------------------ No results for input(s): TSH, T4TOTAL, T3FREE, THYROIDAB in the last 72 hours.  Invalid input(s): FREET3 ------------------------------------------------------------------------------------------------------------------ No results for input(s): VITAMINB12, FOLATE, FERRITIN, TIBC, IRON, RETICCTPCT in the last 72 hours.  Coagulation profile No results for input(s): INR, PROTIME in the last 168 hours.  No results for input(s): DDIMER in the last 72 hours.  Cardiac Enzymes No results for input(s): CKMB, TROPONINI, MYOGLOBIN in the last 168 hours.  Invalid input(s): CK ------------------------------------------------------------------------------------------------------------------    Component Value Date/Time   BNP 226.3 (H) 06/30/2020 1439    Micro Results Recent Results (from the past 240 hour(s))  Resp Panel by RT PCR (RSV, Flu A&B, Covid) - Nasopharyngeal Swab     Status: None   Collection Time: 06/30/20  2:53 PM   Specimen: Nasopharyngeal Swab   Result Value Ref Range Status   SARS Coronavirus 2 by RT PCR NEGATIVE NEGATIVE Final    Comment: (NOTE) SARS-CoV-2 target nucleic acids are NOT DETECTED.  The SARS-CoV-2 RNA is generally detectable in upper respiratoy specimens during the acute phase of infection. The lowest concentration of SARS-CoV-2 viral copies this assay can detect is 131 copies/mL. A negative result does not preclude SARS-Cov-2 infection and should not be used as the sole basis for treatment or other patient management decisions. A negative result may occur with  improper specimen collection/handling, submission of specimen other than nasopharyngeal swab, presence of viral mutation(s) within the areas targeted by this assay, and inadequate number of viral copies (<131 copies/mL). A negative result must be combined with clinical observations, patient history, and epidemiological information. The expected result is Negative.  Fact Sheet for Patients:  PinkCheek.be  Fact Sheet for Healthcare Providers:  GravelBags.it  This test is no t yet approved or cleared by the [place] FDA and  has been authorized for detection and/or diagnosis of SARS-CoV-2 by FDA under an Emergency Use Authorization (EUA). This EUA will remain  in effect (meaning this test can be used) for the duration of the COVID-19 declaration under Section 564(b)(1) of the Act, 21 U.S.C. section 360bbb-3(b)(1), unless the authorization is terminated or revoked sooner.     Influenza A by PCR NEGATIVE NEGATIVE Final   Influenza B by PCR NEGATIVE NEGATIVE Final    Comment: (NOTE) The Xpert Xpress SARS-CoV-2/FLU/RSV assay is intended as an aid in  the diagnosis of influenza from Nasopharyngeal swab specimens and  should not be used as a sole basis for treatment. Nasal washings and  aspirates are unacceptable for Xpert Xpress SARS-CoV-2/FLU/RSV  testing.  Fact Sheet for  Patients: PinkCheek.be  Fact Sheet for Healthcare Providers: GravelBags.it  This test is not yet approved or cleared by the [place] FDA and  has been authorized for detection and/or diagnosis of SARS-CoV-2 by  FDA under an Emergency Use Authorization (EUA). This EUA will remain  in effect (meaning this test can be used) for the duration of the  Covid-19 declaration under Section 564(b)(1) of the Act, 21  U.S.C. section 360bbb-3(b)(1), unless the authorization is  terminated or revoked.    Respiratory Syncytial Virus by PCR NEGATIVE NEGATIVE Final    Comment: (NOTE) Fact Sheet for Patients: PinkCheek.be  Fact Sheet for Healthcare Providers: GravelBags.it  This test is not  yet approved or cleared by the [place] and  has been authorized for detection and/or diagnosis of SARS-CoV-2 by  FDA under an Emergency Use Authorization (EUA). This EUA will remain  in effect (meaning this test can be used) for the duration of the  COVID-19 declaration under Section 564(b)(1) of the Act, 21 U.S.C.  section 360bbb-3(b)(1), unless the authorization is terminated or  revoked. Performed at [facility], [place] [address] [place], [place] [address]   SARS Coronavirus 2 by RT PCR (hospital order, performed in [facility] hospital lab) Nasopharyngeal Nasopharyngeal Swab     Status: None   Collection Time: 07/05/20  6:25 PM   Specimen: Nasopharyngeal Swab  Result Value Ref Range Status   SARS Coronavirus 2 NEGATIVE NEGATIVE Final    Comment: (NOTE) SARS-CoV-2 target nucleic acids are NOT DETECTED.  The SARS-CoV-2 RNA is generally detectable in upper and lower respiratory specimens during the acute phase of infection. The lowest concentration of SARS-CoV-2 viral copies this assay can detect is 250 copies / mL. A negative result does not preclude SARS-CoV-2  infection and should not be used as the sole basis for treatment or other patient management decisions.  A negative result may occur with improper specimen collection / handling, submission of specimen other than nasopharyngeal swab, presence of viral mutation(s) within the areas targeted by this assay, and inadequate number of viral copies (<250 copies / mL). A negative result must be combined with clinical observations, patient history, and epidemiological information.  Fact Sheet for Patients:   StrictlyIdeas.no  Fact Sheet for Healthcare Providers: BankingDealers.co.za  This test is not yet approved or  cleared by the [place] FDA and has been authorized for detection and/or diagnosis of SARS-CoV-2 by FDA under an Emergency Use Authorization (EUA).  This EUA will remain in effect (meaning this test can be used) for the duration of the COVID-19 declaration under Section 564(b)(1) of the Act, 21 U.S.C. section 360bbb-3(b)(1), unless the authorization is terminated or revoked sooner.  Performed at [facility], [place] [address], [place], [place] [address]     Radiology Reports DG Chest 2 View  Result Date: 06/30/2020 CLINICAL DATA:  Back pain after fall. EXAM: CHEST - 2 VIEW COMPARISON:  April 14, 2019. FINDINGS: Stable cardiomediastinal silhouette. Status post coronary bypass graft. Stable moderate left pleural effusion is noted with probable underlying atelectasis. Possible small right apical pneumothorax is noted. Bony thorax is unremarkable. IMPRESSION: Possible small right apical pneumothorax is noted; CT scan of the chest is recommended for further evaluation. Stable moderate left pleural effusion with probable underlying atelectasis. Electronically Signed   By: Marijo Conception M.D.   On: 06/30/2020 14:35   DG Thoracic Spine 2 View  Result Date: 06/30/2020 CLINICAL DATA:  Back pain after fall yesterday. EXAM: THORACIC  SPINE 2 VIEWS COMPARISON:  None. FINDINGS: No fracture or spondylolisthesis is noted. Anterior osteophyte formation is noted at multiple levels in the lower thoracic spine. IMPRESSION: Mild multilevel degenerative changes. No acute abnormality seen in the thoracic spine. Electronically Signed   By: Marijo Conception M.D.   On: 06/30/2020 14:32   DG Lumbar Spine Complete  Result Date: 06/30/2020 CLINICAL DATA:  Low back pain after fall yesterday. EXAM: LUMBAR SPINE - COMPLETE 4+ VIEW COMPARISON:  None. FINDINGS: No fracture or significant spondylolisthesis is noted. Moderate degenerative disc disease is noted at L3-4. Mild degenerative disc disease is noted at L2-3 and L4-5. IMPRESSION: Multilevel degenerative disc disease.  No acute abnormality is noted. Aortic Atherosclerosis (ICD10-I70.0). Electronically Signed   By: Marijo Conception M.D.   On: 06/30/2020 14:31   DG Pelvis 1-2 Views  Result Date: 06/30/2020 CLINICAL DATA:  Fall with right leg pain EXAM: PELVIS - 1-2 VIEW COMPARISON:  12/16/2016 FINDINGS: SI joints are non widened. Status post bilateral hip replacements with normal alignment. No definitive fracture is seen. Pubic symphysis is intact. There are extensive vascular calcifications. IMPRESSION: Status post bilateral hip replacements without acute osseous abnormality. Electronically Signed   By: Donavan Foil M.D.   On: 06/30/2020 21:34   CT Chest Wo Contrast  Result Date: 06/30/2020 CLINICAL DATA:  Back pain, fall. EXAM: CT CHEST WITHOUT CONTRAST TECHNIQUE: Multidetector CT imaging of the chest was performed following the standard protocol without IV contrast. COMPARISON:  Chest radiograph 07/01/2019 and chest CT from 12/07/2008 FINDINGS: Cardiovascular: Coronary, aortic arch, and branch vessel atherosclerotic vascular disease. Prior CABG. Mediastinum/Nodes: No pathologic adenopathy identified. Anterior to one of the bypass grafts, there is a small fluid density in the mediastinum measuring  2.3 by 0.9 cm on image 71 of series 3 which was not present on 12/07/2008, significance uncertain. This could be a low-density lymph node. Lungs/Pleura: Large left and small right pleural effusions with associated passive atelectasis. The left pleural effusion occupies 75% of left hemithoracic volume. Biapical pleuroparenchymal scarring in the lungs. There is no pneumothorax. The appearance on recent chest radiography is due to skin folds. Upper Abdomen: Cirrhosis. Extensive upper abdominal ascites. Cystic lesions with faint marginal calcifications in the dome of the right and left hepatic lobe. Extensive edema in the upper abdominal mesentery and omentum. Aortoiliac atherosclerotic vascular disease. Prominently diminished right renal size compatible with atrophy. Hypodense lesion along the left renal pelvis, probably a peripelvic cyst. Other hypodense lesions of the left kidney are probably cysts but technically nonspecific. Musculoskeletal: Degenerative glenohumeral arthropathy bilaterally. Prominent degenerative sternoclavicular arthropathy bilaterally. Lower cervical spondylosis and thoracic spondylosis. No well-defined fracture. IMPRESSION: 1. No well-defined fracture is identified.  No pneumothorax. 2. Large left and small right pleural effusions with associated passive atelectasis. 3. Extensive upper abdominal ascites. Extensive edema in the upper abdominal mesentery and omentum. 4. Cirrhosis. 5. Coronary, aortic arch, and branch vessel atherosclerotic vascular disease. Prior CABG. 6. Prominently diminished right renal size compatible with atrophy. 7. Hypodense lesions of the left kidney are probably cysts but technically nonspecific. 8. Small fluid density in the mediastinum anterior to one of the bypass grafts, significance uncertain. Possibly a small low-density lymph node. 9. Degenerative glenohumeral arthropathy bilaterally. Prominent degenerative sternoclavicular arthropathy bilaterally. 10. Aortic  atherosclerosis. Aortic Atherosclerosis (ICD10-I70.0). Electronically Signed   By: Van Clines M.D.   On: 06/30/2020 18:57   DG Femur Min 2 Views Right  Result Date: 06/30/2020 CLINICAL DATA:  Fall, right leg pain, EXAM: RIGHT FEMUR 2 VIEWS COMPARISON:  None. FINDINGS: Two view radiograph right femur demonstrates surgical changes of right total hip arthroplasty. Arthroplasty components are in anatomic alignment. No fracture or dislocation. No lytic or blastic bone lesion. Advanced vascular calcifications are seen within the medial soft tissues. IMPRESSION: No acute fracture or dislocation. Electronically Signed   By: Fidela Salisbury MD   On: 06/30/2020 21:44     Time Spent in minutes  [place] DO on 07/07/2020 at 8:05 AM

## 2020-07-07 NOTE — Progress Notes (Signed)
[place] [id number] [facility]) Hospitalized Hospice Patient  HW.YSHUOHF under hospice services with a terminal diagnosisof hypertensive heart disease, admitted tohospice serviceson 02/08/20. In the past weeks pt has been having increased BLE edema while having decreased appetite andweightloss of 5-6 lbs. Pt fell on 06/29/20, falling backwards and hittinghismid-back on nightstand.Pt unable to stand after fall, EMS was activated and pt was transported to ED for further evaluation.Pt was admitted to [facility] on chronic heart failure. Per Dr. Karie Georges with [facility], this is a relatedhospitaladmission.  Visited pt and his wife at the bedside. Still waiting on insurance authorization before he can be moved to [place]. Pt states pain is somewhat better, his mood is improved today.   V/S: 98.1 oral, 119/66, HE 64, RR 16, SPO2 93% on RA I&O: 840/650 Labs: no new since 11/16  Problem list: - acute on chronic HF - down 2.5 L since admission, pedal edema is improved, PO lasix daily - generalized weakness s/p fall - was able to get OOB with assistance today  D/C Planning:  To SNF once insurance authorization is finished GOC: Clear. Attempt to regain some mobility at SNF. Familiy: updated in room IDT: Hospice team updated  Venia Carbon RN, BSN, [facility] Liaison

## 2020-07-08 ENCOUNTER — Inpatient Hospital Stay (HOSPITAL_COMMUNITY)

## 2020-07-08 DIAGNOSIS — R410 Disorientation, unspecified: Secondary | ICD-10-CM

## 2020-07-08 DIAGNOSIS — R443 Hallucinations, unspecified: Secondary | ICD-10-CM

## 2020-07-08 LAB — URINALYSIS, ROUTINE W REFLEX MICROSCOPIC
Bilirubin Urine: NEGATIVE
Glucose, UA: NEGATIVE mg/dL
Hgb urine dipstick: NEGATIVE
Ketones, ur: NEGATIVE mg/dL
Leukocytes,Ua: NEGATIVE
Nitrite: POSITIVE — AB
Protein, ur: NEGATIVE mg/dL
Specific Gravity, Urine: 1.013 (ref 1.005–1.030)
pH: 8 (ref 5.0–8.0)

## 2020-07-08 LAB — AMMONIA: Ammonia: 26 umol/L (ref 9–35)

## 2020-07-08 LAB — GLUCOSE, CAPILLARY: Glucose-Capillary: 91 mg/dL (ref 70–99)

## 2020-07-08 MED ORDER — CIPROFLOXACIN HCL 500 MG PO TABS: 500.0000 mg | ORAL_TABLET | Freq: Two times a day (BID) | ORAL | 0 refills | Status: AC

## 2020-07-08 NOTE — Progress Notes (Signed)
OT Cancellation Note  Patient Details Name: Bernard Byrd MRN: [id number] DOB: 1931/06/25   Cancelled Treatment:    Reason Eval/Treat Not Completed: Medical issues which prohibited therapy Rapid response called this AM due to increased confusion and hallucinations, MRI ordered and pending. Will re-attempt once medically appropriate.   Corinne Ports E. Adrianna Dudas, COTA/L Acute Rehabilitation Services [place] 07/08/2020, 11:17 AM

## 2020-07-08 NOTE — Consult Note (Addendum)
NEURO HOSPITALIST CONSULT NOTE   Requestig physician: Dr. Avon Gully  Reason for Consult: Fall 7 days ago striking head and buttocks, with new onset hallucinations, transient episodes of confusion, slurred speech and LLE weakness.   History obtained from:  Patient, Family and Chart     HPI:                                                                                                                                          Bernard Byrd is an 86 y.o. male with a PMHx of CAD, CABG 2008, possible CVA/TIA following CABG, DVT 2009, left lower quadrant chronic groin pain near site of MESH placement, HLD, HTN, Melanoma, MI, hip osteoarthritis, bilateral anterior hip arthoroplasties, retroperitoneal hematoma and Greenfield filter placemen who initially presented to the hospital on 11/12 after a fall at home in which he struck the back of his head loud enough to hear a cracking noise. He also had landed on his behind, with left hip pain noted following this. He had worsening SOB and worsening BLE swelling, with increased generalized weakness and was diagnosed with acute on chronic diastolic CHF exacerbation. Imaging revealed a large left pleural effusion with associated atelectasis as well as extensive upper abdominal ascites, cirrhosis. He was admitted for acute CHF and was diuresed, with significant improvement of his leg swelling.   Yesterday, the patient exhibited some confusion. This AM, he had visual hallucinations of flies of many different sizes on the wall in front of him. The flies appeared to coalesce and then fly out of the window. The patient is currently able to describe this quite well. He also seemed confused per family this AM. He also has been experiencing LLE weakness proximally and distally since his fall. The patient states that he thinks this is due to pain, but family is worried about a possible subdural hematoma since his fall 1 week ago. Neurology was consulted to  assess for possible new subdural hematoma or stroke.    Past Medical History:  Diagnosis Date  . CAD (coronary artery disease)    CABG 2008:  myoview  2010 showed no ischemia and a normal EF.  Nuclear study 11/2011: EF 55%. There was a prior inferior basal infarct but no ischemia.  07/2015 no change on repeat nuclear study.  . CVA (cerebral vascular accident) ([place])    possible/no residual deficit--following CABG   . Diverticulosis of rectosigmoid    Noted on CT 04/2011 done to eval chronic groin pain  . DVT (deep venous thrombosis) ([place]) 2009   Post-op from laparoscopic inguinal hernia repair  . Groin pain, left lower quadrant    Chronic, intermittent: believed to be due to scarring around MESH placed at inguinal hernia repair  . Hyperlipidemia   . Hypertension   .  Melanoma ([place])    skin graft surgery required  . MELANOMA, HX OF 12/02/2008  . MI (myocardial infarction) ([place]) 10/14/2006   Inferior.    . Nephrolithiasis   . Nuclear sclerotic cataract of both eyes 2015  . Osteoarthritis    Primarily hips  . Prostate nodule    multiple benign biopsies (Dr Rosana Hoes)  . Retroperitoneal hematoma 2009   on lovenox; greenfield filter placed after this    Past Surgical History:  Procedure Laterality Date  . BILATERAL ANTERIOR TOTAL HIP ARTHROPLASTY     One in 2004 and one in 2011  . brain hemorrhage     at age 79  . CARDIOVASCULAR STRESS TEST  11/2011; 07/2015   2013 Nuclear study: EF 55%. There was a prior inferior basal infarct but no ischemia.  No change 2016.  . carpal tunnel repair B Bilateral   . CATARACT EXTRACTION    . COLONOSCOPY W/ POLYPECTOMY  02/07/09   Adenomatous polyps, severe diverticulosis, internal hemorrhoids.  Repeat 2015---however, pt declines any further colon cancer screening  . greenfield filter    . HERNIA REPAIR  05/20/2008   open, X 3  . kidney stone removal  01/18/1988   x 5   . MELANOMA EXCISION  08/1990   left shoulder - skin graft from leg  . ROTATOR CUFF  REPAIR Bilateral   . status post coronary bypass graft  10/14/06    (LIMA to the LAD, saphenous vein graft to the diagonal, saphenous vein graft to the mid obtuse marginal, saphenous vein graft placed in a sequential fashion ot the RV branch of the right coronary artery and distal right coronary artery  . trigger finger X 3      Family History  Problem Relation Age of Onset  . Cancer Mother 73       uterine and ovarian   . Heart attack Father 63  . Heart disease Father   . Heart disease Brother               Social History:  reports that he has never smoked. He has never used smokeless tobacco. He reports that he does not drink alcohol and does not use drugs.  Allergies  Allergen Reactions  . Clindamycin Palmitate Hcl Itching    Difficulty breathing  . Clindamycin/Lincomycin Itching and Hives    Difficulty breathing  . Penicillins Hives and Rash  . Sulfa Antibiotics Hives  . Other Hives and Rash  . Sulfonamide Derivatives Hives and Rash    MEDICATIONS:                                                                                                                     Scheduled: . acetaminophen  650 mg Oral TID  . amLODipine  10 mg Oral Daily  . aspirin EC  81 mg Oral Daily  . furosemide  40 mg Oral Daily  . metoprolol tartrate  12.5 mg Oral BID  . polyethylene glycol  17 g Oral Daily  .  polyethylene glycol-electrolytes  4,000 mL Oral Once  . senna-docusate  1 tablet Oral BID   Continuous:    ROS:                                                                                                                                       As per HPI.   Blood pressure 121/71, pulse 76, temperature 98 F (36.7 C), temperature source Oral, resp. rate 19, height 5\' 11"  (1.803 m), weight 81.6 kg, SpO2 95 %.   General Examination:                                                                                                       Physical Exam  HEENT-  [place]/AT    Lungs-  Respirations unlabored Extremities- No edema  Neurological Examination Mental Status: Awake and alert. Oriented x 5. Minimal to mild dysarthria. Speech is fluent with intact comprehension and naming. Able to follow all commands without difficulty. Cranial Nerves: II: Visual fields intact with no extinction to DSS. PERRL.   III,IV, VI: No ptosis. EOMI with saccadic pursuits noted. No nystagmus.   V,VII: Smile symmetric, facial temp sensation equal bilaterally VIII: Hearing intact to voice IX,X: No hoarseness XI: Weak on right in the context of old rotator cuff degenerative changes XII: Midline tongue extension Motor: RUE 2/5 deltoid in a portion of ROM with 3/5 strength after raising manually to a partially extended position. Right deltoid 4-/5. Right biceps and grip 5/5 LUE 5/5 proximally and distally RLE 5/5 proximally and distally LLE with pain to left hip limiting movement and unable to lift antigravity (2/5 hip flexion). Left knee extension 5/5. Left ADF/APF 5/5.  Sensory: Intact to FT and temp in all 4 limbs Deep Tendon Reflexes: 2+ bilateral brachioradialis and biceps. 2+ patellae bilaterally. 0 achilles bilaterally. Toes downgoing bilaterally.  Cerebellar: No ataxia with FNF bilaterally  Gait: Deferred   Lab Results: Basic Metabolic Panel: Recent Labs  Lab 07/02/20 0141 07/03/20 0851 07/04/20 0451  NA 136 136 134*  K 3.5 3.5 3.6  CL 101 100 99  CO2 27 26 25   GLUCOSE 78 112* 87  BUN 14 17 14   CREATININE 1.05 1.07 1.18  CALCIUM 7.9* 8.0* 8.3*    CBC: No results for input(s): WBC, NEUTROABS, HGB, HCT, MCV, PLT in the last 168 hours.  Cardiac Enzymes: No results for input(s): CKTOTAL, CKMB, CKMBINDEX, TROPONINI in the last 168 hours.  Lipid Panel: No results for input(s): CHOL, TRIG,  HDL, CHOLHDL, VLDL, LDLCALC in the last 168 hours.  Imaging: No results found.   Assessment: 86 year old male status-post a fall 7 days ago striking head and buttocks, with new  onset hallucinations, transient episodes of confusion, slurred speech and LLE weakness.  1. Exam reveals mildly slurred speech as well as LLE weakness in a non-dermatomal/non-peripheral nerve distribution. DDx includes right high convexity subdural hematoma and right ACA stroke.  2. History of spontaneous/nontraumatic ICH approximately 50 years ago (about age 24) diagnosed clinically. Family suspects that it may have been due to a ruptured aneurysm, but he has never had vascular or structural imaging for this.  3. History of a stroke-like episode after CABG in 2008.  4. History of right rotator cuff arthroscopy about 30 years ago.   Recommendations: 1. MRI brain without contrast (ordered) 2. Plain films of L-spine and left hip (will defer to Hospitalist for ordering) 3. If MRI brain is negative, will need further imaging of his lumbar spine.   Addendum: - MRI brain completed no acute intracranial abnormality identified. There is diffuse cerebral atrophy with suggestion of disproportionate brainstem atrophy. Also noted are advanced but nonspecific cerebral white matter signal changes. A small chronic infarct is seen in the right cerebellum. - The patient has been discharged. MRI L-spine should be obtained within the next 1-2 weeks as an outpatient.  - Avoid sedating meds, as you are doing.  -Improved sleep hygiene    Electronically signed: Dr. Kerney Elbe 07/08/2020, 8:17 AM

## 2020-07-08 NOTE — TOC Progression Note (Signed)
Transition of Care [facility]) - Progression Note    Patient Details  Name: Bernard Byrd MRN: [id number] Date of Birth: October 15, 1930  Transition of Care [facility]) CM/SW [place], LCSW Phone Number: [phone] 07/08/2020, 11:22 AM  Clinical Narrative:     CSW spoke with Loie from [place] and she stated that the authorization is still pending. CSW informed her that if she hears back this weekend to please follow up with CSW.  TOC team will continue to assist with discharge planning needs.  Expected Discharge Plan: [place] Barriers to Discharge: Continued Medical Work up, Ship broker  Expected Discharge Plan and Services Expected Discharge Plan: [place] In-house Referral: Clinical Social Work     Living arrangements for the past 2 months: Single Family Home Expected Discharge Date: 07/05/20                                     Social Determinants of Health (SDOH) Interventions    Readmission Risk Interventions No flowsheet data found.

## 2020-07-08 NOTE — Significant Event (Addendum)
Rapid Response Event Note   Reason for Call :  Confusion, visual hallucinations  Initial Focused Assessment:  Pt laying in bed with eyes open, in no distress. Pt knows his name and the year but, when asked where he is, he says "they tell me I'm in the hospital but everything is backwards."  He also says there are flies on the wall-"millions of them." Family at bedside says this is not normal for him. They also say his speech sounds slurred to them. Pupils 3, equal, and brisk. Skin hot to touch.  T-98, HR-76, BP-121/71, RR-19, SpO2-95% on RA, NIH-5 for not knowing the month, and BLE weakness L > R.   Interventions:  CBG-91 CBC/CMP/Ammonia/UA MRI brain WO contrast Plan of Care:  Collect urine when able. Await lab results. MRI scan as soon as feasible. Continue to monitor closely. Dr. Avon Gully coming to bedside to assess pt. Call RRT if further assistance needed.    Event Summary:   MD Notified: Dr. Avon Gully notified and coming to bedside.  Dr. Cheral Marker to bedside to assess pt.  Call [place] [phone]  Dillard Essex, RN

## 2020-07-08 NOTE — TOC Progression Note (Signed)
Transition of Care [facility]) - Progression Note    Patient Details  Name: Bernard Byrd MRN: [id number] Date of Birth: 06-Oct-1930  Transition of Care [facility]) CM/SW [place], [place] Phone Number: [phone] 07/08/2020, 9:30 AM  Clinical Narrative:    CSW attempted to call [employer] in reference to patient's authorization. CSW awaiting a call back.  TOC team will continue to assist with discharge planning needs.   Expected Discharge Plan: [place] Barriers to Discharge: Continued Medical Work up, Ship broker  Expected Discharge Plan and Services Expected Discharge Plan: [place] In-house Referral: Clinical Social Work     Living arrangements for the past 2 months: Single Family Home Expected Discharge Date: 07/05/20                                     Social Determinants of Health (SDOH) Interventions    Readmission Risk Interventions No flowsheet data found.

## 2020-07-08 NOTE — Progress Notes (Signed)
Transported via ambulance to home

## 2020-07-08 NOTE — Discharge Summary (Signed)
Physician Discharge Summary  Bernard Byrd:[id number] DOB: 1931-05-04 DOA: 06/30/2020  PCP: Dorothyann Peng, NP  Admit date: 06/30/2020 Discharge date: 07/08/2020  Admitted From: Home Disposition: Home with hospice  Discharge Condition: Guarded CODE STATUS: DNR Diet recommendation: As tolerated  Brief/Interim Summary: Bernard Byrd is a 86 year old male with medical history significant for chronic diastolic CHF for which he is receiving home hospice care, CKD, history of CVA, HTN, HLD who presented on 11/12 after a fall with worsening shortness of breath, worsening swelling.  Increase generalized weakness, was admitted with working diagnosis of acute on chronic diastolic CHF exacerbation.  In ER, chest x-ray initially was concerning for with moderate-sized pericardial effusion, CT chest thorax showed large left pleural effusion with associated atelectasis as well as extensive upper abdominal ascites, cirrhosis.  Patient admitted as above with acute heart failure exacerbation and left-sided pleural effusion with notable abdominal ascites with worsening weakness general debility and ambulatory dysfunction.  Patient was admitted from home currently under hospice care.  Patient diuresed quite well, thoracentesis was considered but ultimately deferred in the setting of comfort.  At this time patient otherwise stable and agreeable for discharge home to continue home hospice.   Discharge Diagnoses:  Principal Problem:   Acute on chronic diastolic CHF (congestive heart failure) (HCC) Active Problems:   Hyperlipidemia   HTN (hypertension), benign   CAD (coronary artery disease)   Pleural effusion due to CHF (congestive heart failure) ([place])   Fall at home, initial encounter   '   Other cirrhosis of liver ([place])   Generalized weakness   Back pain   Pressure injury of skin   Hospice care patient    Discharge Instructions  Discharge Instructions    Change dressing (specify)   Complete by:  As directed    Dressing change: 2 times per day as necessary   Diet - low sodium heart healthy   Complete by: As directed    Increase activity slowly   Complete by: As directed    Increase activity slowly   Complete by: As directed    No wound care   Complete by: As directed      Allergies as of 07/08/2020      Reactions   Clindamycin Palmitate Hcl Itching   Difficulty breathing   Clindamycin/lincomycin Itching, Hives   Difficulty breathing   Penicillins Hives, Rash   Sulfa Antibiotics Hives   Other Hives, Rash   Sulfonamide Derivatives Hives, Rash      Medication List    STOP taking these medications   atorvastatin 10 MG tablet Commonly known as: LIPITOR   ibuprofen 800 MG tablet Commonly known as: ADVIL     TAKE these medications   amLODipine 10 MG tablet Commonly known as: NORVASC TAKE 1 TABLET BY MOUTH EVERY DAY   aspirin 81 MG EC tablet Take 81 mg by mouth daily.   docusate sodium 50 MG capsule Commonly known as: COLACE Take 100 mg by mouth 2 (two) times daily. Reported on 11/29/2015   furosemide 20 MG tablet Commonly known as: LASIX Take 2 tablets (40 mg total) by mouth daily.   metoprolol tartrate 50 MG tablet Commonly known as: LOPRESSOR TAKE 1 TABLET BY MOUTH TWICE A DAY   oxyCODONE 5 MG immediate release tablet Commonly known as: Oxy IR/ROXICODONE Take 1 tablet (5 mg total) by mouth every 4 (four) hours as needed for up to 20 doses for severe pain.   Systane Balance 0.6 % Soln Generic drug: Propylene Glycol  Apply 1 drop to eye daily as needed (dry eyes).            Discharge Care Instructions  (From admission, onward)         Start     Ordered   07/05/20 0000  Change dressing (specify)       Comments: Dressing change: 2 times per day as necessary   07/05/20 0816          Allergies  Allergen Reactions  . Clindamycin Palmitate Hcl Itching    Difficulty breathing  . Clindamycin/Lincomycin Itching and Hives    Difficulty  breathing  . Penicillins Hives and Rash  . Sulfa Antibiotics Hives  . Other Hives and Rash  . Sulfonamide Derivatives Hives and Rash    Consultations:  None   Procedures/Studies: DG Chest 2 View  Result Date: 06/30/2020 CLINICAL DATA:  Back pain after fall. EXAM: CHEST - 2 VIEW COMPARISON:  April 14, 2019. FINDINGS: Stable cardiomediastinal silhouette. Status post coronary bypass graft. Stable moderate left pleural effusion is noted with probable underlying atelectasis. Possible small right apical pneumothorax is noted. Bony thorax is unremarkable. IMPRESSION: Possible small right apical pneumothorax is noted; CT scan of the chest is recommended for further evaluation. Stable moderate left pleural effusion with probable underlying atelectasis. Electronically Signed   By: Marijo Conception M.D.   On: 06/30/2020 14:35   DG Thoracic Spine 2 View  Result Date: 06/30/2020 CLINICAL DATA:  Back pain after fall yesterday. EXAM: THORACIC SPINE 2 VIEWS COMPARISON:  None. FINDINGS: No fracture or spondylolisthesis is noted. Anterior osteophyte formation is noted at multiple levels in the lower thoracic spine. IMPRESSION: Mild multilevel degenerative changes. No acute abnormality seen in the thoracic spine. Electronically Signed   By: Marijo Conception M.D.   On: 06/30/2020 14:32   DG Lumbar Spine Complete  Result Date: 06/30/2020 CLINICAL DATA:  Low back pain after fall yesterday. EXAM: LUMBAR SPINE - COMPLETE 4+ VIEW COMPARISON:  None. FINDINGS: No fracture or significant spondylolisthesis is noted. Moderate degenerative disc disease is noted at L3-4. Mild degenerative disc disease is noted at L2-3 and L4-5. IMPRESSION: Multilevel degenerative disc disease. No acute abnormality is noted. Aortic Atherosclerosis (ICD10-I70.0). Electronically Signed   By: Marijo Conception M.D.   On: 06/30/2020 14:31   DG Pelvis 1-2 Views  Result Date: 06/30/2020 CLINICAL DATA:  Fall with right leg pain EXAM: PELVIS -  1-2 VIEW COMPARISON:  12/16/2016 FINDINGS: SI joints are non widened. Status post bilateral hip replacements with normal alignment. No definitive fracture is seen. Pubic symphysis is intact. There are extensive vascular calcifications. IMPRESSION: Status post bilateral hip replacements without acute osseous abnormality. Electronically Signed   By: Donavan Foil M.D.   On: 06/30/2020 21:34   CT Chest Wo Contrast  Result Date: 06/30/2020 CLINICAL DATA:  Back pain, fall. EXAM: CT CHEST WITHOUT CONTRAST TECHNIQUE: Multidetector CT imaging of the chest was performed following the standard protocol without IV contrast. COMPARISON:  Chest radiograph 07/01/2019 and chest CT from 12/07/2008 FINDINGS: Cardiovascular: Coronary, aortic arch, and branch vessel atherosclerotic vascular disease. Prior CABG. Mediastinum/Nodes: No pathologic adenopathy identified. Anterior to one of the bypass grafts, there is a small fluid density in the mediastinum measuring 2.3 by 0.9 cm on image 71 of series 3 which was not present on 12/07/2008, significance uncertain. This could be a low-density lymph node. Lungs/Pleura: Large left and small right pleural effusions with associated passive atelectasis. The left pleural effusion occupies 75% of left  hemithoracic volume. Biapical pleuroparenchymal scarring in the lungs. There is no pneumothorax. The appearance on recent chest radiography is due to skin folds. Upper Abdomen: Cirrhosis. Extensive upper abdominal ascites. Cystic lesions with faint marginal calcifications in the dome of the right and left hepatic lobe. Extensive edema in the upper abdominal mesentery and omentum. Aortoiliac atherosclerotic vascular disease. Prominently diminished right renal size compatible with atrophy. Hypodense lesion along the left renal pelvis, probably a peripelvic cyst. Other hypodense lesions of the left kidney are probably cysts but technically nonspecific. Musculoskeletal: Degenerative glenohumeral  arthropathy bilaterally. Prominent degenerative sternoclavicular arthropathy bilaterally. Lower cervical spondylosis and thoracic spondylosis. No well-defined fracture. IMPRESSION: 1. No well-defined fracture is identified.  No pneumothorax. 2. Large left and small right pleural effusions with associated passive atelectasis. 3. Extensive upper abdominal ascites. Extensive edema in the upper abdominal mesentery and omentum. 4. Cirrhosis. 5. Coronary, aortic arch, and branch vessel atherosclerotic vascular disease. Prior CABG. 6. Prominently diminished right renal size compatible with atrophy. 7. Hypodense lesions of the left kidney are probably cysts but technically nonspecific. 8. Small fluid density in the mediastinum anterior to one of the bypass grafts, significance uncertain. Possibly a small low-density lymph node. 9. Degenerative glenohumeral arthropathy bilaterally. Prominent degenerative sternoclavicular arthropathy bilaterally. 10. Aortic atherosclerosis. Aortic Atherosclerosis (ICD10-I70.0). Electronically Signed   By: Van Clines M.D.   On: 06/30/2020 18:57   MR BRAIN WO CONTRAST  Result Date: 07/08/2020 CLINICAL DATA:  85 year old male with left lower extremity weakness, hallucinations. Fall 7 days ago. Query ACA ischemia. EXAM: MRI HEAD WITHOUT CONTRAST TECHNIQUE: Multiplanar, multiecho pulse sequences of the brain and surrounding structures were obtained without intravenous contrast. COMPARISON:  None. FINDINGS: Study is intermittently degraded by motion artifact despite repeated imaging attempts. Brain: No convincing restricted diffusion. Small chronic lacunar infarct in the right cerebellum PICA territory (series 5, image 5). Generalized patchy and confluent bilateral cerebral white matter T2 and FLAIR hyperintensity, somewhat indistinct in both hemispheres. The deep gray nuclei and brainstem remain within normal limits for age. No cortical encephalomalacia or chronic cerebral blood  products identified. Suggestion of mid brain volume loss on the sagittal (series 4, image 13). No other disproportionate areas of cerebral volume loss identified. No midline shift, mass effect, evidence of mass lesion, ventriculomegaly, extra-axial collection or acute intracranial hemorrhage. Cervicomedullary junction and pituitary are within normal limits. Vascular: Major intracranial vascular flow voids are preserved. Dominant left vertebral artery and mild generalized intracranial artery tortuosity. Skull and upper cervical spine: Grossly negative cervical spine, degraded by motion. Visualized bone marrow signal is within normal limits. Sinuses/Orbits: Postoperative changes to both globes, otherwise negative. Other: Mastoids are clear. Grossly normal visible internal auditory structures. Normal stylomastoid foramina. Negative visible scalp and face soft tissues. IMPRESSION: 1. No acute intracranial abnormality identified. Mildly motion degraded despite repeated imaging attempts. 2. Suggestion of disproportionate brainstem atrophy. Advanced but nonspecific cerebral white matter signal changes. Small chronic infarct in the right cerebellum. Electronically Signed   By: Genevie Ann M.D.   On: 07/08/2020 10:58   DG Femur Min 2 Views Right  Result Date: 06/30/2020 CLINICAL DATA:  Fall, right leg pain, EXAM: RIGHT FEMUR 2 VIEWS COMPARISON:  None. FINDINGS: Two view radiograph right femur demonstrates surgical changes of right total hip arthroplasty. Arthroplasty components are in anatomic alignment. No fracture or dislocation. No lytic or blastic bone lesion. Advanced vascular calcifications are seen within the medial soft tissues. IMPRESSION: No acute fracture or dislocation. Electronically Signed   By: Fidela Salisbury MD  On: 06/30/2020 21:44      Subjective: Mild confusion this morning, MRI negative, labs unremarkable, denies headache, fever, chills, nausea, vomiting, diarrhea, constipation.   Discharge  Exam: Vitals:   07/07/20 2121 07/08/20 0636  BP: 119/61 121/71  Pulse: 74 76  Resp: 18 19  Temp: 98.6 F (37 C) 98 F (36.7 C)  SpO2: 92% 95%   Vitals:   07/07/20 1135 07/07/20 2121 07/08/20 0636 07/08/20 0700  BP: 119/66 119/61 121/71   Pulse: 64 74 76   Resp: 16 18 19    Temp: 98.1 F (36.7 C) 98.6 F (37 C) 98 F (36.7 C)   TempSrc: Oral Oral Oral   SpO2: 93% 92% 95%   Weight:    81.6 kg  Height:        General: Pt is alert, awake, not in acute distress Cardiovascular: RRR, S1/S2 +, no rubs, no gallops Respiratory: CTA bilaterally, no wheezing, no rhonchi Abdominal: Soft, NT, ND, bowel sounds + Extremities: no edema, no cyanosis    The results of significant diagnostics from this hospitalization (including imaging, microbiology, ancillary and laboratory) are listed below for reference.     Microbiology: Recent Results (from the past 240 hour(s))  Resp Panel by RT PCR (RSV, Flu A&B, Covid) - Nasopharyngeal Swab     Status: None   Collection Time: 06/30/20  2:53 PM   Specimen: Nasopharyngeal Swab  Result Value Ref Range Status   SARS Coronavirus 2 by RT PCR NEGATIVE NEGATIVE Final    Comment: (NOTE) SARS-CoV-2 target nucleic acids are NOT DETECTED.  The SARS-CoV-2 RNA is generally detectable in upper respiratoy specimens during the acute phase of infection. The lowest concentration of SARS-CoV-2 viral copies this assay can detect is 131 copies/mL. A negative result does not preclude SARS-Cov-2 infection and should not be used as the sole basis for treatment or other patient management decisions. A negative result may occur with  improper specimen collection/handling, submission of specimen other than nasopharyngeal swab, presence of viral mutation(s) within the areas targeted by this assay, and inadequate number of viral copies (<131 copies/mL). A negative result must be combined with clinical observations, patient history, and epidemiological information.  The expected result is Negative.  Fact Sheet for Patients:  PinkCheek.be  Fact Sheet for Healthcare Providers:  GravelBags.it  This test is no t yet approved or cleared by the [place] FDA and  has been authorized for detection and/or diagnosis of SARS-CoV-2 by FDA under an Emergency Use Authorization (EUA). This EUA will remain  in effect (meaning this test can be used) for the duration of the COVID-19 declaration under Section 564(b)(1) of the Act, 21 U.S.C. section 360bbb-3(b)(1), unless the authorization is terminated or revoked sooner.     Influenza A by PCR NEGATIVE NEGATIVE Final   Influenza B by PCR NEGATIVE NEGATIVE Final    Comment: (NOTE) The Xpert Xpress SARS-CoV-2/FLU/RSV assay is intended as an aid in  the diagnosis of influenza from Nasopharyngeal swab specimens and  should not be used as a sole basis for treatment. Nasal washings and  aspirates are unacceptable for Xpert Xpress SARS-CoV-2/FLU/RSV  testing.  Fact Sheet for Patients: PinkCheek.be  Fact Sheet for Healthcare Providers: GravelBags.it  This test is not yet approved or cleared by the [place] FDA and  has been authorized for detection and/or diagnosis of SARS-CoV-2 by  FDA under an Emergency Use Authorization (EUA). This EUA will remain  in effect (meaning this test can be used) for the duration of  the  Covid-19 declaration under Section 564(b)(1) of the Act, 21  U.S.C. section 360bbb-3(b)(1), unless the authorization is  terminated or revoked.    Respiratory Syncytial Virus by PCR NEGATIVE NEGATIVE Final    Comment: (NOTE) Fact Sheet for Patients: PinkCheek.be  Fact Sheet for Healthcare Providers: GravelBags.it  This test is not yet approved or cleared by the [place] FDA and  has been authorized for  detection and/or diagnosis of SARS-CoV-2 by  FDA under an Emergency Use Authorization (EUA). This EUA will remain  in effect (meaning this test can be used) for the duration of the  COVID-19 declaration under Section 564(b)(1) of the Act, 21 U.S.C.  section 360bbb-3(b)(1), unless the authorization is terminated or  revoked. Performed at [facility], [place] [address] [place], [place] [address]   SARS Coronavirus 2 by RT PCR (hospital order, performed in [facility] hospital lab) Nasopharyngeal Nasopharyngeal Swab     Status: None   Collection Time: 07/05/20  6:25 PM   Specimen: Nasopharyngeal Swab  Result Value Ref Range Status   SARS Coronavirus 2 NEGATIVE NEGATIVE Final    Comment: (NOTE) SARS-CoV-2 target nucleic acids are NOT DETECTED.  The SARS-CoV-2 RNA is generally detectable in upper and lower respiratory specimens during the acute phase of infection. The lowest concentration of SARS-CoV-2 viral copies this assay can detect is 250 copies / mL. A negative result does not preclude SARS-CoV-2 infection and should not be used as the sole basis for treatment or other patient management decisions.  A negative result may occur with improper specimen collection / handling, submission of specimen other than nasopharyngeal swab, presence of viral mutation(s) within the areas targeted by this assay, and inadequate number of viral copies (<250 copies / mL). A negative result must be combined with clinical observations, patient history, and epidemiological information.  Fact Sheet for Patients:   StrictlyIdeas.no  Fact Sheet for Healthcare Providers: BankingDealers.co.za  This test is not yet approved or  cleared by the [place] FDA and has been authorized for detection and/or diagnosis of SARS-CoV-2 by FDA under an Emergency Use Authorization (EUA).  This EUA will remain in effect (meaning this test can be used) for the  duration of the COVID-19 declaration under Section 564(b)(1) of the Act, 21 U.S.C. section 360bbb-3(b)(1), unless the authorization is terminated or revoked sooner.  Performed at [facility], [place] [address], [place], [place] [address]      Labs: BNP (last 3 results) Recent Labs    06/30/20 1439  BNP 384.6*   Basic Metabolic Panel: Recent Labs  Lab 07/02/20 0141 07/03/20 0851 07/04/20 0451  NA 136 136 134*  K 3.5 3.5 3.6  CL 101 100 99  CO2 27 26 25   GLUCOSE 78 112* 87  BUN 14 17 14   CREATININE 1.05 1.07 1.18  CALCIUM 7.9* 8.0* 8.3*   Liver Function Tests: No results for input(s): AST, ALT, ALKPHOS, BILITOT, PROT, ALBUMIN in the last 168 hours. No results for input(s): LIPASE, AMYLASE in the last 168 hours. Recent Labs  Lab 07/08/20 0838  AMMONIA 26   CBC: No results for input(s): WBC, NEUTROABS, HGB, HCT, MCV, PLT in the last 168 hours. Cardiac Enzymes: No results for input(s): CKTOTAL, CKMB, CKMBINDEX, TROPONINI in the last 168 hours. BNP: Invalid input(s): POCBNP CBG: Recent Labs  Lab 07/08/20 0711  GLUCAP 91   D-Dimer No results for input(s): DDIMER in the last 72 hours. Hgb A1c No results for input(s): HGBA1C in  the last 72 hours. Lipid Profile No results for input(s): CHOL, HDL, LDLCALC, Bernard, CHOLHDL, LDLDIRECT in the last 72 hours. Thyroid function studies No results for input(s): TSH, T4TOTAL, T3FREE, THYROIDAB in the last 72 hours.  Invalid input(s): FREET3 Anemia work up No results for input(s): VITAMINB12, FOLATE, FERRITIN, TIBC, IRON, RETICCTPCT in the last 72 hours. Urinalysis    Component Value Date/Time   COLORURINE YELLOW 07/08/2020 0808   APPEARANCEUR CLOUDY (A) 07/08/2020 0808   LABSPEC 1.013 07/08/2020 0808   PHURINE 8.0 07/08/2020 0808   GLUCOSEU NEGATIVE 07/08/2020 0808   HGBUR NEGATIVE 07/08/2020 0808   BILIRUBINUR NEGATIVE 07/08/2020 0808   KETONESUR NEGATIVE 07/08/2020 0808   PROTEINUR NEGATIVE 07/08/2020 0808    UROBILINOGEN 0.2 08/06/2014 2345   NITRITE POSITIVE (A) 07/08/2020 0808   LEUKOCYTESUR NEGATIVE 07/08/2020 0808   Sepsis Labs Invalid input(s): PROCALCITONIN,  WBC,  LACTICIDVEN Microbiology Recent Results (from the past 240 hour(s))  Resp Panel by RT PCR (RSV, Flu A&B, Covid) - Nasopharyngeal Swab     Status: None   Collection Time: 06/30/20  2:53 PM   Specimen: Nasopharyngeal Swab  Result Value Ref Range Status   SARS Coronavirus 2 by RT PCR NEGATIVE NEGATIVE Final    Comment: (NOTE) SARS-CoV-2 target nucleic acids are NOT DETECTED.  The SARS-CoV-2 RNA is generally detectable in upper respiratoy specimens during the acute phase of infection. The lowest concentration of SARS-CoV-2 viral copies this assay can detect is 131 copies/mL. A negative result does not preclude SARS-Cov-2 infection and should not be used as the sole basis for treatment or other patient management decisions. A negative result may occur with  improper specimen collection/handling, submission of specimen other than nasopharyngeal swab, presence of viral mutation(s) within the areas targeted by this assay, and inadequate number of viral copies (<131 copies/mL). A negative result must be combined with clinical observations, patient history, and epidemiological information. The expected result is Negative.  Fact Sheet for Patients:  PinkCheek.be  Fact Sheet for Healthcare Providers:  GravelBags.it  This test is no t yet approved or cleared by the [place] FDA and  has been authorized for detection and/or diagnosis of SARS-CoV-2 by FDA under an Emergency Use Authorization (EUA). This EUA will remain  in effect (meaning this test can be used) for the duration of the COVID-19 declaration under Section 564(b)(1) of the Act, 21 U.S.C. section 360bbb-3(b)(1), unless the authorization is terminated or revoked sooner.     Influenza A by PCR  NEGATIVE NEGATIVE Final   Influenza B by PCR NEGATIVE NEGATIVE Final    Comment: (NOTE) The Xpert Xpress SARS-CoV-2/FLU/RSV assay is intended as an aid in  the diagnosis of influenza from Nasopharyngeal swab specimens and  should not be used as a sole basis for treatment. Nasal washings and  aspirates are unacceptable for Xpert Xpress SARS-CoV-2/FLU/RSV  testing.  Fact Sheet for Patients: PinkCheek.be  Fact Sheet for Healthcare Providers: GravelBags.it  This test is not yet approved or cleared by the [place] FDA and  has been authorized for detection and/or diagnosis of SARS-CoV-2 by  FDA under an Emergency Use Authorization (EUA). This EUA will remain  in effect (meaning this test can be used) for the duration of the  Covid-19 declaration under Section 564(b)(1) of the Act, 21  U.S.C. section 360bbb-3(b)(1), unless the authorization is  terminated or revoked.    Respiratory Syncytial Virus by PCR NEGATIVE NEGATIVE Final    Comment: (NOTE) Fact Sheet for Patients: PinkCheek.be  Fact Sheet  for Healthcare Providers: GravelBags.it  This test is not yet approved or cleared by the [place] and  has been authorized for detection and/or diagnosis of SARS-CoV-2 by  FDA under an Emergency Use Authorization (EUA). This EUA will remain  in effect (meaning this test can be used) for the duration of the  COVID-19 declaration under Section 564(b)(1) of the Act, 21 U.S.C.  section 360bbb-3(b)(1), unless the authorization is terminated or  revoked. Performed at [facility], [place] [address] [place], [place] [address]   SARS Coronavirus 2 by RT PCR (hospital order, performed in [facility] hospital lab) Nasopharyngeal Nasopharyngeal Swab     Status: None   Collection Time: 07/05/20  6:25 PM   Specimen: Nasopharyngeal Swab  Result Value Ref Range Status    SARS Coronavirus 2 NEGATIVE NEGATIVE Final    Comment: (NOTE) SARS-CoV-2 target nucleic acids are NOT DETECTED.  The SARS-CoV-2 RNA is generally detectable in upper and lower respiratory specimens during the acute phase of infection. The lowest concentration of SARS-CoV-2 viral copies this assay can detect is 250 copies / mL. A negative result does not preclude SARS-CoV-2 infection and should not be used as the sole basis for treatment or other patient management decisions.  A negative result may occur with improper specimen collection / handling, submission of specimen other than nasopharyngeal swab, presence of viral mutation(s) within the areas targeted by this assay, and inadequate number of viral copies (<250 copies / mL). A negative result must be combined with clinical observations, patient history, and epidemiological information.  Fact Sheet for Patients:   StrictlyIdeas.no  Fact Sheet for Healthcare Providers: BankingDealers.co.za  This test is not yet approved or  cleared by the [place] FDA and has been authorized for detection and/or diagnosis of SARS-CoV-2 by FDA under an Emergency Use Authorization (EUA).  This EUA will remain in effect (meaning this test can be used) for the duration of the COVID-19 declaration under Section 564(b)(1) of the Act, 21 U.S.C. section 360bbb-3(b)(1), unless the authorization is terminated or revoked sooner.  Performed at [facility], [place] [address], [place], [place] [address]      Time coordinating discharge: Over 30 minutes  SIGNED:   Little Ishikawa, DO [facility] 07/08/2020, 2:02 PM Pager   If 7PM-7AM, please contact night-coverage [email or web address]

## 2020-07-08 NOTE — Progress Notes (Addendum)
[facility]) Hospital Liaison Note      This patient continues to be an active hospice patient with [facility].  [facility] will continue to follow for any discharge planning needs and to coordinate continuation of hospice care.    At time of discharge please use [facility] for transport, as we contract with them for our active hospice patients.  If you have questions or need assistance, please call contact the hospital Liaison listed on AMION.     Thank you for the opportunity to participate in this patient's care.     Domenic Moras, BSN, RN [facility] Liaison   [phone]

## 2020-07-08 NOTE — Plan of Care (Signed)

## 2020-07-08 NOTE — Progress Notes (Signed)
Pt woke up this morning confused, he called his daughter [name] and told about his confusion, daughter called desk to inform staff. Primary nurse went to pt room and did assessment, pt disoriented to self, place, time and situation, pt was reoriented and was easily oriented to self and place. Pt verbalized seeing "millions of flies". Vital signs taken, within normal limits, neuro check done WNL. Pt still have off and on confusion, rapid response nurse Mindy made aware. Will continue to monitor.

## 2020-07-08 NOTE — TOC Transition Note (Addendum)
Transition of Care [facility]) - CM/SW Discharge Note   Patient Details  Name: Bernard Byrd MRN: [id number] Date of Birth: Dec 28, 1930  Transition of Care [facility]) CM/SW Contact:  Carles Collet, RN Phone Number: 07/08/2020, 12:23 PM   Clinical Narrative:   Damaris Schooner w daughter at bedside. She states that they want to take the patient home, does not feel there is chance for substantial gain to PLOF to necessitate SNF. Patient was active w [facility]) for Home Hospice PTA. Daughter speaking w liaison Chrislyn Edison Pace over the phone when I came to the room. Daugher states that they have all needed DME at home including hospital bed and oxygen. Patient will require [facility] transport home. Sent message to [place] to notify of DC. Daughter would like DC ASAP, will notify MD. Corey Harold forms printed and given to Network engineer.  CM will call [facility] once contact is made w [facility].  12:35 [place] liaison confirmed plans to DC today. Gold DNR form at RN station, MD aware it needs signed.   14:00 Notified by RN that DNR signed, [facility] called for transport    Final next level of care: Home w Hospice Care Barriers to Discharge: No Barriers Identified   Patient Goals and CMS Choice Patient states their goals for this hospitalization and ongoing recovery are:: to go home CMS Medicare.gov Compare Post Acute Care list provided to:: Other (Comment Required) Choice offered to / list presented to : Adult Children  Discharge Placement                       Discharge Plan and Services In-house Referral: Clinical Social Work                          [facility] Agency: Hospice and [place] Date [place]: 07/08/20 Time [place]: 1223 Representative spoke with at [place]: [place] ([place]) Interventions     Readmission Risk Interventions No flowsheet data found.

## 2020-07-08 NOTE — Progress Notes (Signed)
Rapid response at bedside, family at bedside. Dr. Avon Gully notified, orders made. Report given to oncoming day RN.

## 2020-07-09 DIAGNOSIS — I110 Hypertensive heart disease with heart failure: Secondary | ICD-10-CM | POA: Diagnosis not present

## 2020-07-09 DIAGNOSIS — I509 Heart failure, unspecified: Secondary | ICD-10-CM | POA: Diagnosis not present

## 2020-07-09 DIAGNOSIS — R630 Anorexia: Secondary | ICD-10-CM | POA: Diagnosis not present

## 2020-07-09 DIAGNOSIS — I252 Old myocardial infarction: Secondary | ICD-10-CM | POA: Diagnosis not present

## 2020-07-09 DIAGNOSIS — J90 Pleural effusion, not elsewhere classified: Secondary | ICD-10-CM | POA: Diagnosis not present

## 2020-07-09 DIAGNOSIS — R634 Abnormal weight loss: Secondary | ICD-10-CM | POA: Diagnosis not present

## 2020-07-10 DIAGNOSIS — I252 Old myocardial infarction: Secondary | ICD-10-CM | POA: Diagnosis not present

## 2020-07-10 DIAGNOSIS — I509 Heart failure, unspecified: Secondary | ICD-10-CM | POA: Diagnosis not present

## 2020-07-10 DIAGNOSIS — J90 Pleural effusion, not elsewhere classified: Secondary | ICD-10-CM | POA: Diagnosis not present

## 2020-07-10 DIAGNOSIS — R634 Abnormal weight loss: Secondary | ICD-10-CM | POA: Diagnosis not present

## 2020-07-10 DIAGNOSIS — I110 Hypertensive heart disease with heart failure: Secondary | ICD-10-CM | POA: Diagnosis not present

## 2020-07-10 DIAGNOSIS — R630 Anorexia: Secondary | ICD-10-CM | POA: Diagnosis not present

## 2020-07-11 DIAGNOSIS — I110 Hypertensive heart disease with heart failure: Secondary | ICD-10-CM | POA: Diagnosis not present

## 2020-07-11 DIAGNOSIS — I252 Old myocardial infarction: Secondary | ICD-10-CM | POA: Diagnosis not present

## 2020-07-11 DIAGNOSIS — I509 Heart failure, unspecified: Secondary | ICD-10-CM | POA: Diagnosis not present

## 2020-07-11 DIAGNOSIS — J90 Pleural effusion, not elsewhere classified: Secondary | ICD-10-CM | POA: Diagnosis not present

## 2020-07-11 DIAGNOSIS — R630 Anorexia: Secondary | ICD-10-CM | POA: Diagnosis not present

## 2020-07-11 DIAGNOSIS — R634 Abnormal weight loss: Secondary | ICD-10-CM | POA: Diagnosis not present

## 2020-07-12 DIAGNOSIS — J90 Pleural effusion, not elsewhere classified: Secondary | ICD-10-CM | POA: Diagnosis not present

## 2020-07-12 DIAGNOSIS — M6281 Muscle weakness (generalized): Secondary | ICD-10-CM | POA: Diagnosis not present

## 2020-07-12 DIAGNOSIS — I252 Old myocardial infarction: Secondary | ICD-10-CM | POA: Diagnosis not present

## 2020-07-12 DIAGNOSIS — R630 Anorexia: Secondary | ICD-10-CM | POA: Diagnosis not present

## 2020-07-12 DIAGNOSIS — I509 Heart failure, unspecified: Secondary | ICD-10-CM | POA: Diagnosis not present

## 2020-07-12 DIAGNOSIS — I110 Hypertensive heart disease with heart failure: Secondary | ICD-10-CM | POA: Diagnosis not present

## 2020-07-12 DIAGNOSIS — R634 Abnormal weight loss: Secondary | ICD-10-CM | POA: Diagnosis not present

## 2020-07-12 DIAGNOSIS — L89159 Pressure ulcer of sacral region, unspecified stage: Secondary | ICD-10-CM | POA: Diagnosis not present

## 2020-07-12 DIAGNOSIS — Z7902 Long term (current) use of antithrombotics/antiplatelets: Secondary | ICD-10-CM | POA: Diagnosis not present

## 2020-07-14 DIAGNOSIS — I252 Old myocardial infarction: Secondary | ICD-10-CM | POA: Diagnosis not present

## 2020-07-14 DIAGNOSIS — R634 Abnormal weight loss: Secondary | ICD-10-CM | POA: Diagnosis not present

## 2020-07-14 DIAGNOSIS — I110 Hypertensive heart disease with heart failure: Secondary | ICD-10-CM | POA: Diagnosis not present

## 2020-07-14 DIAGNOSIS — J90 Pleural effusion, not elsewhere classified: Secondary | ICD-10-CM | POA: Diagnosis not present

## 2020-07-14 DIAGNOSIS — R630 Anorexia: Secondary | ICD-10-CM | POA: Diagnosis not present

## 2020-07-14 DIAGNOSIS — I509 Heart failure, unspecified: Secondary | ICD-10-CM | POA: Diagnosis not present

## 2020-07-17 DIAGNOSIS — R634 Abnormal weight loss: Secondary | ICD-10-CM | POA: Diagnosis not present

## 2020-07-17 DIAGNOSIS — I252 Old myocardial infarction: Secondary | ICD-10-CM | POA: Diagnosis not present

## 2020-07-17 DIAGNOSIS — I509 Heart failure, unspecified: Secondary | ICD-10-CM | POA: Diagnosis not present

## 2020-07-17 DIAGNOSIS — I110 Hypertensive heart disease with heart failure: Secondary | ICD-10-CM | POA: Diagnosis not present

## 2020-07-17 DIAGNOSIS — R630 Anorexia: Secondary | ICD-10-CM | POA: Diagnosis not present

## 2020-07-17 DIAGNOSIS — J90 Pleural effusion, not elsewhere classified: Secondary | ICD-10-CM | POA: Diagnosis not present

## 2020-07-19 DIAGNOSIS — I25709 Atherosclerosis of coronary artery bypass graft(s), unspecified, with unspecified angina pectoris: Secondary | ICD-10-CM | POA: Diagnosis not present

## 2020-07-19 DIAGNOSIS — I080 Rheumatic disorders of both mitral and aortic valves: Secondary | ICD-10-CM | POA: Diagnosis not present

## 2020-07-19 DIAGNOSIS — I69318 Other symptoms and signs involving cognitive functions following cerebral infarction: Secondary | ICD-10-CM | POA: Diagnosis not present

## 2020-07-19 DIAGNOSIS — R630 Anorexia: Secondary | ICD-10-CM | POA: Diagnosis not present

## 2020-07-19 DIAGNOSIS — R634 Abnormal weight loss: Secondary | ICD-10-CM | POA: Diagnosis not present

## 2020-07-19 DIAGNOSIS — I110 Hypertensive heart disease with heart failure: Secondary | ICD-10-CM | POA: Diagnosis not present

## 2020-07-19 DIAGNOSIS — E785 Hyperlipidemia, unspecified: Secondary | ICD-10-CM | POA: Diagnosis not present

## 2020-07-19 DIAGNOSIS — I252 Old myocardial infarction: Secondary | ICD-10-CM | POA: Diagnosis not present

## 2020-07-19 DIAGNOSIS — I509 Heart failure, unspecified: Secondary | ICD-10-CM | POA: Diagnosis not present

## 2020-07-19 DIAGNOSIS — J90 Pleural effusion, not elsewhere classified: Secondary | ICD-10-CM | POA: Diagnosis not present

## 2020-07-20 DIAGNOSIS — R634 Abnormal weight loss: Secondary | ICD-10-CM | POA: Diagnosis not present

## 2020-07-20 DIAGNOSIS — I252 Old myocardial infarction: Secondary | ICD-10-CM | POA: Diagnosis not present

## 2020-07-20 DIAGNOSIS — R630 Anorexia: Secondary | ICD-10-CM | POA: Diagnosis not present

## 2020-07-20 DIAGNOSIS — I509 Heart failure, unspecified: Secondary | ICD-10-CM | POA: Diagnosis not present

## 2020-07-20 DIAGNOSIS — J90 Pleural effusion, not elsewhere classified: Secondary | ICD-10-CM | POA: Diagnosis not present

## 2020-07-20 DIAGNOSIS — I110 Hypertensive heart disease with heart failure: Secondary | ICD-10-CM | POA: Diagnosis not present

## 2020-07-21 DIAGNOSIS — I252 Old myocardial infarction: Secondary | ICD-10-CM | POA: Diagnosis not present

## 2020-07-21 DIAGNOSIS — I110 Hypertensive heart disease with heart failure: Secondary | ICD-10-CM | POA: Diagnosis not present

## 2020-07-21 DIAGNOSIS — R630 Anorexia: Secondary | ICD-10-CM | POA: Diagnosis not present

## 2020-07-21 DIAGNOSIS — J90 Pleural effusion, not elsewhere classified: Secondary | ICD-10-CM | POA: Diagnosis not present

## 2020-07-21 DIAGNOSIS — R634 Abnormal weight loss: Secondary | ICD-10-CM | POA: Diagnosis not present

## 2020-07-21 DIAGNOSIS — I509 Heart failure, unspecified: Secondary | ICD-10-CM | POA: Diagnosis not present

## 2020-07-25 ENCOUNTER — Telehealth: Payer: Self-pay | Admitting: Adult Health

## 2020-07-25 DIAGNOSIS — I110 Hypertensive heart disease with heart failure: Secondary | ICD-10-CM | POA: Diagnosis not present

## 2020-07-25 DIAGNOSIS — R630 Anorexia: Secondary | ICD-10-CM | POA: Diagnosis not present

## 2020-07-25 DIAGNOSIS — R634 Abnormal weight loss: Secondary | ICD-10-CM | POA: Diagnosis not present

## 2020-07-25 DIAGNOSIS — J90 Pleural effusion, not elsewhere classified: Secondary | ICD-10-CM | POA: Diagnosis not present

## 2020-07-25 DIAGNOSIS — I509 Heart failure, unspecified: Secondary | ICD-10-CM | POA: Diagnosis not present

## 2020-07-25 DIAGNOSIS — I252 Old myocardial infarction: Secondary | ICD-10-CM | POA: Diagnosis not present

## 2020-07-25 NOTE — Telephone Encounter (Signed)
Patient daughter is calling and stated that she is returning the call, please advise. CB is [phone]

## 2020-07-28 DIAGNOSIS — I110 Hypertensive heart disease with heart failure: Secondary | ICD-10-CM | POA: Diagnosis not present

## 2020-07-28 DIAGNOSIS — R634 Abnormal weight loss: Secondary | ICD-10-CM | POA: Diagnosis not present

## 2020-07-28 DIAGNOSIS — R630 Anorexia: Secondary | ICD-10-CM | POA: Diagnosis not present

## 2020-07-28 DIAGNOSIS — I252 Old myocardial infarction: Secondary | ICD-10-CM | POA: Diagnosis not present

## 2020-07-28 DIAGNOSIS — I509 Heart failure, unspecified: Secondary | ICD-10-CM | POA: Diagnosis not present

## 2020-07-28 DIAGNOSIS — J90 Pleural effusion, not elsewhere classified: Secondary | ICD-10-CM | POA: Diagnosis not present

## 2020-07-31 DIAGNOSIS — R634 Abnormal weight loss: Secondary | ICD-10-CM | POA: Diagnosis not present

## 2020-07-31 DIAGNOSIS — I110 Hypertensive heart disease with heart failure: Secondary | ICD-10-CM | POA: Diagnosis not present

## 2020-07-31 DIAGNOSIS — J90 Pleural effusion, not elsewhere classified: Secondary | ICD-10-CM | POA: Diagnosis not present

## 2020-07-31 DIAGNOSIS — I252 Old myocardial infarction: Secondary | ICD-10-CM | POA: Diagnosis not present

## 2020-07-31 DIAGNOSIS — I509 Heart failure, unspecified: Secondary | ICD-10-CM | POA: Diagnosis not present

## 2020-07-31 DIAGNOSIS — R630 Anorexia: Secondary | ICD-10-CM | POA: Diagnosis not present

## 2020-08-01 DIAGNOSIS — R630 Anorexia: Secondary | ICD-10-CM | POA: Diagnosis not present

## 2020-08-01 DIAGNOSIS — R634 Abnormal weight loss: Secondary | ICD-10-CM | POA: Diagnosis not present

## 2020-08-01 DIAGNOSIS — J90 Pleural effusion, not elsewhere classified: Secondary | ICD-10-CM | POA: Diagnosis not present

## 2020-08-01 DIAGNOSIS — I110 Hypertensive heart disease with heart failure: Secondary | ICD-10-CM | POA: Diagnosis not present

## 2020-08-01 DIAGNOSIS — I252 Old myocardial infarction: Secondary | ICD-10-CM | POA: Diagnosis not present

## 2020-08-01 DIAGNOSIS — I509 Heart failure, unspecified: Secondary | ICD-10-CM | POA: Diagnosis not present

## 2020-08-02 DIAGNOSIS — I252 Old myocardial infarction: Secondary | ICD-10-CM | POA: Diagnosis not present

## 2020-08-02 DIAGNOSIS — I509 Heart failure, unspecified: Secondary | ICD-10-CM | POA: Diagnosis not present

## 2020-08-02 DIAGNOSIS — I110 Hypertensive heart disease with heart failure: Secondary | ICD-10-CM | POA: Diagnosis not present

## 2020-08-02 DIAGNOSIS — R634 Abnormal weight loss: Secondary | ICD-10-CM | POA: Diagnosis not present

## 2020-08-02 DIAGNOSIS — R630 Anorexia: Secondary | ICD-10-CM | POA: Diagnosis not present

## 2020-08-02 DIAGNOSIS — J90 Pleural effusion, not elsewhere classified: Secondary | ICD-10-CM | POA: Diagnosis not present

## 2020-08-04 DIAGNOSIS — I252 Old myocardial infarction: Secondary | ICD-10-CM | POA: Diagnosis not present

## 2020-08-04 DIAGNOSIS — J90 Pleural effusion, not elsewhere classified: Secondary | ICD-10-CM | POA: Diagnosis not present

## 2020-08-04 DIAGNOSIS — I110 Hypertensive heart disease with heart failure: Secondary | ICD-10-CM | POA: Diagnosis not present

## 2020-08-04 DIAGNOSIS — I509 Heart failure, unspecified: Secondary | ICD-10-CM | POA: Diagnosis not present

## 2020-08-04 DIAGNOSIS — R634 Abnormal weight loss: Secondary | ICD-10-CM | POA: Diagnosis not present

## 2020-08-04 DIAGNOSIS — R630 Anorexia: Secondary | ICD-10-CM | POA: Diagnosis not present

## 2020-08-06 DIAGNOSIS — J90 Pleural effusion, not elsewhere classified: Secondary | ICD-10-CM | POA: Diagnosis not present

## 2020-08-06 DIAGNOSIS — I110 Hypertensive heart disease with heart failure: Secondary | ICD-10-CM | POA: Diagnosis not present

## 2020-08-06 DIAGNOSIS — I252 Old myocardial infarction: Secondary | ICD-10-CM | POA: Diagnosis not present

## 2020-08-06 DIAGNOSIS — R634 Abnormal weight loss: Secondary | ICD-10-CM | POA: Diagnosis not present

## 2020-08-06 DIAGNOSIS — I509 Heart failure, unspecified: Secondary | ICD-10-CM | POA: Diagnosis not present

## 2020-08-06 DIAGNOSIS — R630 Anorexia: Secondary | ICD-10-CM | POA: Diagnosis not present

## 2020-08-07 DIAGNOSIS — I110 Hypertensive heart disease with heart failure: Secondary | ICD-10-CM | POA: Diagnosis not present

## 2020-08-07 DIAGNOSIS — I252 Old myocardial infarction: Secondary | ICD-10-CM | POA: Diagnosis not present

## 2020-08-07 DIAGNOSIS — R630 Anorexia: Secondary | ICD-10-CM | POA: Diagnosis not present

## 2020-08-07 DIAGNOSIS — R634 Abnormal weight loss: Secondary | ICD-10-CM | POA: Diagnosis not present

## 2020-08-07 DIAGNOSIS — I509 Heart failure, unspecified: Secondary | ICD-10-CM | POA: Diagnosis not present

## 2020-08-07 DIAGNOSIS — J90 Pleural effusion, not elsewhere classified: Secondary | ICD-10-CM | POA: Diagnosis not present

## 2020-08-08 DIAGNOSIS — R630 Anorexia: Secondary | ICD-10-CM | POA: Diagnosis not present

## 2020-08-08 DIAGNOSIS — I509 Heart failure, unspecified: Secondary | ICD-10-CM | POA: Diagnosis not present

## 2020-08-08 DIAGNOSIS — I252 Old myocardial infarction: Secondary | ICD-10-CM | POA: Diagnosis not present

## 2020-08-08 DIAGNOSIS — J90 Pleural effusion, not elsewhere classified: Secondary | ICD-10-CM | POA: Diagnosis not present

## 2020-08-08 DIAGNOSIS — R634 Abnormal weight loss: Secondary | ICD-10-CM | POA: Diagnosis not present

## 2020-08-08 DIAGNOSIS — I110 Hypertensive heart disease with heart failure: Secondary | ICD-10-CM | POA: Diagnosis not present

## 2020-08-09 DIAGNOSIS — R630 Anorexia: Secondary | ICD-10-CM | POA: Diagnosis not present

## 2020-08-09 DIAGNOSIS — I252 Old myocardial infarction: Secondary | ICD-10-CM | POA: Diagnosis not present

## 2020-08-09 DIAGNOSIS — I110 Hypertensive heart disease with heart failure: Secondary | ICD-10-CM | POA: Diagnosis not present

## 2020-08-09 DIAGNOSIS — J90 Pleural effusion, not elsewhere classified: Secondary | ICD-10-CM | POA: Diagnosis not present

## 2020-08-09 DIAGNOSIS — R634 Abnormal weight loss: Secondary | ICD-10-CM | POA: Diagnosis not present

## 2020-08-09 DIAGNOSIS — I509 Heart failure, unspecified: Secondary | ICD-10-CM | POA: Diagnosis not present

## 2020-08-10 DIAGNOSIS — R630 Anorexia: Secondary | ICD-10-CM | POA: Diagnosis not present

## 2020-08-10 DIAGNOSIS — J90 Pleural effusion, not elsewhere classified: Secondary | ICD-10-CM | POA: Diagnosis not present

## 2020-08-10 DIAGNOSIS — I110 Hypertensive heart disease with heart failure: Secondary | ICD-10-CM | POA: Diagnosis not present

## 2020-08-10 DIAGNOSIS — I252 Old myocardial infarction: Secondary | ICD-10-CM | POA: Diagnosis not present

## 2020-08-10 DIAGNOSIS — R634 Abnormal weight loss: Secondary | ICD-10-CM | POA: Diagnosis not present

## 2020-08-10 DIAGNOSIS — I509 Heart failure, unspecified: Secondary | ICD-10-CM | POA: Diagnosis not present

## 2020-08-19 DEATH — deceased
